# Patient Record
Sex: Female | Born: 1935 | Race: Black or African American | Hispanic: No | Smoking: Never smoker
Health system: Southern US, Community
[De-identification: ages and names within clinical notes are randomized; demographics above are authoritative.]

## PROBLEM LIST (undated history)

## (undated) DIAGNOSIS — T8859XA Other complications of anesthesia, initial encounter: Secondary | ICD-10-CM

## (undated) DIAGNOSIS — E039 Hypothyroidism, unspecified: Secondary | ICD-10-CM

## (undated) DIAGNOSIS — E7800 Pure hypercholesterolemia, unspecified: Secondary | ICD-10-CM

## (undated) DIAGNOSIS — K449 Diaphragmatic hernia without obstruction or gangrene: Secondary | ICD-10-CM

## (undated) DIAGNOSIS — F419 Anxiety disorder, unspecified: Secondary | ICD-10-CM

## (undated) DIAGNOSIS — I10 Essential (primary) hypertension: Secondary | ICD-10-CM

## (undated) DIAGNOSIS — E079 Disorder of thyroid, unspecified: Secondary | ICD-10-CM

## (undated) DIAGNOSIS — Z9889 Other specified postprocedural states: Secondary | ICD-10-CM

## (undated) DIAGNOSIS — C50919 Malignant neoplasm of unspecified site of unspecified female breast: Secondary | ICD-10-CM

## (undated) DIAGNOSIS — M1990 Unspecified osteoarthritis, unspecified site: Secondary | ICD-10-CM

## (undated) DIAGNOSIS — R112 Nausea with vomiting, unspecified: Secondary | ICD-10-CM

## (undated) DIAGNOSIS — K5790 Diverticulosis of intestine, part unspecified, without perforation or abscess without bleeding: Secondary | ICD-10-CM

## (undated) DIAGNOSIS — T4145XA Adverse effect of unspecified anesthetic, initial encounter: Secondary | ICD-10-CM

## (undated) DIAGNOSIS — C55 Malignant neoplasm of uterus, part unspecified: Secondary | ICD-10-CM

## (undated) DIAGNOSIS — J42 Unspecified chronic bronchitis: Secondary | ICD-10-CM

## (undated) DIAGNOSIS — K219 Gastro-esophageal reflux disease without esophagitis: Secondary | ICD-10-CM

## (undated) DIAGNOSIS — J40 Bronchitis, not specified as acute or chronic: Secondary | ICD-10-CM

## (undated) HISTORY — PX: APPENDECTOMY: SHX54

## (undated) HISTORY — PX: ABDOMINAL HYSTERECTOMY: SHX81

## (undated) HISTORY — DX: Diverticulosis of intestine, part unspecified, without perforation or abscess without bleeding: K57.90

## (undated) HISTORY — DX: Unspecified osteoarthritis, unspecified site: M19.90

## (undated) HISTORY — DX: Disorder of thyroid, unspecified: E07.9

## (undated) HISTORY — PX: EYE SURGERY: SHX253

## (undated) HISTORY — PX: BREAST BIOPSY: SHX20

## (undated) HISTORY — PX: GLAUCOMA SURGERY: SHX656

## (undated) HISTORY — DX: Essential (primary) hypertension: I10

## (undated) HISTORY — PX: MASTECTOMY: SHX3

## (undated) HISTORY — DX: Diaphragmatic hernia without obstruction or gangrene: K44.9

---

## 2003-04-04 ENCOUNTER — Emergency Department (HOSPITAL_COMMUNITY): Admission: EM | Admit: 2003-04-04 | Discharge: 2003-04-04 | Payer: Self-pay | Admitting: Emergency Medicine

## 2003-09-10 ENCOUNTER — Inpatient Hospital Stay (HOSPITAL_COMMUNITY): Admission: RE | Admit: 2003-09-10 | Discharge: 2003-09-14 | Payer: Self-pay | Admitting: Orthopedic Surgery

## 2005-03-30 HISTORY — PX: TOTAL KNEE ARTHROPLASTY: SHX125

## 2009-07-06 ENCOUNTER — Inpatient Hospital Stay (HOSPITAL_COMMUNITY): Admission: EM | Admit: 2009-07-06 | Discharge: 2009-07-09 | Payer: Self-pay | Admitting: Emergency Medicine

## 2010-01-16 ENCOUNTER — Ambulatory Visit: Payer: Self-pay | Admitting: Vascular Surgery

## 2010-01-16 ENCOUNTER — Inpatient Hospital Stay (HOSPITAL_COMMUNITY): Admission: RE | Admit: 2010-01-16 | Discharge: 2010-01-21 | Payer: Self-pay | Admitting: Orthopedic Surgery

## 2010-01-16 HISTORY — PX: PATCH ANGIOPLASTY: SHX6230

## 2010-01-17 ENCOUNTER — Encounter (INDEPENDENT_AMBULATORY_CARE_PROVIDER_SITE_OTHER): Payer: Self-pay | Admitting: Orthopedic Surgery

## 2010-01-21 ENCOUNTER — Encounter (INDEPENDENT_AMBULATORY_CARE_PROVIDER_SITE_OTHER): Payer: Self-pay | Admitting: Orthopedic Surgery

## 2010-02-07 ENCOUNTER — Ambulatory Visit: Payer: Self-pay | Admitting: Vascular Surgery

## 2010-06-11 LAB — BASIC METABOLIC PANEL
BUN: 15 mg/dL (ref 6–23)
BUN: 16 mg/dL (ref 6–23)
BUN: 20 mg/dL (ref 6–23)
CO2: 29 mEq/L (ref 19–32)
CO2: 30 mEq/L (ref 19–32)
CO2: 31 mEq/L (ref 19–32)
Calcium: 7.3 mg/dL — ABNORMAL LOW (ref 8.4–10.5)
Calcium: 7.6 mg/dL — ABNORMAL LOW (ref 8.4–10.5)
Calcium: 7.9 mg/dL — ABNORMAL LOW (ref 8.4–10.5)
Chloride: 102 mEq/L (ref 96–112)
Chloride: 103 mEq/L (ref 96–112)
Chloride: 104 mEq/L (ref 96–112)
Creatinine, Ser: 1.16 mg/dL (ref 0.4–1.2)
Creatinine, Ser: 1.22 mg/dL — ABNORMAL HIGH (ref 0.4–1.2)
Creatinine, Ser: 1.38 mg/dL — ABNORMAL HIGH (ref 0.4–1.2)
GFR calc Af Amer: 44 mL/min — ABNORMAL LOW (ref >60)
GFR calc Af Amer: 51 mL/min — ABNORMAL LOW (ref >60)
GFR calc Af Amer: 54 mL/min — ABNORMAL LOW (ref >60)
GFR calc non Af Amer: 37 mL/min — ABNORMAL LOW (ref >60)
GFR calc non Af Amer: 42 mL/min — ABNORMAL LOW (ref >60)
GFR calc non Af Amer: 45 mL/min — ABNORMAL LOW (ref >60)
Glucose, Bld: 132 mg/dL — ABNORMAL HIGH (ref 70–99)
Glucose, Bld: 135 mg/dL — ABNORMAL HIGH (ref 70–99)
Glucose, Bld: 144 mg/dL — ABNORMAL HIGH (ref 70–99)
Potassium: 3.4 mEq/L — ABNORMAL LOW (ref 3.5–5.1)
Potassium: 3.7 mEq/L (ref 3.5–5.1)
Potassium: 3.9 mEq/L (ref 3.5–5.1)
Sodium: 135 mEq/L (ref 135–145)
Sodium: 135 mEq/L (ref 135–145)
Sodium: 139 mEq/L (ref 135–145)

## 2010-06-11 LAB — CROSSMATCH
ABO/RH(D): A POS
Antibody Screen: NEGATIVE
Unit division: 0
Unit division: 0
Unit division: 0
Unit division: 0

## 2010-06-11 LAB — URINALYSIS, ROUTINE W REFLEX MICROSCOPIC
Bilirubin Urine: NEGATIVE
Glucose, UA: NEGATIVE mg/dL
Hgb urine dipstick: NEGATIVE
Ketones, ur: NEGATIVE mg/dL
Nitrite: NEGATIVE
Protein, ur: NEGATIVE mg/dL
Specific Gravity, Urine: 1.016 (ref 1.005–1.030)
Urobilinogen, UA: 0.2 mg/dL (ref 0.0–1.0)
pH: 6 (ref 5.0–8.0)

## 2010-06-11 LAB — CBC
HCT: 24.5 % — ABNORMAL LOW (ref 36.0–46.0)
HCT: 28.6 % — ABNORMAL LOW (ref 36.0–46.0)
HCT: 28.9 % — ABNORMAL LOW (ref 36.0–46.0)
HCT: 32.2 % — ABNORMAL LOW (ref 36.0–46.0)
HCT: 42.6 % (ref 36.0–46.0)
Hemoglobin: 11.1 g/dL — ABNORMAL LOW (ref 12.0–15.0)
Hemoglobin: 14.7 g/dL (ref 12.0–15.0)
Hemoglobin: 8.5 g/dL — ABNORMAL LOW (ref 12.0–15.0)
Hemoglobin: 9.7 g/dL — ABNORMAL LOW (ref 12.0–15.0)
Hemoglobin: 9.8 g/dL — ABNORMAL LOW (ref 12.0–15.0)
MCH: 30.6 pg (ref 26.0–34.0)
MCH: 30.9 pg (ref 26.0–34.0)
MCH: 31.4 pg (ref 26.0–34.0)
MCH: 31.4 pg (ref 26.0–34.0)
MCH: 31.5 pg (ref 26.0–34.0)
MCHC: 33.8 g/dL (ref 30.0–36.0)
MCHC: 34 g/dL (ref 30.0–36.0)
MCHC: 34.4 g/dL (ref 30.0–36.0)
MCHC: 34.5 g/dL (ref 30.0–36.0)
MCHC: 34.6 g/dL (ref 30.0–36.0)
MCV: 89.7 fL (ref 78.0–100.0)
MCV: 90.4 fL (ref 78.0–100.0)
MCV: 90.9 fL (ref 78.0–100.0)
MCV: 91.3 fL (ref 78.0–100.0)
MCV: 92.2 fL (ref 78.0–100.0)
Platelets: 137 10*3/uL — ABNORMAL LOW (ref 150–400)
Platelets: 140 10*3/uL — ABNORMAL LOW (ref 150–400)
Platelets: 159 10*3/uL (ref 150–400)
Platelets: 172 10*3/uL (ref 150–400)
Platelets: 180 10*3/uL (ref 150–400)
RBC: 2.68 MIL/uL — ABNORMAL LOW (ref 3.87–5.11)
RBC: 3.1 MIL/uL — ABNORMAL LOW (ref 3.87–5.11)
RBC: 3.2 MIL/uL — ABNORMAL LOW (ref 3.87–5.11)
RBC: 3.55 MIL/uL — ABNORMAL LOW (ref 3.87–5.11)
RBC: 4.75 MIL/uL (ref 3.87–5.11)
RDW: 14.2 % (ref 11.5–15.5)
RDW: 14.5 % (ref 11.5–15.5)
RDW: 14.6 % (ref 11.5–15.5)
RDW: 14.8 % (ref 11.5–15.5)
RDW: 15.5 % (ref 11.5–15.5)
WBC: 5.7 10*3/uL (ref 4.0–10.5)
WBC: 6.8 10*3/uL (ref 4.0–10.5)
WBC: 7.4 10*3/uL (ref 4.0–10.5)
WBC: 7.4 10*3/uL (ref 4.0–10.5)
WBC: 8.8 10*3/uL (ref 4.0–10.5)

## 2010-06-11 LAB — URINALYSIS, MICROSCOPIC ONLY
Bilirubin Urine: NEGATIVE
Glucose, UA: NEGATIVE mg/dL
Hgb urine dipstick: NEGATIVE
Ketones, ur: NEGATIVE mg/dL
Leukocytes, UA: NEGATIVE
Nitrite: NEGATIVE
Protein, ur: NEGATIVE mg/dL
Specific Gravity, Urine: 1.017 (ref 1.005–1.030)
Urobilinogen, UA: 1 mg/dL (ref 0.0–1.0)
pH: 6 (ref 5.0–8.0)

## 2010-06-11 LAB — URINE MICROSCOPIC-ADD ON

## 2010-06-11 LAB — DIFFERENTIAL
Basophils Absolute: 0 10*3/uL (ref 0.0–0.1)
Basophils Relative: 0 % (ref 0–1)
Eosinophils Absolute: 0.1 10*3/uL (ref 0.0–0.7)
Eosinophils Relative: 1 % (ref 0–5)
Lymphocytes Relative: 25 % (ref 12–46)
Lymphs Abs: 1.4 10*3/uL (ref 0.7–4.0)
Monocytes Absolute: 0.4 10*3/uL (ref 0.1–1.0)
Monocytes Relative: 7 % (ref 3–12)
Neutro Abs: 3.8 10*3/uL (ref 1.7–7.7)
Neutrophils Relative %: 67 % (ref 43–77)

## 2010-06-11 LAB — SURGICAL PCR SCREEN
MRSA, PCR: NEGATIVE
Staphylococcus aureus: NEGATIVE

## 2010-06-11 LAB — PREPARE RBC (CROSSMATCH)

## 2010-06-11 LAB — COMPREHENSIVE METABOLIC PANEL
ALT: 16 U/L (ref 0–35)
AST: 18 U/L (ref 0–37)
Albumin: 3.6 g/dL (ref 3.5–5.2)
Alkaline Phosphatase: 78 U/L (ref 39–117)
BUN: 14 mg/dL (ref 6–23)
CO2: 30 mEq/L (ref 19–32)
Calcium: 9.3 mg/dL (ref 8.4–10.5)
Chloride: 104 mEq/L (ref 96–112)
Creatinine, Ser: 1.18 mg/dL (ref 0.4–1.2)
GFR calc Af Amer: 53 mL/min — ABNORMAL LOW (ref >60)
GFR calc non Af Amer: 44 mL/min — ABNORMAL LOW (ref >60)
Glucose, Bld: 102 mg/dL — ABNORMAL HIGH (ref 70–99)
Potassium: 3.7 mEq/L (ref 3.5–5.1)
Sodium: 140 mEq/L (ref 135–145)
Total Bilirubin: 0.4 mg/dL (ref 0.3–1.2)
Total Protein: 6.9 g/dL (ref 6.0–8.3)

## 2010-06-11 LAB — PROTIME-INR
INR: 0.93 (ref 0.00–1.49)
Prothrombin Time: 12.7 seconds (ref 11.6–15.2)

## 2010-06-18 LAB — COMPREHENSIVE METABOLIC PANEL
ALT: 13 U/L (ref 0–35)
AST: 16 U/L (ref 0–37)
Albumin: 3.4 g/dL — ABNORMAL LOW (ref 3.5–5.2)
Alkaline Phosphatase: 57 U/L (ref 39–117)
BUN: 25 mg/dL — ABNORMAL HIGH (ref 6–23)
CO2: 27 mEq/L (ref 19–32)
Calcium: 8.5 mg/dL (ref 8.4–10.5)
Chloride: 108 mEq/L (ref 96–112)
Creatinine, Ser: 0.97 mg/dL (ref 0.4–1.2)
GFR calc Af Amer: >60 mL/min (ref >60)
GFR calc non Af Amer: 55 mL/min — ABNORMAL LOW (ref >60)
Glucose, Bld: 131 mg/dL — ABNORMAL HIGH (ref 70–99)
Potassium: 3.9 mEq/L (ref 3.5–5.1)
Sodium: 138 mEq/L (ref 135–145)
Total Bilirubin: 0.7 mg/dL (ref 0.3–1.2)
Total Protein: 6 g/dL (ref 6.0–8.3)

## 2010-06-18 LAB — HEMOGLOBIN A1C
Hgb A1c MFr Bld: 6.6 % — ABNORMAL HIGH (ref 4.6–6.1)
Mean Plasma Glucose: 143 mg/dL

## 2010-06-18 LAB — CBC
HCT: 27 % — ABNORMAL LOW (ref 36.0–46.0)
HCT: 27.7 % — ABNORMAL LOW (ref 36.0–46.0)
HCT: 37.6 % (ref 36.0–46.0)
Hemoglobin: 12.6 g/dL (ref 12.0–15.0)
Hemoglobin: 9.4 g/dL — ABNORMAL LOW (ref 12.0–15.0)
MCHC: 33.6 g/dL (ref 30.0–36.0)
MCHC: 33.8 g/dL (ref 30.0–36.0)
MCHC: 33.9 g/dL (ref 30.0–36.0)
MCV: 93.9 fL (ref 78.0–100.0)
MCV: 94.4 fL (ref 78.0–100.0)
MCV: 95.1 fL (ref 78.0–100.0)
Platelets: 141 10*3/uL — ABNORMAL LOW (ref 150–400)
Platelets: 147 10*3/uL — ABNORMAL LOW (ref 150–400)
Platelets: 164 10*3/uL (ref 150–400)
Platelets: 201 10*3/uL (ref 150–400)
RBC: 3.37 MIL/uL — ABNORMAL LOW (ref 3.87–5.11)
RBC: 3.99 MIL/uL (ref 3.87–5.11)
RDW: 13 % (ref 11.5–15.5)
RDW: 13.1 % (ref 11.5–15.5)
RDW: 13.4 % (ref 11.5–15.5)
WBC: 5.4 10*3/uL (ref 4.0–10.5)
WBC: 6.3 10*3/uL (ref 4.0–10.5)
WBC: 6.6 10*3/uL (ref 4.0–10.5)

## 2010-06-18 LAB — DIFFERENTIAL
Basophils Absolute: 0 10*3/uL (ref 0.0–0.1)
Basophils Relative: 0 % (ref 0–1)
Eosinophils Absolute: 0 10*3/uL (ref 0.0–0.7)
Eosinophils Relative: 1 % (ref 0–5)
Lymphocytes Relative: 21 % (ref 12–46)
Lymphs Abs: 1.4 10*3/uL (ref 0.7–4.0)
Monocytes Absolute: 0.4 10*3/uL (ref 0.1–1.0)
Monocytes Relative: 6 % (ref 3–12)
Neutro Abs: 4.8 10*3/uL (ref 1.7–7.7)
Neutrophils Relative %: 72 % (ref 43–77)

## 2010-06-18 LAB — BASIC METABOLIC PANEL
BUN: 26 mg/dL — ABNORMAL HIGH (ref 6–23)
Calcium: 8.1 mg/dL — ABNORMAL LOW (ref 8.4–10.5)
Creatinine, Ser: 1.08 mg/dL (ref 0.4–1.2)
GFR calc Af Amer: 59 mL/min — ABNORMAL LOW (ref >60)

## 2010-06-18 LAB — TYPE AND SCREEN: ABO/RH(D): A POS

## 2010-06-18 LAB — HEMOGLOBIN AND HEMATOCRIT, BLOOD
HCT: 30.3 % — ABNORMAL LOW (ref 36.0–46.0)
HCT: 34.8 % — ABNORMAL LOW (ref 36.0–46.0)
Hemoglobin: 10 g/dL — ABNORMAL LOW (ref 12.0–15.0)
Hemoglobin: 10.1 g/dL — ABNORMAL LOW (ref 12.0–15.0)

## 2010-06-18 LAB — ABO/RH: ABO/RH(D): A POS

## 2010-06-18 LAB — MAGNESIUM: Magnesium: 2 mg/dL (ref 1.5–2.5)

## 2010-06-18 LAB — PREPARE RBC (CROSSMATCH)

## 2010-06-18 LAB — PROTIME-INR: Prothrombin Time: 13.8 seconds (ref 11.6–15.2)

## 2010-08-12 NOTE — Assessment & Plan Note (Signed)
OFFICE VISIT   [name], [name]  DOB:  1925-12-25                                       02/07/2010  CHART#:[id number]   This is a postop followup.   HISTORY OF PRESENT ILLNESS:  This is an 77 year old female that I did an  intraoperative consult on for an iatrogenic injury of the right  popliteal artery during a total knee replacement on the right side.  This required a patch angioplasty of the right popliteal artery.  Since  her previous surgery she has continued to proceed with inpatient rehab.  At this point she is able to ambulate with some assistance.  She never  has developed any type of claudication or rest pain symptoms during this  entirety of her hospitalization and rehab.  She has not noticed any  problems of temperature in the foot or any disturbance of sensation.   PHYSICAL EXAMINATION:  Vital signs:  Temperature 98.0, blood pressure  148/76, heart rate of 65, respirations were 12.  On targeted examination  she has an easily palpable popliteal pulse, dorsalis pedis pulse and  posterior tibial pulses, much weaker than the dorsalis pedis but still  is present.  There is edema throughout this right leg and postsurgical  changes.  She does have sensation in her right foot and has 5/5 plantar  flexion and dorsal flexion strength though this does cause her pain with  testing.   MEDICAL DECISION MAKING:  This is an 77 year old female now status post  a right popliteal artery repair with patch angioplasty in a setting of  iatrogenic injury during a total knee replacement.  At this point she  demonstrates good pedal pulses and no evidence of any residual problems  from the iatrogenic injury.  My plan is to have her follow up in 1 year  with ABIs and arterial duplex to verify patency of the popliteal artery  and also evaluate for any type of aneurysmal degeneration at the trauma  site.  I discussed this with the patient and she is amendable to such.  Additionally please note that done during her hospitalization she  already had postoperative ABIs completed which were normal.  So repeat  of ABIs in this time is not necessary.     Leonides Sake, MD  Electronically Signed   BC/MEDQ  D:  02/07/2010  T:  02/10/2010  Job:  [id number]
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
## 2010-08-15 NOTE — Op Note (Signed)
[name], [name]                           ACCOUNT NO.:  [id number]   MEDICAL RECORD NO.:  [id number]                   PATIENT TYPE:  INP   LOCATION:  0003                                 FACILITY:  [facility]   PHYSICIAN:  John L. Rendall III, M.D.           DATE OF BIRTH:  November 22, 1925   DATE OF PROCEDURE:  09/10/2003  DATE OF DISCHARGE:                                 OPERATIVE REPORT   PREOPERATIVE DIAGNOSIS:  Osteoarthritis, left knee.   SURGICAL PROCEDURE:  Left LCS total knee replacement, fully cemented.   POSTOPERATIVE DIAGNOSIS:  Osteoarthritis, left knee.   SURGEON:  John L. Rendall, M.D.   ASSISTANT:  Richardean Canal, P.A.   ANESTHESIA:  Spinal.   PATHOLOGY:  Bone against bone with a mild varus deformity, left knee.  Chronic pain, unremitting with conservative measures.   PROCEDURE:  Under spinal anesthesia, the left leg is prepared with Duraprep  and draped as a sterile field.  A proximal sterile tourniquet is used, the  leg is wrapped out with the Esmarch, and the tourniquet is used at 350 mm.  A midline incision is made, the patella is everted.  The femur is sized at a  standard.  Using the first tibial guide, a proximal tibial resection is  carried out.  Using the first femoral guide, an intercondylar drill hole is  placed.  Using the second guide, anterior and posterior flare of the distal  femur are resected with a 10 mm flexion gap.  Recessing guide was then used.  Following this a lamina spreader is inserted, remnants of the menisci and  cruciates are resected along with huge osteophytes on the back of the  femoral condyles.  Once this is all debrided and cleaned out, the tibia is  exposed with a McCale and Hohmann.  It is sized at a #3, a center peg hole  with keel is placed.  Trial reduction of a 3 tibia, 10 bearing, standard  femur reveals good fit and alignment.  The patella is osteotomized, a three  peg hole standard patella is used.  Towel clips are used to  close the medial  retinaculum.  The knee has good stability and excellent range of motion.  Permanent components are then obtained while bony surfaces are prepared with  pulse irrigation.  All components are cemented in place.  A synovectomy is  carried out in the suprapatellar pouch while cement hardens.  After cement  has hardened, excess is removed with osteotome and mallet.  Tourniquet is  let down at 54 minutes.  The knee is then closed in layers with #1 Tycron, 0  and 2-0 Vicryl, and skin clips.  Operative time approximately 1 hour 15  minutes.  The patient tolerated the procedure well and returned to recovery  in good condition.  John L. Dorothyann Gibbs, M.D.    Renato Gails  D:  09/10/2003  T:  09/10/2003  Job:  [id number]

## 2010-12-16 ENCOUNTER — Encounter: Payer: Self-pay | Admitting: Vascular Surgery

## 2011-01-17 HISTORY — PX: TOTAL KNEE ARTHROPLASTY: SHX125

## 2011-02-05 ENCOUNTER — Encounter: Payer: Self-pay | Admitting: Vascular Surgery

## 2011-02-06 ENCOUNTER — Ambulatory Visit: Payer: Self-pay | Admitting: Vascular Surgery

## 2011-03-19 ENCOUNTER — Encounter: Payer: Self-pay | Admitting: Vascular Surgery

## 2011-03-20 ENCOUNTER — Ambulatory Visit (INDEPENDENT_AMBULATORY_CARE_PROVIDER_SITE_OTHER): Payer: Medicare Other | Admitting: *Deleted

## 2011-03-20 ENCOUNTER — Ambulatory Visit: Payer: Medicare Other | Admitting: Vascular Surgery

## 2011-03-20 ENCOUNTER — Other Ambulatory Visit (INDEPENDENT_AMBULATORY_CARE_PROVIDER_SITE_OTHER): Payer: Medicare Other | Admitting: *Deleted

## 2011-03-20 DIAGNOSIS — I739 Peripheral vascular disease, unspecified: Secondary | ICD-10-CM

## 2011-03-20 DIAGNOSIS — Z48812 Encounter for surgical aftercare following surgery on the circulatory system: Secondary | ICD-10-CM

## 2011-03-26 NOTE — Procedures (Unsigned)
LOWER EXTREMITY ARTERIAL DUPLEX  INDICATION:  Follow up right popliteal artery repair.  HISTORY: Diabetes:  No Cardiac: Hypertension:  Yes Smoking:  No Previous Surgery:  Right popliteal artery repair 01/16/2010  SINGLE LEVEL ARTERIAL EXAM                         RIGHT                LEFT Brachial:               173                  170 Anterior tibial:        208                  145 Posterior tibial:       191                  206 Peroneal: Ankle/Brachial Index:   1.20                 1.19  LOWER EXTREMITY ARTERIAL DUPLEX EXAM  DUPLEX:  Patent right lower extremity popliteal artery with triphasic wave forms noted throughout.  IMPRESSION: 1. Bilateral ankle-brachial indices are within normal limits. 2. Patent right popliteal artery with velocity measurement shown on     the following worksheet.  ___________________________________________ Fransisco Hertz, MD  EM/MEDQ  D:  03/26/2011  T:  03/26/2011  Job:  [id number]

## 2012-01-06 ENCOUNTER — Encounter: Payer: Self-pay | Admitting: Vascular Surgery

## 2012-01-27 ENCOUNTER — Other Ambulatory Visit: Payer: Self-pay | Admitting: *Deleted

## 2012-01-27 DIAGNOSIS — I724 Aneurysm of artery of lower extremity: Secondary | ICD-10-CM

## 2012-01-27 DIAGNOSIS — I739 Peripheral vascular disease, unspecified: Secondary | ICD-10-CM

## 2012-01-27 DIAGNOSIS — Z48812 Encounter for surgical aftercare following surgery on the circulatory system: Secondary | ICD-10-CM

## 2012-01-29 ENCOUNTER — Ambulatory Visit: Payer: Medicare Other | Admitting: Neurosurgery

## 2012-02-05 ENCOUNTER — Encounter: Payer: Self-pay | Admitting: Neurosurgery

## 2012-02-08 ENCOUNTER — Ambulatory Visit: Payer: Medicare Other | Admitting: Neurosurgery

## 2012-03-03 ENCOUNTER — Other Ambulatory Visit (HOSPITAL_COMMUNITY): Payer: Self-pay | Admitting: Orthopaedic Surgery

## 2012-03-03 DIAGNOSIS — L089 Local infection of the skin and subcutaneous tissue, unspecified: Secondary | ICD-10-CM

## 2012-03-04 ENCOUNTER — Encounter (HOSPITAL_COMMUNITY): Payer: Self-pay | Admitting: Pharmacy Technician

## 2012-03-07 NOTE — Progress Notes (Addendum)
I was unable to reach on the phone.  I left a voice message on cell phone.  Arrive at 1030, [facility], NPO after Midnight.  Take Synthyroid with a sip of water.  May take Ultram if needed.  I called her contact person, there was no answer.

## 2012-03-07 NOTE — H&P (Signed)
[name], MD   Jacqualine Code, PA-C [address] [place], [place]  [address]                             [phone]   ORTHOPAEDIC HISTORY & PHYSICAL  [name] MRN:  [id number] DOB/SEX:  06/14/1925/female  CHIEF COMPLAINT:  Painful right total knee replacement  HISTORY: Patient is a 78 y.o. female presented with a history of pain in the right knee. She had a total knee replacement placed in October 2009 by Dr. Erasmo Leventhal.   She has had problems with motion with this at time.  She had last seen him in April 2012.  At that time she had been using tramadol intermittently for pain at least 2-3 times per day.  She, unfortunately at the time of her surgery, had an injury to the popliteal artery and it was evaluated by Dr. Imogene Burn from vascular surgery.  Apparently there was a hole in the lateral wall of the popliteal artery.  Dr. Imogene Burn and Dr. Myra Gianotti did a bovine patch graft.  She ended up, unfortunately, having arthrofibrosis postop and did have to have manipulation.  She, unfortunately, has never really gone past 80 or so degrees.  She states that overall she was tolerating this pain, however, on Sunday 02/28/2012 she was visiting a friend in the hospital and stood up and had pain in the posterior and lateral aspect of the knee.  There is no known injury other than getting up from a chair.  She notes some swelling in the area.  It also feels warm.  She has had difficulty weightbearing and has actually increased her tramadol.  She has pain even at nighttime when trying to turn over.  She denies any neurovascular compromise.  Evaluated on 12//06/2011 and an aspiration of the joint revealed very cloudy yellow fluid which was sent for studies as well as blood analysis.  They were abundant WBC in the gram stain, but no organisms were seen on day 1.  The C-met was normal except for a slightly elevated glucose at 105, creatinine was 1.17, BUN was 19.  The white cell count was 9,000.   Hemoglobin 14.9, hematocrit reported 1.9.  The C-reactive protein was significantly elevated at 14.  The sed rate was 58.   The fluid color was yellow, it was turbid.  There was 74,373 white cells, 86% were neutrophils. The glucose was <20 .  There were no crystals.  Cultures grew staph coag negative susceptible to everything PCN.  She continues to have pain.  Admitted for I&D with poly exchange and IV antibiotics,  PAST MEDICAL HISTORY: There are no active problems to display for this patient.  Past Medical History  Diagnosis Date  . Hypertension   . Thyroid disease   . Hiatal hernia   . Arthritis   . Diverticulosis   . Cancer     breast  . Cancer     Uterine   Past Surgical History  Procedure Date  . Total knee arthroplasty     left knee  . Popliteal artery repair 01/16/10    right with patch angioplasty     MEDICATIONS:   Prescriptions prior to admission  Medication Sig Dispense Refill  . ergocalciferol (VITAMIN D2) 50000 UNITS capsule Take 50,000 Units by mouth every 30 (thirty) days. Take on the 1st day of each month.      . indapamide (LOZOL) 1.25 MG  tablet Take 1.25 mg by mouth daily.      Marland Kitchen levothyroxine (SYNTHROID, LEVOTHROID) 50 MCG tablet Take 50 mcg by mouth daily.      . methocarbamol (ROBAXIN) 500 MG tablet Take 500 mg by mouth daily as needed. For cramps.      . Naproxen-Esomeprazole (VIMOVO) 500-20 MG TBEC Take 1 tablet by mouth daily.      . pravastatin (PRAVACHOL) 40 MG tablet Take 40 mg by mouth daily.      . traMADol (ULTRAM) 50 MG tablet Take 50 mg by mouth every 6 (six) hours as needed. For pain        ALLERGIES:  No Known Allergies  REVIEW OF SYSTEMS:  A comprehensive review of systems was negative except for: Eyes: positive for contacts/glasses Ears, nose, mouth, throat, and face: positive for upper and lower partial dentures Cardiovascular: positive for hypertension Endocrine: positive for hypothyroidism Breast cancer with mastectomy on the  left.   FAMILY HISTORY:  No family history on file.  SOCIAL HISTORY:   History  Substance Use Topics  . Smoking status: Never Smoker   . Smokeless tobacco: Not on file  . Alcohol Use: No      EXAMINATION: Vital signs in last 24 hours: Temp:  [98.5 F (36.9 C)] 98.5 F (36.9 C) (12/10 1108) Pulse Rate:  [63] 63  (12/10 1108) Resp:  [18] 18  (12/10 1108) BP: (134)/(68) 134/68 mmHg (12/10 1108) SpO2:  [95 %] 95 % (12/10 1108)  Head is normocephalic.   Eyes:  Pupils equal, round and reactive to light and accommodation.  Extraocular intact. ENT: Ears, nose, and throat were benign.   Neck: supple, no bruits were noted.   Chest: good expansion.   Lungs: essentially clear.   Cardiac: regular rhythm and rate, normal S1, S2.  No murmurs appreciated. Pulses :  1+ bilateral and symmetric in lower extremities. Abdomen is scaphoid, soft, nontender, no masses palpable, normal bowel sounds                  present. CNS:  He is oriented x3 and cranial nerves II-XII grossly intact. Breast, rectal, and genital exams: not performed and not indicated for an orthopedic evaluation. Musculoskeletal: Today she has only range of motion, near full extension, to about 80 degrees.  She is tender to palpation at the posterior aspect of the knee and along the lateral aspect of the knee.  Some medial pain, but more laterally.  She appears to be a little bit valgus at this time.  The surgical wound is well healed.  She has some warmth about the knee with no erythema.    Imaging Review X-rays reveal a cemented right total knee arthroplasty.  There may be some radiolucencies under the tibial plate along the medial aspect.     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 Hypertension   . Thyroid disease   . Hiatal hernia   . Arthritis   . Diverticulosis   . Cancer     breast  . Cancer     Uterine    PLAN: Plan for  right knee I&D, synovectomy and poly exchange.  The procedure,  risks, and benefits of total knee arthroplasty were presented and reviewed. The risks including but not limited to infection, blood clots, vascular and nerve injury, stiffness,  among others were discussed. The patient acknowledged the explanation, agreed to proceed.   PETRARCA,BRIAN 03/08/2012,  2:28 PM

## 2012-03-08 ENCOUNTER — Inpatient Hospital Stay (HOSPITAL_COMMUNITY): Payer: Medicare Other | Admitting: Certified Registered Nurse Anesthetist

## 2012-03-08 ENCOUNTER — Inpatient Hospital Stay (HOSPITAL_COMMUNITY): Payer: Medicare Other

## 2012-03-08 ENCOUNTER — Encounter (HOSPITAL_COMMUNITY): Payer: Self-pay | Admitting: Certified Registered Nurse Anesthetist

## 2012-03-08 ENCOUNTER — Encounter (HOSPITAL_COMMUNITY): Payer: Self-pay | Admitting: *Deleted

## 2012-03-08 ENCOUNTER — Encounter (HOSPITAL_COMMUNITY)
Admission: RE | Disposition: A | Discharge status: Skilled Nursing Facility | Payer: Self-pay | Source: Ambulatory Visit | Attending: Orthopaedic Surgery

## 2012-03-08 ENCOUNTER — Inpatient Hospital Stay (HOSPITAL_COMMUNITY)
Admission: RE | Admit: 2012-03-08 | Discharge: 2012-03-11 | DRG: 467 | Disposition: A | Discharge status: Skilled Nursing Facility | Payer: Medicare Other | Source: Ambulatory Visit | Attending: Orthopaedic Surgery | Admitting: Orthopaedic Surgery

## 2012-03-08 DIAGNOSIS — Z853 Personal history of malignant neoplasm of breast: Secondary | ICD-10-CM

## 2012-03-08 DIAGNOSIS — Z7901 Long term (current) use of anticoagulants: Secondary | ICD-10-CM

## 2012-03-08 DIAGNOSIS — T8459XA Infection and inflammatory reaction due to other internal joint prosthesis, initial encounter: Secondary | ICD-10-CM

## 2012-03-08 DIAGNOSIS — Z8542 Personal history of malignant neoplasm of other parts of uterus: Secondary | ICD-10-CM

## 2012-03-08 DIAGNOSIS — Z96659 Presence of unspecified artificial knee joint: Secondary | ICD-10-CM

## 2012-03-08 DIAGNOSIS — I10 Essential (primary) hypertension: Secondary | ICD-10-CM | POA: Diagnosis present

## 2012-03-08 DIAGNOSIS — Y92009 Unspecified place in unspecified non-institutional (private) residence as the place of occurrence of the external cause: Secondary | ICD-10-CM

## 2012-03-08 DIAGNOSIS — T8450XA Infection and inflammatory reaction due to unspecified internal joint prosthesis, initial encounter: Principal | ICD-10-CM | POA: Diagnosis present

## 2012-03-08 DIAGNOSIS — B957 Other staphylococcus as the cause of diseases classified elsewhere: Secondary | ICD-10-CM | POA: Diagnosis present

## 2012-03-08 DIAGNOSIS — E039 Hypothyroidism, unspecified: Secondary | ICD-10-CM | POA: Diagnosis present

## 2012-03-08 DIAGNOSIS — D62 Acute posthemorrhagic anemia: Secondary | ICD-10-CM | POA: Diagnosis not present

## 2012-03-08 DIAGNOSIS — Z79899 Other long term (current) drug therapy: Secondary | ICD-10-CM

## 2012-03-08 DIAGNOSIS — Y831 Surgical operation with implant of artificial internal device as the cause of abnormal reaction of the patient, or of later complication, without mention of misadventure at the time of the procedure: Secondary | ICD-10-CM | POA: Diagnosis present

## 2012-03-08 HISTORY — PX: JOINT REPLACEMENT: SHX530

## 2012-03-08 HISTORY — DX: Other complications of anesthesia, initial encounter: T88.59XA

## 2012-03-08 HISTORY — DX: Other specified postprocedural states: Z98.890

## 2012-03-08 HISTORY — DX: Hypothyroidism, unspecified: E03.9

## 2012-03-08 HISTORY — DX: Adverse effect of unspecified anesthetic, initial encounter: T41.45XA

## 2012-03-08 HISTORY — DX: Other specified postprocedural states: R11.2

## 2012-03-08 HISTORY — PX: I & D KNEE WITH POLY EXCHANGE: SHX5024

## 2012-03-08 LAB — CBC
Hemoglobin: 14.5 g/dL (ref 12.0–15.0)
MCV: 89 fL (ref 78.0–100.0)
Platelets: 280 10*3/uL (ref 150–400)
RBC: 4.63 MIL/uL (ref 3.87–5.11)
WBC: 8.8 10*3/uL (ref 4.0–10.5)

## 2012-03-08 LAB — URINALYSIS, ROUTINE W REFLEX MICROSCOPIC
Bilirubin Urine: NEGATIVE
Hgb urine dipstick: NEGATIVE
Specific Gravity, Urine: 1.011 (ref 1.005–1.030)
pH: 7 (ref 5.0–8.0)

## 2012-03-08 LAB — COMPREHENSIVE METABOLIC PANEL
ALT: 21 U/L (ref 0–35)
AST: 18 U/L (ref 0–37)
CO2: 29 mEq/L (ref 19–32)
Chloride: 97 mEq/L (ref 96–112)
Creatinine, Ser: 1 mg/dL (ref 0.50–1.10)
GFR calc non Af Amer: 50 mL/min — ABNORMAL LOW (ref >90)
Total Bilirubin: 0.3 mg/dL (ref 0.3–1.2)

## 2012-03-08 LAB — PROTIME-INR: INR: 1.16 (ref 0.00–1.49)

## 2012-03-08 LAB — TYPE AND SCREEN: Antibody Screen: NEGATIVE

## 2012-03-08 LAB — ABO/RH: ABO/RH(D): A POS

## 2012-03-08 SURGERY — IRRIGATION AND DEBRIDEMENT KNEE WITH POLY EXCHANGE
Anesthesia: General | Site: Knee | Laterality: Right | Wound class: Dirty or Infected

## 2012-03-08 MED ORDER — CEFAZOLIN SODIUM 1-5 GM-% IV SOLN
INTRAVENOUS | Status: AC
  Filled 2012-03-08: qty 150

## 2012-03-08 MED ORDER — GENTAMICIN SULFATE 40 MG/ML IJ SOLN
INTRAMUSCULAR | Status: AC
  Filled 2012-03-08: qty 4

## 2012-03-08 MED ORDER — ACETAMINOPHEN 10 MG/ML IV SOLN
1000.0000 mg | Freq: Four times a day (QID) | INTRAVENOUS | Status: AC
  Administered 2012-03-08 – 2012-03-09 (×4): 1000 mg via INTRAVENOUS
  Filled 2012-03-08 (×4): qty 100

## 2012-03-08 MED ORDER — BISACODYL 10 MG RE SUPP
10.0000 mg | Freq: Every day | RECTAL | Status: DC | PRN
  Administered 2012-03-11: 10 mg via RECTAL
  Filled 2012-03-08: qty 1

## 2012-03-08 MED ORDER — METHOCARBAMOL 500 MG PO TABS
500.0000 mg | ORAL_TABLET | Freq: Four times a day (QID) | ORAL | Status: DC | PRN
  Administered 2012-03-09 – 2012-03-11 (×10): 500 mg via ORAL
  Filled 2012-03-08 (×11): qty 1

## 2012-03-08 MED ORDER — HYDROMORPHONE HCL PF 1 MG/ML IJ SOLN
0.5000 mg | INTRAMUSCULAR | Status: DC | PRN
  Administered 2012-03-09 (×4): 0.5 mg via INTRAVENOUS
  Filled 2012-03-08 (×5): qty 1

## 2012-03-08 MED ORDER — SODIUM CHLORIDE 0.9 % IR SOLN
Status: DC | PRN
  Administered 2012-03-08: 3000 mL

## 2012-03-08 MED ORDER — SODIUM CHLORIDE 0.9 % IV SOLN
75.0000 mL/h | INTRAVENOUS | Status: DC
  Administered 2012-03-08: 75 mL/h via INTRAVENOUS

## 2012-03-08 MED ORDER — VANCOMYCIN HCL 500 MG IV SOLR
INTRAVENOUS | Status: AC
  Filled 2012-03-08: qty 500

## 2012-03-08 MED ORDER — MAGNESIUM HYDROXIDE 400 MG/5ML PO SUSP: 30.0000 mL | Freq: Every day | ORAL | Status: DC | PRN

## 2012-03-08 MED ORDER — ONDANSETRON HCL 4 MG/2ML IJ SOLN
INTRAMUSCULAR | Status: DC | PRN
  Administered 2012-03-08: 4 mg via INTRAVENOUS

## 2012-03-08 MED ORDER — LEVOTHYROXINE SODIUM 50 MCG PO TABS
50.0000 ug | ORAL_TABLET | Freq: Every day | ORAL | Status: DC
  Administered 2012-03-09 – 2012-03-11 (×3): 50 ug via ORAL
  Filled 2012-03-08 (×4): qty 1

## 2012-03-08 MED ORDER — METHOCARBAMOL 100 MG/ML IJ SOLN
500.0000 mg | Freq: Four times a day (QID) | INTRAVENOUS | Status: DC | PRN
  Administered 2012-03-08: 500 mg via INTRAVENOUS
  Filled 2012-03-08: qty 5

## 2012-03-08 MED ORDER — ACETAMINOPHEN 10 MG/ML IV SOLN
INTRAVENOUS | Status: AC
  Filled 2012-03-08: qty 100

## 2012-03-08 MED ORDER — VANCOMYCIN HCL 500 MG IV SOLR
INTRAVENOUS | Status: DC | PRN
  Administered 2012-03-08: 500 mg

## 2012-03-08 MED ORDER — MUPIROCIN 2 % EX OINT
TOPICAL_OINTMENT | Freq: Two times a day (BID) | CUTANEOUS | Status: DC
  Administered 2012-03-08 – 2012-03-11 (×5): via NASAL
  Filled 2012-03-08: qty 22

## 2012-03-08 MED ORDER — ARTIFICIAL TEARS OP OINT
TOPICAL_OINTMENT | OPHTHALMIC | Status: DC | PRN
  Administered 2012-03-08: 1 via OPHTHALMIC

## 2012-03-08 MED ORDER — RIVAROXABAN 10 MG PO TABS
10.0000 mg | ORAL_TABLET | ORAL | Status: DC
  Administered 2012-03-09: 10 mg via ORAL
  Filled 2012-03-08 (×2): qty 1

## 2012-03-08 MED ORDER — LACTATED RINGERS IV SOLN
INTRAVENOUS | Status: DC | PRN
  Administered 2012-03-08 (×2): via INTRAVENOUS

## 2012-03-08 MED ORDER — LACTATED RINGERS IV SOLN
INTRAVENOUS | Status: DC
  Administered 2012-03-08: 14:00:00 via INTRAVENOUS

## 2012-03-08 MED ORDER — EPHEDRINE SULFATE 50 MG/ML IJ SOLN
INTRAMUSCULAR | Status: DC | PRN
  Administered 2012-03-08: 5 mg via INTRAVENOUS

## 2012-03-08 MED ORDER — MUPIROCIN 2 % EX OINT
TOPICAL_OINTMENT | CUTANEOUS | Status: AC
  Filled 2012-03-08: qty 22

## 2012-03-08 MED ORDER — FLEET ENEMA 7-19 GM/118ML RE ENEM: 1.0000 | ENEMA | Freq: Once | RECTAL | Status: AC | PRN

## 2012-03-08 MED ORDER — HYDROMORPHONE HCL PF 1 MG/ML IJ SOLN
INTRAMUSCULAR | Status: AC
  Administered 2012-03-08: 0.25 mg via INTRAVENOUS
  Filled 2012-03-08: qty 1

## 2012-03-08 MED ORDER — DOCUSATE SODIUM 100 MG PO CAPS
100.0000 mg | ORAL_CAPSULE | Freq: Two times a day (BID) | ORAL | Status: DC
  Administered 2012-03-09 – 2012-03-11 (×5): 100 mg via ORAL
  Filled 2012-03-08 (×7): qty 1

## 2012-03-08 MED ORDER — 0.9 % SODIUM CHLORIDE (POUR BTL) OPTIME
TOPICAL | Status: DC | PRN
  Administered 2012-03-08: 1000 mL

## 2012-03-08 MED ORDER — ONDANSETRON HCL 4 MG/2ML IJ SOLN: 4.0000 mg | Freq: Four times a day (QID) | INTRAMUSCULAR | Status: DC | PRN

## 2012-03-08 MED ORDER — SODIUM CHLORIDE 0.9 % IV SOLN: INTRAVENOUS | Status: DC

## 2012-03-08 MED ORDER — GLYCOPYRROLATE 0.2 MG/ML IJ SOLN
INTRAMUSCULAR | Status: DC | PRN
  Administered 2012-03-08 (×2): 0.1 mg via INTRAVENOUS

## 2012-03-08 MED ORDER — KETOROLAC TROMETHAMINE 15 MG/ML IJ SOLN
15.0000 mg | Freq: Four times a day (QID) | INTRAMUSCULAR | Status: AC
  Administered 2012-03-08 – 2012-03-09 (×2): 15 mg via INTRAVENOUS
  Filled 2012-03-08 (×2): qty 1

## 2012-03-08 MED ORDER — INDAPAMIDE 1.25 MG PO TABS
1.2500 mg | ORAL_TABLET | Freq: Every day | ORAL | Status: DC
  Administered 2012-03-08 – 2012-03-11 (×4): 1.25 mg via ORAL
  Filled 2012-03-08 (×4): qty 1

## 2012-03-08 MED ORDER — CEFAZOLIN SODIUM-DEXTROSE 2-3 GM-% IV SOLR
2.0000 g | Freq: Three times a day (TID) | INTRAVENOUS | Status: AC
  Administered 2012-03-09 – 2012-03-10 (×6): 2 g via INTRAVENOUS
  Filled 2012-03-08 (×6): qty 50

## 2012-03-08 MED ORDER — HYDROMORPHONE HCL PF 1 MG/ML IJ SOLN
0.2500 mg | INTRAMUSCULAR | Status: DC | PRN
  Administered 2012-03-08 (×2): 0.25 mg via INTRAVENOUS
  Administered 2012-03-08: 0.5 mg via INTRAVENOUS

## 2012-03-08 MED ORDER — METOCLOPRAMIDE HCL 10 MG PO TABS: 5.0000 mg | ORAL_TABLET | Freq: Three times a day (TID) | ORAL | Status: DC | PRN

## 2012-03-08 MED ORDER — THROMBIN 20000 UNITS EX KIT
PACK | CUTANEOUS | Status: AC
  Filled 2012-03-08: qty 1

## 2012-03-08 MED ORDER — FENTANYL CITRATE 0.05 MG/ML IJ SOLN
INTRAMUSCULAR | Status: DC | PRN
  Administered 2012-03-08 (×2): 25 ug via INTRAVENOUS
  Administered 2012-03-08: 100 ug via INTRAVENOUS
  Administered 2012-03-08 (×4): 25 ug via INTRAVENOUS
  Administered 2012-03-08: 100 ug via INTRAVENOUS
  Administered 2012-03-08 (×3): 50 ug via INTRAVENOUS

## 2012-03-08 MED ORDER — LIDOCAINE HCL (CARDIAC) 20 MG/ML IV SOLN
INTRAVENOUS | Status: DC | PRN
  Administered 2012-03-08: 100 mg via INTRAVENOUS

## 2012-03-08 MED ORDER — BUPIVACAINE-EPINEPHRINE (PF) 0.5% -1:200000 IJ SOLN
INTRAMUSCULAR | Status: AC
  Filled 2012-03-08: qty 10

## 2012-03-08 MED ORDER — PROPOFOL 10 MG/ML IV BOLUS
INTRAVENOUS | Status: DC | PRN
  Administered 2012-03-08: 120 mg via INTRAVENOUS

## 2012-03-08 MED ORDER — GENTAMICIN SULFATE 40 MG/ML IJ SOLN
INTRAMUSCULAR | Status: DC | PRN
  Administered 2012-03-08: 80 mg via INTRAMUSCULAR

## 2012-03-08 MED ORDER — OXYCODONE HCL 5 MG PO TABS
5.0000 mg | ORAL_TABLET | ORAL | Status: DC | PRN
  Administered 2012-03-08 – 2012-03-11 (×18): 10 mg via ORAL
  Filled 2012-03-08 (×18): qty 2

## 2012-03-08 MED ORDER — ALUM & MAG HYDROXIDE-SIMETH 200-200-20 MG/5ML PO SUSP: 30.0000 mL | ORAL | Status: DC | PRN

## 2012-03-08 MED ORDER — MENTHOL 3 MG MT LOZG: 1.0000 | LOZENGE | OROMUCOSAL | Status: DC | PRN

## 2012-03-08 MED ORDER — PHENOL 1.4 % MT LIQD: 1.0000 | OROMUCOSAL | Status: DC | PRN

## 2012-03-08 MED ORDER — CEFAZOLIN SODIUM 1-5 GM-% IV SOLN
INTRAVENOUS | Status: DC | PRN
  Administered 2012-03-08 (×3): 1 g via INTRAVENOUS

## 2012-03-08 MED ORDER — ACETAMINOPHEN 10 MG/ML IV SOLN
1000.0000 mg | Freq: Once | INTRAVENOUS | Status: AC
  Administered 2012-03-08: 1000 mg via INTRAVENOUS
  Filled 2012-03-08: qty 100

## 2012-03-08 MED ORDER — METOCLOPRAMIDE HCL 5 MG/ML IJ SOLN: 5.0000 mg | Freq: Three times a day (TID) | INTRAMUSCULAR | Status: DC | PRN

## 2012-03-08 MED ORDER — ONDANSETRON HCL 4 MG PO TABS
4.0000 mg | ORAL_TABLET | Freq: Four times a day (QID) | ORAL | Status: DC | PRN
  Administered 2012-03-09: 4 mg via ORAL
  Filled 2012-03-08: qty 1

## 2012-03-08 MED ORDER — BUPIVACAINE-EPINEPHRINE PF 0.5-1:200000 % IJ SOLN
INTRAMUSCULAR | Status: DC | PRN
  Administered 2012-03-08: 30 mL

## 2012-03-08 SURGICAL SUPPLY — 49 items
BAG DECANTER FOR FLEXI CONT (MISCELLANEOUS) ×1 IMPLANT
BANDAGE ELASTIC 4 VELCRO ST LF (GAUZE/BANDAGES/DRESSINGS) ×1 IMPLANT
BANDAGE GAUZE ELAST BULKY 4 IN (GAUZE/BANDAGES/DRESSINGS) ×1 IMPLANT
BNDG COHESIVE 4X5 TAN STRL (GAUZE/BANDAGES/DRESSINGS) ×1 IMPLANT
CLOTH BEACON ORANGE TIMEOUT ST (SAFETY) ×2 IMPLANT
CUFF TOURNIQUET SINGLE 34IN LL (TOURNIQUET CUFF) ×1 IMPLANT
CUFF TOURNIQUET SINGLE 44IN (TOURNIQUET CUFF) IMPLANT
DRSG ADAPTIC 3X8 NADH LF (GAUZE/BANDAGES/DRESSINGS) ×2 IMPLANT
DRSG EMULSION OIL 3X3 NADH (GAUZE/BANDAGES/DRESSINGS) ×1 IMPLANT
DRSG PAD ABDOMINAL 8X10 ST (GAUZE/BANDAGES/DRESSINGS) ×3 IMPLANT
DURAPREP 26ML APPLICATOR (WOUND CARE) ×2 IMPLANT
ELECT REM PT RETURN 9FT ADLT (ELECTROSURGICAL) ×2
ELECTRODE REM PT RTRN 9FT ADLT (ELECTROSURGICAL) IMPLANT
EVACUATOR 1/8 PVC DRAIN (DRAIN) ×1 IMPLANT
GLOVE BIOGEL PI IND STRL 8 (GLOVE) ×1 IMPLANT
GLOVE BIOGEL PI INDICATOR 8 (GLOVE) ×1
GLOVE ECLIPSE 8.0 STRL XLNG CF (GLOVE) ×2 IMPLANT
GOWN STRL NON-REIN LRG LVL3 (GOWN DISPOSABLE) ×4 IMPLANT
HANDPIECE INTERPULSE COAX TIP (DISPOSABLE) ×2
INSERT LCS 3 PEG STD (Knees) ×1 IMPLANT
INSERT TIB LCS RP STD+ 12.5 (Knees) ×1 IMPLANT
KIT BASIN OR (CUSTOM PROCEDURE TRAY) ×2 IMPLANT
KIT ROOM TURNOVER OR (KITS) ×2 IMPLANT
KIT STIMULAN RAPID CURE 5CC (Orthopedic Implant) ×1 IMPLANT
MANIFOLD NEPTUNE II (INSTRUMENTS) ×2 IMPLANT
NS IRRIG 1000ML POUR BTL (IV SOLUTION) ×2 IMPLANT
PACK ORTHO EXTREMITY (CUSTOM PROCEDURE TRAY) ×1 IMPLANT
PACK TOTAL JOINT (CUSTOM PROCEDURE TRAY) ×1 IMPLANT
PAD ARMBOARD 7.5X6 YLW CONV (MISCELLANEOUS) ×4 IMPLANT
PAD CAST 4YDX4 CTTN HI CHSV (CAST SUPPLIES) IMPLANT
PADDING CAST COTTON 4X4 STRL (CAST SUPPLIES) ×2
PADDING CAST COTTON 6X4 STRL (CAST SUPPLIES) ×1 IMPLANT
PENCIL BUTTON HOLSTER BLD 10FT (ELECTRODE) IMPLANT
RUBBERBAND STERILE (MISCELLANEOUS) ×2 IMPLANT
SET HNDPC FAN SPRY TIP SCT (DISPOSABLE) IMPLANT
SPONGE GAUZE 4X4 12PLY (GAUZE/BANDAGES/DRESSINGS) ×2 IMPLANT
SPONGE LAP 18X18 X RAY DECT (DISPOSABLE) ×1 IMPLANT
SPONGE LAP 4X18 X RAY DECT (DISPOSABLE) ×1 IMPLANT
STOCKINETTE IMPERVIOUS 9X36 MD (GAUZE/BANDAGES/DRESSINGS) ×1 IMPLANT
SUT ETHILON 3 0 PS 1 (SUTURE) IMPLANT
SUT PDS AB 1 CT  36 (SUTURE) ×3
SUT PDS AB 1 CT 36 (SUTURE) IMPLANT
TOWEL OR 17X24 6PK STRL BLUE (TOWEL DISPOSABLE) ×2 IMPLANT
TOWEL OR 17X26 10 PK STRL BLUE (TOWEL DISPOSABLE) ×2 IMPLANT
TUBE ANAEROBIC SPECIMEN COL (MISCELLANEOUS) ×1 IMPLANT
TUBE CONNECTING 12X1/4 (SUCTIONS) ×1 IMPLANT
UNDERPAD 30X30 INCONTINENT (UNDERPADS AND DIAPERS) ×2 IMPLANT
WATER STERILE IRR 1000ML POUR (IV SOLUTION) ×1 IMPLANT
YANKAUER SUCT BULB TIP NO VENT (SUCTIONS) ×1 IMPLANT

## 2012-03-08 NOTE — Preoperative (Signed)
Beta Blockers   Reason not to administer Beta Blockers:Not Applicable 

## 2012-03-08 NOTE — Anesthesia Procedure Notes (Signed)
Procedure Name: LMA Insertion Date/Time: 03/08/2012 3:04 PM Performed by: Margaree Mackintosh Pre-anesthesia Checklist: Patient identified, Timeout performed, Emergency Drugs available, Suction available and Patient being monitored Patient Re-evaluated:Patient Re-evaluated prior to inductionOxygen Delivery Method: Circle system utilized Preoxygenation: Pre-oxygenation with 100% oxygen Intubation Type: IV induction LMA: LMA inserted LMA Size: 4.0 Number of attempts: 1 Placement Confirmation: positive ETCO2 and breath sounds checked- equal and bilateral Tube secured with: Tape Dental Injury: Teeth and Oropharynx as per pre-operative assessment

## 2012-03-08 NOTE — Anesthesia Postprocedure Evaluation (Signed)
  Anesthesia Post-op Note  Patient: [name]  Procedure(s) Performed: Procedure(s) (LRB) with comments: IRRIGATION AND DEBRIDEMENT KNEE WITH POLY EXCHANGE (Right) - Right Total Knee Irrigation & Debridement, Synovectomy, Poly Exchange, Placement of Antibiotic Beads  Patient Location: PACU  Anesthesia Type:GA combined with regional for post-op pain  Level of Consciousness: awake  Airway and Oxygen Therapy: Patient Spontanous Breathing  Post-op Pain: mild  Post-op Assessment: Post-op Vital signs reviewed, Patient's Cardiovascular Status Stable, Respiratory Function Stable, Patent Airway, No signs of Nausea or vomiting and Pain level controlled  Post-op Vital Signs: stable  Complications: No apparent anesthesia complications

## 2012-03-08 NOTE — Anesthesia Preprocedure Evaluation (Signed)
Anesthesia Evaluation  Patient identified by MRN, date of birth, ID band Patient awake    Reviewed: Allergy & Precautions, H&P , NPO status , Patient's Chart, lab work & pertinent test results  Airway Mallampati: II      Dental   Pulmonary  breath sounds clear to auscultation        Cardiovascular hypertension, Pt. on medications Rhythm:Regular Rate:Normal     Neuro/Psych    GI/Hepatic Neg liver ROS,   Endo/Other  negative endocrine ROS  Renal/GU negative Renal ROS     Musculoskeletal   Abdominal   Peds  Hematology negative hematology ROS (+)   Anesthesia Other Findings   Reproductive/Obstetrics                           Anesthesia Physical Anesthesia Plan  ASA: III  Anesthesia Plan: General   Post-op Pain Management:    Induction: Intravenous  Airway Management Planned: Oral ETT  Additional Equipment:   Intra-op Plan:   Post-operative Plan: Extubation in OR  Informed Consent:   Plan Discussed with: CRNA, Anesthesiologist and Surgeon  Anesthesia Plan Comments:         Anesthesia Quick Evaluation

## 2012-03-08 NOTE — Progress Notes (Signed)
Patient ID: [name], female   DOB: 09-27-1925, 78 y.o.   MRN: [id number] The recent History & Physical has been reviewed. I have personally examined the patient today. There is no interval change to the documented History & Physical. The patient would like to proceed with the procedure.  Antwian Santaana W 03/08/2012,  2:16 PM

## 2012-03-08 NOTE — Op Note (Signed)
PATIENT ID:      [name]  MRN:     [id number] DOB/AGE:    January 04, 1926 / 78 y.o.       OPERATIVE REPORT    DATE OF PROCEDURE:  03/08/2012       PREOPERATIVE DIAGNOSIS:   infected right total knee with staph coagulase negative bacteria                                                       There is no height or weight on file to calculate BMI.     POSTOPERATIVE DIAGNOSIS:   infected right total knee with staph coagulase negative bacteria                                                                     There is no height or weight on file to calculate BMI.     PROCEDURE:  Procedure(s): IRRIGATION AND DEBRIDEMENT KNEE WITH POLY EXCHANGE right     SURGEON:  [name], MD    ASSISTANT:   Jacqualine Code, PA-C   (Present and scrubbed throughout the case, critical for assistance with exposure, retraction, instrumentation, and closure.)          ANESTHESIA: general     DRAINS: (right knee) Hemovact drain(s) in the open with  Suction Open :      TOURNIQUET TIME:  Total Tourniquet Time Documented: Thigh (Right) - 61 minutes    COMPLICATIONS:  None   CONDITION:  stable  PROCEDURE IN DETAIL: [id number]   [name] W 03/08/2012, 4:54 PM

## 2012-03-08 NOTE — Transfer of Care (Signed)
Immediate Anesthesia Transfer of Care Note  Patient: [name]  Procedure(s) Performed: Procedure(s) (LRB) with comments: IRRIGATION AND DEBRIDEMENT KNEE WITH POLY EXCHANGE (Right) - Right Total Knee Irrigation & Debridement, Synovectomy, Poly Exchange, Placement of Antibiotic Beads  Patient Location: PACU  Anesthesia Type:General  Level of Consciousness: awake and alert   Airway & Oxygen Therapy: Patient Spontanous Breathing and Patient connected to nasal cannula oxygen  Post-op Assessment: Report given to PACU RN, Post -op Vital signs reviewed and stable and Patient moving all extremities  Post vital signs: Reviewed and stable  Complications: No apparent anesthesia complications

## 2012-03-08 NOTE — Progress Notes (Signed)
Orthopedic Tech Progress Note Patient Details:  Princes Finger September 26, 1925 [id number] Start time 8:05 pm. CPM Right Knee CPM Right Knee: On Right Knee Flexion (Degrees): 60  Right Knee Extension (Degrees): 0  Additional Comments: applied overhead frame   [name] 03/08/2012, 8:53 PM

## 2012-03-09 ENCOUNTER — Encounter: Payer: Self-pay | Admitting: Neurosurgery

## 2012-03-09 ENCOUNTER — Encounter (HOSPITAL_COMMUNITY): Payer: Self-pay | Admitting: Orthopaedic Surgery

## 2012-03-09 DIAGNOSIS — T8450XA Infection and inflammatory reaction due to unspecified internal joint prosthesis, initial encounter: Secondary | ICD-10-CM

## 2012-03-09 DIAGNOSIS — Y831 Surgical operation with implant of artificial internal device as the cause of abnormal reaction of the patient, or of later complication, without mention of misadventure at the time of the procedure: Secondary | ICD-10-CM

## 2012-03-09 LAB — URINE CULTURE

## 2012-03-09 LAB — CBC
HCT: 36.3 % (ref 36.0–46.0)
MCHC: 33.9 g/dL (ref 30.0–36.0)
Platelets: 254 10*3/uL (ref 150–400)
RDW: 13 % (ref 11.5–15.5)
WBC: 6.8 10*3/uL (ref 4.0–10.5)

## 2012-03-09 LAB — BASIC METABOLIC PANEL
BUN: 14 mg/dL (ref 6–23)
Chloride: 102 mEq/L (ref 96–112)
GFR calc Af Amer: 54 mL/min — ABNORMAL LOW (ref >90)
GFR calc non Af Amer: 46 mL/min — ABNORMAL LOW (ref >90)
Potassium: 4 mEq/L (ref 3.5–5.1)
Sodium: 137 mEq/L (ref 135–145)

## 2012-03-09 MED ORDER — RIFAMPIN 300 MG PO CAPS
300.0000 mg | ORAL_CAPSULE | Freq: Two times a day (BID) | ORAL | Status: DC
  Administered 2012-03-10 – 2012-03-11 (×2): 300 mg via ORAL
  Filled 2012-03-09 (×7): qty 1

## 2012-03-09 MED ORDER — ENOXAPARIN SODIUM 30 MG/0.3ML ~~LOC~~ SOLN
30.0000 mg | Freq: Two times a day (BID) | SUBCUTANEOUS | Status: DC
  Administered 2012-03-09 – 2012-03-11 (×4): 30 mg via SUBCUTANEOUS
  Filled 2012-03-09 (×6): qty 0.3

## 2012-03-09 NOTE — Progress Notes (Signed)
Patient ID: [name], female   DOB: 01/31/1926, 78 y.o.   MRN: [id number] PATIENT ID: [name]        MRN:  [id number]          DOB/AGE: 1926-03-22 / 78 y.o.    Megan Campbell, MD   Jacqualine Code, PA-C [address] [place], [place]  [address]                             [phone]   PROGRESS NOTE  Subjective:  negative for Chest Pain  negative for Shortness of Breath  negative for Nausea/Vomiting   negative for Calf Pain    Tolerating Diet: yes         Patient reports pain as moderate.     Awake, alert, good night  Objective: Vital signs in last 24 hours:   Patient Vitals for the past 24 hrs:  BP Temp Temp src Pulse Resp SpO2 Height Weight  03/09/12 0549 145/62 mmHg 98.8 F (37.1 C) - 56  16  96 % - -  03/09/12 0400 - - - - 16  96 % - -  03/09/12 0200 134/53 mmHg 98.3 F (36.8 C) - 68  16  96 % - -  03/08/12 2322 - - - - 18  100 % - -  03/08/12 1950 151/56 mmHg 98.1 F (36.7 C) - 66  16  100 % 5\' 4"  (1.626 m) 78.019 kg (172 lb)  03/08/12 1930 158/62 mmHg - - 62  14  99 % - -  03/08/12 1915 176/66 mmHg - - 58  10  100 % - -  03/08/12 1900 179/64 mmHg 97.6 F (36.4 C) - 58  12  100 % - -  03/08/12 1845 173/61 mmHg - - 58  11  99 % - -  03/08/12 1830 185/66 mmHg - - 62  17  100 % - -  03/08/12 1815 176/65 mmHg - - 59  15  99 % - -  03/08/12 1800 183/72 mmHg - - 65  13  99 % - -  03/08/12 1745 184/68 mmHg - - 68  17  100 % - -  03/08/12 1730 169/73 mmHg - - 81  17  100 % - -  03/08/12 1723 169/73 mmHg 98.1 F (36.7 C) - 82  12  100 % - -  03/08/12 1108 134/68 mmHg 98.5 F (36.9 C) Oral 63  18  95 % - -      Intake/Output from previous day:   12/10 0701 - 12/11 0700 In: 3183.8 [P.O.:240; I.V.:2293.8] Out: 1360 [Urine:1050; Drains:260]   Intake/Output this shift:       Intake/Output      12/10 0701 - 12/11 0700 12/11 0701 - 12/12 0700   P.O. 240    I.V. (mL/kg) 2293.8 (29.4)    IV Piggyback 650    Total Intake(mL/kg) 3183.8 (40.8)    Urine  (mL/kg/hr) 1050 (0.6)    Drains 260    Blood 50    Total Output 1360    Net +1823.8            LABORATORY DATA:  Basename 03/09/12 0520 03/08/12 1102  WBC 6.8 8.8  HGB 12.3 14.5  HCT 36.3 41.2  PLT 254 280    Basename 03/09/12 0520 03/08/12 1102  NA 137 137  K 4.0 3.7  CL 102 97  CO2 27 29  BUN 14 17  CREATININE 1.07 1.00  GLUCOSE 80 98  CALCIUM 8.8 9.5   Lab Results  Component Value Date   INR 1.16 03/08/2012   INR 0.93 01/10/2010   INR 1.07 07/06/2009    Recent Radiographic Studies :   Chest 2 View  03/08/2012  *RADIOLOGY REPORT*  Clinical Data: Preop right total knee revision  CHEST - 2 VIEW  Comparison: 01/10/2010  Findings: Lungs are clear. No pleural effusion or pneumothorax.  Heart is top normal in size.  Degenerative changes of the visualized thoracolumbar spine.  Surgical clips in the left chest wall / axilla.  IMPRESSION: No evidence of acute cardiopulmonary disease.   Original Report Authenticated By: Charline Bills, M.D.      Examination:  General appearance: alert, cooperative and no distress  Wound Exam: clean, dry, intact   Drainage:  hemovac output 200cc in last shift  Motor Exam: EHL, FHL, Anterior Tibial and Posterior Tibial Intact  Sensory Exam: Superficial Peroneal, Deep Peroneal and Tibial normal  Vascular Exam: Normal  Assessment:    1 Day Post-Op  Procedure(s) (LRB): IRRIGATION AND DEBRIDEMENT KNEE WITH POLY EXCHANGE (Right)  ADDITIONAL DIAGNOSIS:  Active Problems:  * No active hospital problems. *   no new problems   Plan: Physical Therapy as ordered Weight Bearing as Tolerated (WBAT)  DVT Prophylaxis:  Xarelto  DISCHARGE PLAN: Skilled Nursing Facility/Rehab-family wishes [facility] or [facility]  DISCHARGE NEEDS: has equipment    oob WITH pt, id CONSULT     Valeria Batman 03/09/2012 7:38 AM

## 2012-03-09 NOTE — Progress Notes (Signed)
Spoke with PT.  Pt in great pain this morning after therapy with PT.  Will defer eval until tomorrow.  Pt lives alone and will d/c to SNF.  Will evaluate tomorrow if pt agreeable. Tory Emerald, [place] [phone]

## 2012-03-09 NOTE — Evaluation (Signed)
Physical Therapy Evaluation Patient Details Name: [name] MRN: [id number] DOB: 1926/03/17 Today's Date: 03/09/2012 Time: 1012-1050 PT Time Calculation (min): 38 min  PT Assessment / Plan / Recommendation Clinical Impression  Pt 78 yo female s/p R TKA revision presenting with significant pain greatly limiting ambulation and R Knee ROM. Patient plans on going to SNF post d/c due to patient living by herself. patient to benefit from skilled acute PT to address R LE strength, R knee ROM, and mobiltity impariments.    PT Assessment  Patient needs continued PT services    Follow Up Recommendations  SNF;Supervision/Assistance - 24 hour    Does the patient have the potential to tolerate intense rehabilitation      Barriers to Discharge None      Equipment Recommendations   (pt has dme)    Recommendations for Other Services     Frequency 7X/week    Precautions / Restrictions Precautions Precautions: Fall Restrictions Weight Bearing Restrictions: Yes RUE Weight Bearing: Weight bearing as tolerated   Pertinent Vitals/Pain 8/10 pain upon PT arrival, 10/10 pain post PT in R knee      Mobility  Bed Mobility Bed Mobility: Sit to Supine Sit to Supine: 3: Mod assist;HOB flat Details for Bed Mobility Assistance: assist for R LE management Transfers Transfers: Sit to Stand;Stand to Sit;Stand Pivot Transfers Sit to Stand: 4: Min guard;With upper extremity assist;With armrests;From chair/3-in-1 Stand to Sit: 3: Mod assist;With upper extremity assist;With armrests;To chair/3-in-1 (A for R LE mangement) Stand Pivot Transfers: 4: Min assist (with RW) Details for Transfer Assistance: increased time required. v/c's for sequencing and to increase R LE WBing, pt pivoted on L LE ball of foot Ambulation/Gait Ambulation/Gait Assistance: 4: Min assist Ambulation Distance (Feet): 5 Feet Assistive device: Rolling walker Ambulation/Gait Assistance Details: pt with limited R LE WBing  tolerance, appears <10%. increased time required Gait Pattern: Step-to pattern;Decreased stance time - right;Decreased hip/knee flexion - right;Decreased weight shift to right Gait velocity: slow Stairs: No    Shoulder Instructions     Exercises Total Joint Exercises Ankle Circles/Pumps: AROM;Both;10 reps;Seated Quad Sets: AROM;Right;10 reps;Seated (with LEs elevated) Knee Flexion: AAROM;Right;10 reps;Seated Goniometric ROM: pt with 15 deg of flexion, limited by pain, despite max encouragement to bend knee to assist with moving fluid/edema and decreasing stiffness   PT Diagnosis: Difficulty walking  PT Problem List: Decreased strength;Decreased range of motion;Decreased activity tolerance PT Treatment Interventions: DME instruction;Gait training;Stair training;Functional mobility training;Therapeutic activities;Therapeutic exercise   PT Goals Acute Rehab PT Goals PT Goal Formulation: With patient/family Time For Goal Achievement: 03/16/12 Potential to Achieve Goals: Good Pt will go Supine/Side to Sit: with supervision;with HOB 0 degrees PT Goal: Supine/Side to Sit - Progress: Goal set today Pt will go Sit to Stand: with supervision PT Goal: Sit to Stand - Progress: Goal set today Pt will Transfer Bed to Chair/Chair to Bed: with supervision (with RW) PT Transfer Goal: Bed to Chair/Chair to Bed - Progress: Goal set today Pt will Ambulate: with supervision PT Goal: Ambulate - Progress: Goal set today Pt will Perform Home Exercise Program: Independently PT Goal: Perform Home Exercise Program - Progress: Goal set today  Visit Information  Last PT Received On: 03/09/12 Assistance Needed: +1    Subjective Data  Subjective: Pt received sitting up in chair with c/o 8/10 R knee pain   Prior Functioning  Home Living Lives With: Alone Available Help at Discharge: Skilled Nursing Facility (plans on going to rehab facility) Type of Home: House Home Access:  Stairs to enter [employer] of Steps: 5 Entrance Stairs-Rails: Right Home Layout: One level Bathroom Shower/Tub: Engineer, manufacturing systems: Standard Bathroom Accessibility: Yes How Accessible: Accessible via walker Home Adaptive Equipment: Walker - rolling Prior Function Level of Independence: Independent Able to Take Stairs?: Yes Driving: Yes Vocation: Retired Musician: No difficulties Dominant Hand: Right    Cognition  Overall Cognitive Status: Appears within functional limits for tasks assessed/performed Arousal/Alertness: Awake/alert Orientation Level: Oriented X4 / Intact Behavior During Session: WFL for tasks performed    Extremity/Trunk Assessment Right Upper Extremity Assessment RUE ROM/Strength/Tone: Within functional levels Left Upper Extremity Assessment LUE ROM/Strength/Tone: Within functional levels Right Lower Extremity Assessment RLE ROM/Strength/Tone: Deficits;Due to pain RLE ROM/Strength/Tone Deficits: pt able to initiate quad set. pt with minimal R knee flexion tolerance due to pain Left Lower Extremity Assessment LLE ROM/Strength/Tone: Within functional levels Trunk Assessment Trunk Assessment: Normal   Balance    End of Session PT - End of Session Equipment Utilized During Treatment: Gait belt Activity Tolerance: Patient limited by pain Patient left: in bed;in CPM;with call bell/phone within reach;with family/visitor present Nurse Communication: Mobility status CPM Right Knee CPM Right Knee: On Right Knee Flexion (Degrees): 60  Right Knee Extension (Degrees): 0   GP     [name] 03/09/2012, 11:09 AM  Lewis Shock, PT, DPT Pager #: [phone] Office #: [phone]

## 2012-03-09 NOTE — Consult Note (Addendum)
[facility]    Date of Admission:  03/08/2012  Date of Consult:  03/09/2012  Reason for Consult: Dr.   Referring Physician:  Dr. Cleophas Dunker    HPI: [name] is an 78 y.o. female who is sp  total knee replacement placed in October 2009 by Dr. Erasmo Leventhal. She has had problems with motion with this at times. She had last seen him in April 2012. At that time she had been using tramadol intermittently for pain at least 2-3 times per day. She, unfortunately at the time of her surgery, had an injury to the popliteal artery and it was evaluated by Dr. Imogene Burn from vascular surgery. Apparently there was a hole in the lateral wall of the popliteal artery. Dr. Imogene Burn and Dr. Myra Gianotti did a bovine patch graft. She ended up, unfortunately, having arthrofibrosis postop and did have to have manipulation. S  on Sunday 02/28/2012 she was visiting a friend in the hospital and stood up and had pain in the posterior and lateral aspect of the knee. There is no known injury other than getting up from a chair. She notes some swelling in the area. It also feels warm. She has had difficulty weightbearing and has actually increased her tramadol. She has pain even at nighttime when trying to turn over. She denies any neurovascular compromise. Evaluated on 12//06/2011 and an aspiration of the joint revealed very cloudy yellow fluid which was sent for studies as well as blood analysis.   They were abundant WBC in the gram stain, but no organisms were seen on day   The fluid color was yellow, it was turbid. There was 74,373 white cells, 86% were neutrophils. The glucose was <20 . There were no crystals.   Cultures grew staph coag negative staph R to PCN but otherwise susceptible OXacilin, CEF, azithro, cipro, levo, vanco, clinda, rifampin,tetracycline   The C-reactive protein was significantly elevated at 14. The sed rate was 58.  Patient was brought to the OR and underwent irrigation,    debridement, and synovectomy, and polyethylene exchange on 03/09/12. We were consulted to assist in management and workup of this pt with prosthetic knee infection. I spent greater than 60 minutes with the patient including greater than 50% of time in face to face counsel of the patient and in coordination of their care.     Past Medical History  Diagnosis Date  . Hypertension   . Thyroid disease   . Hiatal hernia   . Arthritis   . Diverticulosis   . Cancer     breast  . Cancer     Uterine  . Complication of anesthesia   . PONV (postoperative nausea and vomiting)   . Hypothyroidism     Past Surgical History  Procedure Date  . Total knee arthroplasty     left knee  . Popliteal artery repair 01/16/10    right with patch angioplasty  . Breast surgery   . I&d knee with poly exchange 03/08/2012    Procedure: IRRIGATION AND DEBRIDEMENT KNEE WITH POLY EXCHANGE;  Surgeon: Valeria Batman, MD;  Location: [facility] OR;  Service: Orthopedics;  Laterality: Right;  Right Total Knee Irrigation & Debridement, Synovectomy, Poly Exchange, Placement of Antibiotic Beads  ergies:   No Known Allergies   Medications: I have reviewed patients current medications as documented in Epic Anti-infectives     Start     Dose/Rate Route Frequency Ordered Stop   03/09/12 1800   rifampin (RIFADIN)  capsule 300 mg        300 mg Oral Every 12 hours 03/09/12 1449     03/08/12 2359   ceFAZolin (ANCEF) IVPB 2 g/50 mL premix        2 g 100 mL/hr over 30 Minutes Intravenous 3 times per day 03/08/12 2028 03/10/12 2159   03/08/12 1702   gentamicin (GARAMYCIN) injection  Status:  Discontinued          As needed 03/08/12 1703 03/08/12 1732   03/08/12 1700   vancomycin (VANCOCIN) powder  Status:  Discontinued          As needed 03/08/12 1701 03/08/12 1732          Social History:  reports that she has never smoked. She does not have any smokeless tobacco history on file. She reports that she does not use  illicit drugs. Her alcohol history not on file.  History reviewed. No pertinent family history.  As in HPI and primary teams notes otherwise 12 point review of systems is negative  Blood pressure 162/55, pulse 62, temperature 98.6 F (37 C), temperature source Oral, resp. rate 18, height 5\' 4"  (1.626 m), weight 172 lb (78.019 kg), SpO2 97.00%.  General: Alert and awake, oriented x3, not in any acute distress. HEENT: anicteric sclera, pupils reactive to light and accommodation, EOMI, oropharynx clear and without exudate CVS regular rate, normal r,  no murmur rubs or gallops Chest: clear to auscultation bilaterally, no wheezing, rales or rhonchi Abdomen: soft nontender, nondistended, normal bowel sounds, Extremities:knee with dressing and drain with bloody material Skin: no rashes Neuro: nonfocal, strength and sensation intact   Results for orders placed during the hospital encounter of 03/08/12 (from the past 48 hour(s))  SURGICAL PCR SCREEN     Status: Normal   Collection Time   03/08/12 11:00 AM      Component Value Range Comment   MRSA, PCR NEGATIVE  NEGATIVE    Staphylococcus aureus NEGATIVE  NEGATIVE   CBC     Status: Normal   Collection Time   03/08/12 11:02 AM      Component Value Range Comment   WBC 8.8  4.0 - 10.5 K/uL    RBC 4.63  3.87 - 5.11 MIL/uL    Hemoglobin 14.5  12.0 - 15.0 g/dL    HCT 16.1  09.6 - 04.5 %    MCV 89.0  78.0 - 100.0 fL    MCH 31.3  26.0 - 34.0 pg    MCHC 35.2  30.0 - 36.0 g/dL    RDW 40.9  81.1 - 91.4 %    Platelets 280  150 - 400 K/uL   COMPREHENSIVE METABOLIC PANEL     Status: Abnormal   Collection Time   03/08/12 11:02 AM      Component Value Range Comment   Sodium 137  135 - 145 mEq/L    Potassium 3.7  3.5 - 5.1 mEq/L    Chloride 97  96 - 112 mEq/L    CO2 29  19 - 32 mEq/L    Glucose, Bld 98  70 - 99 mg/dL    BUN 17  6 - 23 mg/dL    Creatinine, Ser 7.82  0.50 - 1.10 mg/dL    Calcium 9.5  8.4 - 95.6 mg/dL    Total Protein 7.7  6.0 -  8.3 g/dL    Albumin 3.3 (*) 3.5 - 5.2 g/dL    AST 18  0 - 37 U/L  ALT 21  0 - 35 U/L    Alkaline Phosphatase 79  39 - 117 U/L    Total Bilirubin 0.3  0.3 - 1.2 mg/dL    GFR calc non Af Amer 50 (*) >90 mL/min    GFR calc Af Amer 58 (*) >90 mL/min   PROTIME-INR     Status: Normal   Collection Time   03/08/12 11:02 AM      Component Value Range Comment   Prothrombin Time 14.6  11.6 - 15.2 seconds    INR 1.16  0.00 - 1.49   APTT     Status: Normal   Collection Time   03/08/12 11:02 AM      Component Value Range Comment   aPTT 37  24 - 37 seconds   TYPE AND SCREEN     Status: Normal   Collection Time   03/08/12 11:02 AM      Component Value Range Comment   ABO/RH(D) A POS      Antibody Screen NEG      Sample Expiration 03/11/2012     ABO/RH     Status: Normal   Collection Time   03/08/12 11:10 AM      Component Value Range Comment   ABO/RH(D) A POS     URINE CULTURE     Status: Normal   Collection Time   03/08/12  3:34 PM      Component Value Range Comment   Specimen Description URINE, CATHETERIZED      Special Requests FROM [facility] IN OR      Culture  Setup Time 03/08/2012 17:10      Colony Count NO GROWTH      Culture NO GROWTH      Report Status 03/09/2012 FINAL     CULTURE, ROUTINE-ABSCESS     Status: Normal (Preliminary result)   Collection Time   03/08/12  3:37 PM      Component Value Range Comment   Specimen Description ABSCESS LEG RIGHT      Special Requests NONE      Gram Stain        Value: MODERATE WBC PRESENT,BOTH PMN AND MONONUCLEAR     RARE SQUAMOUS EPITHELIAL CELLS PRESENT     NO ORGANISMS SEEN   Culture NO GROWTH 1 DAY      Report Status PENDING     ANAEROBIC CULTURE     Status: Normal (Preliminary result)   Collection Time   03/08/12  3:37 PM      Component Value Range Comment   Specimen Description ABSCESS LEG RIGHT      Special Requests NONE      Gram Stain PENDING      Culture        Value: NO ANAEROBES ISOLATED; CULTURE IN  PROGRESS FOR 5 DAYS   Report Status PENDING     URINALYSIS, ROUTINE W REFLEX MICROSCOPIC     Status: Normal   Collection Time   03/08/12  3:44 PM      Component Value Range Comment   Color, Urine YELLOW  YELLOW    APPearance CLEAR  CLEAR    Specific Gravity, Urine 1.011  1.005 - 1.030    pH 7.0  5.0 - 8.0    Glucose, UA NEGATIVE  NEGATIVE mg/dL    Hgb urine dipstick NEGATIVE  NEGATIVE    Bilirubin Urine NEGATIVE  NEGATIVE    Ketones, ur NEGATIVE  NEGATIVE mg/dL    Protein, ur NEGATIVE  NEGATIVE  mg/dL    Urobilinogen, UA 1.0  0.0 - 1.0 mg/dL    Nitrite NEGATIVE  NEGATIVE    Leukocytes, UA NEGATIVE  NEGATIVE MICROSCOPIC NOT DONE ON URINES WITH NEGATIVE PROTEIN, BLOOD, LEUKOCYTES, NITRITE, OR GLUCOSE <1000 mg/dL.  TISSUE CULTURE     Status: Normal (Preliminary result)   Collection Time   03/08/12  3:50 PM      Component Value Range Comment   Specimen Description TISSUE LEG RIGHT      Special Requests DEBRIDEMENT TISSUE CULTURE      Gram Stain        Value: RARE WBC PRESENT, PREDOMINANTLY MONONUCLEAR     NO ORGANISMS SEEN   Culture NO GROWTH 1 DAY      Report Status PENDING     ANAEROBIC CULTURE     Status: Normal (Preliminary result)   Collection Time   03/08/12  3:50 PM      Component Value Range Comment   Specimen Description TISSUE LEG RIGHT      Special Requests DEBRIDEMENT TISSUE CULTURE      Gram Stain PENDING      Culture        Value: NO ANAEROBES ISOLATED; CULTURE IN PROGRESS FOR 5 DAYS   Report Status PENDING     CBC     Status: Normal   Collection Time   03/09/12  5:20 AM      Component Value Range Comment   WBC 6.8  4.0 - 10.5 K/uL    RBC 4.06  3.87 - 5.11 MIL/uL    Hemoglobin 12.3  12.0 - 15.0 g/dL    HCT 16.1  09.6 - 04.5 %    MCV 89.4  78.0 - 100.0 fL    MCH 30.3  26.0 - 34.0 pg    MCHC 33.9  30.0 - 36.0 g/dL    RDW 40.9  81.1 - 91.4 %    Platelets 254  150 - 400 K/uL   BASIC METABOLIC PANEL     Status: Abnormal   Collection Time   03/09/12  5:20 AM        Component Value Range Comment   Sodium 137  135 - 145 mEq/L    Potassium 4.0  3.5 - 5.1 mEq/L    Chloride 102  96 - 112 mEq/L    CO2 27  19 - 32 mEq/L    Glucose, Bld 80  70 - 99 mg/dL    BUN 14  6 - 23 mg/dL    Creatinine, Ser 7.82  0.50 - 1.10 mg/dL    Calcium 8.8  8.4 - 95.6 mg/dL    GFR calc non Af Amer 46 (*) >90 mL/min    GFR calc Af Amer 54 (*) >90 mL/min       Component Value Date/Time   SDES TISSUE LEG RIGHT 03/08/2012 1550   SDES TISSUE LEG RIGHT 03/08/2012 1550   SPECREQUEST DEBRIDEMENT TISSUE CULTURE 03/08/2012 1550   SPECREQUEST DEBRIDEMENT TISSUE CULTURE 03/08/2012 1550   CULT NO GROWTH 1 DAY 03/08/2012 1550   CULT NO ANAEROBES ISOLATED; CULTURE IN PROGRESS FOR 5 DAYS 03/08/2012 1550   REPTSTATUS PENDING 03/08/2012 1550   REPTSTATUS PENDING 03/08/2012 1550    Chest 2 View  03/08/2012  *RADIOLOGY REPORT*  Clinical Data: Preop right total knee revision  CHEST - 2 VIEW  Comparison: 01/10/2010  Findings: Lungs are clear. No pleural effusion or pneumothorax.  Heart is top normal in size.  Degenerative changes of the visualized thoracolumbar spine.  Surgical clips in the left chest wall / axilla.  IMPRESSION: No evidence of acute cardiopulmonary disease.   Original Report Authenticated By: Charline Bills, M.D.      Recent Results (from the past 720 hour(s))  SURGICAL PCR SCREEN     Status: Normal   Collection Time   03/08/12 11:00 AM      Component Value Range Status Comment   MRSA, PCR NEGATIVE  NEGATIVE Final    Staphylococcus aureus NEGATIVE  NEGATIVE Final   URINE CULTURE     Status: Normal   Collection Time   03/08/12  3:34 PM      Component Value Range Status Comment   Specimen Description URINE, CATHETERIZED   Final    Special Requests FROM [facility] IN OR   Final    Culture  Setup Time 03/08/2012 17:10   Final    Colony Count NO GROWTH   Final    Culture NO GROWTH   Final    Report Status 03/09/2012 FINAL   Final   CULTURE, ROUTINE-ABSCESS      Status: Normal (Preliminary result)   Collection Time   03/08/12  3:37 PM      Component Value Range Status Comment   Specimen Description ABSCESS LEG RIGHT   Final    Special Requests NONE   Final    Gram Stain     Final    Value: MODERATE WBC PRESENT,BOTH PMN AND MONONUCLEAR     RARE SQUAMOUS EPITHELIAL CELLS PRESENT     NO ORGANISMS SEEN   Culture NO GROWTH 1 DAY   Final    Report Status PENDING   Incomplete   ANAEROBIC CULTURE     Status: Normal (Preliminary result)   Collection Time   03/08/12  3:37 PM      Component Value Range Status Comment   Specimen Description ABSCESS LEG RIGHT   Final    Special Requests NONE   Final    Gram Stain PENDING   Incomplete    Culture     Final    Value: NO ANAEROBES ISOLATED; CULTURE IN PROGRESS FOR 5 DAYS   Report Status PENDING   Incomplete   TISSUE CULTURE     Status: Normal (Preliminary result)   Collection Time   03/08/12  3:50 PM      Component Value Range Status Comment   Specimen Description TISSUE LEG RIGHT   Final    Special Requests DEBRIDEMENT TISSUE CULTURE   Final    Gram Stain     Final    Value: RARE WBC PRESENT, PREDOMINANTLY MONONUCLEAR     NO ORGANISMS SEEN   Culture NO GROWTH 1 DAY   Final    Report Status PENDING   Incomplete   ANAEROBIC CULTURE     Status: Normal (Preliminary result)   Collection Time   03/08/12  3:50 PM      Component Value Range Status Comment   Specimen Description TISSUE LEG RIGHT   Final    Special Requests DEBRIDEMENT TISSUE CULTURE   Final    Gram Stain PENDING   Incomplete    Culture     Final    Value: NO ANAEROBES ISOLATED; CULTURE IN PROGRESS FOR 5 DAYS   Report Status PENDING   Incomplete      Impression/Recommendation  78 year old with prosthetic knee infection sp irrigation, debridement, and synovectomy, and polyethylene exchange "single staged" procedure with CNS as culprit  #1 Prosthetic knee infection with CNS  S to Cefazolin --continue cefazolin 2g iv q 8 hours and  finish 42 day postoperative course with TENTATIVE STOP DATE BEING January 22ND, 2014  --add in rifampin 300mg  bid during this course of therapy --I would like weekly cbc, cmp faxed to me at [phone]  I would like her to be seen by [place] in [facility] clinic PRIOR to her stopping IV antibiotics so that we can transition her to oral abx likely keflex +/- rifampin for several months afterwards  #2 Screening: check HIV though not high risk  I will arrange HSFU with me in my clinic prior to stopping iV abx. Please call with further questions   Thank you so much for this interesting consult  [facility] [phone] (pager) [phone] (office) 03/09/2012, 4:48 PM  Paulette Blanch Dam 03/09/2012, 4:48 PM

## 2012-03-09 NOTE — Progress Notes (Deleted)
Clinical Social Work Department  CLINICAL SOCIAL WORK PLACEMENT NOTE  03/06/2012  Patient: [name]  Account Number:  [id number] Admit date: 03/07/12  Clinical Social Worker: Sabino Niemann MSW Date/time: 03/09/2012 1:30 AM  Clinical Social Work is seeking post-discharge placement for this patient at the following level of care: SKILLED NURSING (*CSW will update this form in Epic as items are completed)  03/09/2012 Patient/family provided with [facility] Department of Clinical Social Work's list of facilities offering this level of care within the geographic area requested by the patient (or if unable, by the patient's family).  03/09/2012 Patient/family informed of their freedom to choose among providers that offer the needed level of care, that participate in Medicare, Medicaid or managed care program needed by the patient, have an available bed and are willing to accept the patient.  03/09/2012 Patient/family informed of [facility]' ownership interest in [facility], as well as of the fact that they are under no obligation to receive care at this facility.  PASARR submitted to EDS on  PASARR number received from EDS on  FL2 transmitted to all facilities in geographic area requested by pt/family on 03/09/2012  FL2 transmitted to all facilities within larger geographic area on  Patient informed that his/her managed care company has contracts with or will negotiate with certain facilities, including the following:  Patient/family informed of bed offers received:  Patient chooses bed at  Physician recommends and patient chooses bed at  Patient to be transferred to on  Patient to be transferred to facility by  The following physician request were entered in Epic:  Additional Comments:  Sabino Niemann, MSW  [phone]

## 2012-03-09 NOTE — Progress Notes (Signed)
CARE MANAGEMENT NOTE 03/09/2012  Patient:  [name], [name]   Account Number:  [id number]  Date Initiated:  03/09/2012  Documentation initiated by:  Vance Peper  Subjective/Objective Assessment:   78 yr old female s/p right total knee replacement I&D,synovectomy and polyethylene exchange.     Action/Plan:   Patient is for shortterm rehab at [facility]. patient wants [place]. Social Worker is aware.  Preoperatively setup with [facility].   Anticipated DC Date:  03/11/2012   Anticipated DC Plan:  SKILLED NURSING FACILITY  In-house referral  Clinical Social Worker      DC Planning Services  CM consult      Choice offered to / List presented to:             Status of service:  Completed, signed off Medicare Important Message given?   (If response is "NO", the following Medicare IM given date fields will be blank) Date Medicare IM given:   Date Additional Medicare IM given:    Discharge Disposition:  SKILLED NURSING FACILITY  Per UR Regulation:    If discussed at Long Length of Stay Meetings, dates discussed:    Comments:

## 2012-03-09 NOTE — Progress Notes (Signed)
Patient ID: [name], female   DOB: 1925-07-16, 78 y.o.   MRN: [id number] PATIENT ID: [name]        MRN:  [id number]          DOB/AGE: 1925/07/05 / 78 y.o.    [name], MD   Jacqualine Code, PA-C [address] [place], [place]  [address]                             [phone]   PROGRESS NOTE  Subjective:  negative for Chest Pain  negative for Shortness of Breath  negative for Nausea/Vomiting   negative for Calf Pain    Tolerating Diet: yes         Patient reports pain as moderate.     Resting comfortably waiting for pain meds  Objective: Vital signs in last 24 hours:   Patient Vitals for the past 24 hrs:  BP Temp Temp src Pulse Resp SpO2 Height Weight  03/09/12 0549 145/62 mmHg 98.8 F (37.1 C) - 56  16  96 % - -  03/09/12 0400 - - - - 16  96 % - -  03/09/12 0200 134/53 mmHg 98.3 F (36.8 C) - 68  16  96 % - -  03/08/12 2322 - - - - 18  100 % - -  03/08/12 1950 151/56 mmHg 98.1 F (36.7 C) - 66  16  100 % 5\' 4"  (1.626 m) 78.019 kg (172 lb)  03/08/12 1930 158/62 mmHg - - 62  14  99 % - -  03/08/12 1915 176/66 mmHg - - 58  10  100 % - -  03/08/12 1900 179/64 mmHg 97.6 F (36.4 C) - 58  12  100 % - -  03/08/12 1845 173/61 mmHg - - 58  11  99 % - -  03/08/12 1830 185/66 mmHg - - 62  17  100 % - -  03/08/12 1815 176/65 mmHg - - 59  15  99 % - -  03/08/12 1800 183/72 mmHg - - 65  13  99 % - -  03/08/12 1745 184/68 mmHg - - 68  17  100 % - -  03/08/12 1730 169/73 mmHg - - 81  17  100 % - -  03/08/12 1723 169/73 mmHg 98.1 F (36.7 C) - 82  12  100 % - -  03/08/12 1108 134/68 mmHg 98.5 F (36.9 C) Oral 63  18  95 % - -      Intake/Output from previous day:   12/10 0701 - 12/11 0700 In: 3183.8 [P.O.:240; I.V.:2293.8] Out: 1360 [Urine:1050; Drains:260]   Intake/Output this shift:       Intake/Output      12/10 0701 - 12/11 0700 12/11 0701 - 12/12 0700   P.O. 240    I.V. (mL/kg) 2293.8 (29.4)    IV Piggyback 650    Total Intake(mL/kg) 3183.8  (40.8)    Urine (mL/kg/hr) 1050 (0.6)    Drains 260    Blood 50    Total Output 1360    Net +1823.8            LABORATORY DATA:  Basename 03/09/12 0520 03/08/12 1102  WBC 6.8 8.8  HGB 12.3 14.5  HCT 36.3 41.2  PLT 254 280    Basename 03/09/12 0520 03/08/12 1102  NA 137 137  K 4.0 3.7  CL 102 97  CO2 27 29  BUN 14  17  CREATININE 1.07 1.00  GLUCOSE 80 98  CALCIUM 8.8 9.5   Lab Results  Component Value Date   INR 1.16 03/08/2012   INR 0.93 01/10/2010   INR 1.07 07/06/2009    Recent Radiographic Studies :   Chest 2 View  03/08/2012  *RADIOLOGY REPORT*  Clinical Data: Preop right total knee revision  CHEST - 2 VIEW  Comparison: 01/10/2010  Findings: Lungs are clear. No pleural effusion or pneumothorax.  Heart is top normal in size.  Degenerative changes of the visualized thoracolumbar spine.  Surgical clips in the left chest wall / axilla.  IMPRESSION: No evidence of acute cardiopulmonary disease.   Original Report Authenticated By: Charline Bills, M.D.      Examination:  General appearance: alert, cooperative, mild distress and moderate distress Resp: clear to auscultation bilaterally Cardio: regular rate and rhythm and systolic murmur: systolic ejection 1/6, low pitch at 2nd left intercostal space GI: normal findings: bowel sounds normal  Wound Exam: clean, dry, intact   Drainage:  None: wound tissue dry  Motor Exam: EHL, FHL, Anterior Tibial and Posterior Tibial Intact  Sensory Exam: Superficial Peroneal, Deep Peroneal and Tibial normal  Vascular Exam: Left posterior tibial artery has 2+ (normal) pulse  Assessment:    1 Day Post-Op  Procedure(s) (LRB): IRRIGATION AND DEBRIDEMENT KNEE WITH POLY EXCHANGE (Right)  ADDITIONAL DIAGNOSIS:  Active Problems:  * No active hospital problems. *      Plan: Physical Therapy as ordered Partial Weight Bearing @ 50% (PWB)  DVT Prophylaxis:  Xarelto, Foot Pumps and TED hose  DISCHARGE PLAN: Skilled Nursing  Facility/Rehab  DISCHARGE NEEDS: HHPT, HHRN, CPM, Walker, 3-in-1 comode seat and IV Antibiotics  ID to see her today         [facility] 03/09/2012 8:03 AM

## 2012-03-09 NOTE — Clinical Social Work Psychosocial (Addendum)
Clinical Social Work Department  BRIEF PSYCHOSOCIAL ASSESSMENT  Patient: [name]  Account Number: [id number]  Admit date: 03/07/12 Clinical Social Worker Date/Time: 03/09/12  Referred by: Physician Date Referred: 03/09/12  Referred for   SNF Placement   Other Referral:  Interview type: Patient  Other interview type: Patient's family present PSYCHOSOCIAL DATA  Living Status: Alone  Admitted from facility:  Level of care:  Primary support name: Foster,Carl Primary support relationship to patient: other  Degree of support available:  Strong and vested  CURRENT CONCERNS  Current Concerns   Post-Acute Placement   Other Concerns:  SOCIAL WORK ASSESSMENT / PLAN  CSW met with pt re: PT recommendation for SNF.   Pt lives alone   CSW explained placement process and answered questions.   Pt reports that she would like a bed at [facility] or [facility]    CSW completed FL2 and initiated [facility] search.   CSW to f/u with offers.   Assessment/plan status: Information/Referral to [facility]  Other assessment/ plan:  Information/referral to community resources:  SNF Choice list   PTAR   PATIENT'S/FAMILY'S RESPONSE TO PLAN OF CARE:  Pt reports agreeable to ST SNF in order to increase strength and independence with mobility prior to return home with her grandson. Pt verbalized understanding of placement process and appreciation for CSW assist.   Sabino Niemann MSW  [phone]

## 2012-03-09 NOTE — Op Note (Signed)
[name], [name] NO.:  [id number]  MEDICAL RECORD NO.:  [id number]  LOCATION:  5N22C                        FACILITY:  [facility]  PHYSICIAN:  Claude Manges. 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  PROCEDURE:  Exploration, right total knee replacement, irrigation, debridement, and synovectomy, and polyethylene exchange.  SURGEON:  Claude Manges. Cleophas Dunker, M.D.  ASSISTANT:  Jacqualine Code PA-C who was present throughout the operative procedure to ensure its timely completion.  ANESTHESIA:  General orotracheal.  COMPLICATIONS:  None.  PROCEDURE:  [name] was met in the holding area and identified the right knee as the appropriate operative site.  She did not receive a preoperative femoral nerve block.  Patient was then transported to room #7 and placed under general anesthesia without difficulty.  Nursing staff told to insert a Foley catheter.  Urine was clear.  The right lower extremity was placed in a thigh tourniquet.  The leg was then prepped with Betadine scrub and DuraPrep in the tourniquet to the tips of the toes.  Sterile draping was performed.  With the extremity still elevated, it was Esmarch exsanguinated with the proximal tourniquet at 350 mmHg.  The prior longitudinal incisions centered about the patella was elliptically excised and extended from the superior pouch to tibial tubercle via sharp dissection.  Incision was carried down in the subcutaneous tissue.  There was a abundant scar tissue.  First layer of capsule was incised in the midline.  A medial parapatellar incision was made with the Bovie.  The joint was entered.  There was a cloudy yellow joint fluid, this was sent  for culture and sensitivity.  Preoperatively, cultures had grown minimal Staph coagulase-negative.  She had an elevated sed rate, elevated C-reactive protein, approximately 75,000 white cells in the preoperative knee aspirate.  With some difficulty, I was able to evert the patella 180 degrees and the patient had an arthrofibrosis preoperatively and only had about 80- 85 degrees of flexion.  I released the cicatrix in the medial and lateral gutters.  At that point, I was able to flex the knee at least to 90 degrees.  I performed a very careful fastidious synovectomy beginning in the superior pouch and then extending distally, medially, and laterally. There was not a abundant amount of synovitis.  The patient has only been symptomatic approximately 9 days.  I carefully removed any synovitis from around the prosthesis.  I did not see any evidence of an osteomyelitis or I did not see any evidence of loosening of the components, did not appear to be any undermining of the bone or the methacrylate bone interface at any of the components.  I was easily able to remove the polyethylene component from the patella and removed any soft tissue from around the metallic component.  We carefully amputated the stem of the polyethylene component with an osteotome.  Patient had a prior vascular repair after the index knee replacement.  We were very careful about protecting  the posterior aspect of the knee.  With the polyethylene component removed, we were able to explore in a limited fashion.  The posterior aspect of the knee and further synovitis was excised.  Again, we carefully checked around the periphery of the femur and the tibia without evidence of undermining of the bone or evidence of osteomyelitis.  Both LCL and MCL were carefully protected.  There was some extraneous bone and methacrylate along the medial and lateral tibial plateau and these were removed.  Then, I further released deep  dense scar along the medial and lateral femoral condyles, which thus allowed increased flexion.  I then scrubbed the components with a scrub brush using Betadine to remove any glycocalyx and then copiously irrigated the joint with sterile saline.  I then changed gloves, and re-draped.  I repeated the saline irrigation.  I had removed the 12.5-mm bridging bearing and then because the patient had a arthrofibrosis elected to use a 10 mm bridging bearing as a trial and I thought it was too unstable, so then I reinserted the polyethylene component in the patella and a 12.5 mm bridging bearing on the tibia remained perfectly stable.  Again, I irrigated the joint with saline solution.  I inserted the beads of stimulant with the gentamicin and vancomycin and placed them about the joint.  I injected the deep capsule with 0.25% Marcaine with epinephrine.  I released the tourniquet at approximately 1 hour.  Applied thrombin spray to any bleeding joint surface.  We had immediate capillary refill.  A Hemovac was inserted through the lateral capsule and then I closed the wound with #1 PDS, then Vicryl 3-0 Monocryl and then skin clips.  Sterile bulky dressing was applied.  The patient tolerated the procedure well without complications.  We had that cultures for anaerobic and anaerobic bacteria as well as samples of synovium for the same.     Claude Manges. Cleophas Dunker, M.D.     PWW/MEDQ  D:  03/08/2012  T:  03/09/2012  Job:  [id number]

## 2012-03-09 NOTE — Progress Notes (Signed)
UR COMPLETED  

## 2012-03-09 NOTE — Clinical Social Work Placement (Addendum)
Clinical Social Work Department  CLINICAL SOCIAL WORK PLACEMENT NOTE  03/06/2012  Patient: [name]  Account Number:  [id number] Admit date: 03/07/12  Clinical Social Worker: Sabino Niemann MSW Date/time: 03/09/2012 1:30 AM  Clinical Social Work is seeking post-discharge placement for this patient at the following level of care: SKILLED NURSING (*CSW will update this form in Epic as items are completed)  03/09/2012 Patient/family provided with [facility] Department of Clinical Social Work's list of facilities offering this level of care within the geographic area requested by the patient (or if unable, by the patient's family).  03/09/2012 Patient/family informed of their freedom to choose among providers that offer the needed level of care, that participate in Medicare, Medicaid or managed care program needed by the patient, have an available bed and are willing to accept the patient.  03/09/2012 Patient/family informed of [facility]' ownership interest in [facility], as well as of the fact that they are under no obligation to receive care at this facility.  PASARR submitted to EDS on  PASARR number received from EDS on  FL2 transmitted to all facilities in geographic area requested by pt/family on 03/09/2012  FL2 transmitted to all facilities within larger geographic area on  Patient informed that his/her managed care company has contracts with or will negotiate with certain facilities, including the following:  Patient/family informed of bed offers received: 03/10/12 Patient chooses bed at [facility] Physician recommends and patient chooses bed at  [facility] Patient to be transferred to on 03/11/12 Patient to be transferred to facility by [facility] The following physician request were entered in Epic:  Additional Comments:  Sabino Niemann, MSW  [phone]

## 2012-03-10 ENCOUNTER — Ambulatory Visit: Payer: Medicare Other | Admitting: Neurosurgery

## 2012-03-10 ENCOUNTER — Encounter (HOSPITAL_COMMUNITY): Admission: RE | Admit: 2012-03-10 | Payer: Medicare Other | Source: Ambulatory Visit

## 2012-03-10 LAB — BASIC METABOLIC PANEL
BUN: 12 mg/dL (ref 6–23)
CO2: 30 mEq/L (ref 19–32)
Chloride: 99 mEq/L (ref 96–112)
Creatinine, Ser: 1.07 mg/dL (ref 0.50–1.10)

## 2012-03-10 LAB — CBC
HCT: 37.4 % (ref 36.0–46.0)
MCV: 89.7 fL (ref 78.0–100.0)
RBC: 4.17 MIL/uL (ref 3.87–5.11)
WBC: 8.3 10*3/uL (ref 4.0–10.5)

## 2012-03-10 MED ORDER — SODIUM CHLORIDE 0.9 % IJ SOLN
10.0000 mL | INTRAMUSCULAR | Status: DC | PRN
  Administered 2012-03-11: 10 mL

## 2012-03-10 MED ORDER — DSS 100 MG PO CAPS: 100.0000 mg | ORAL_CAPSULE | Freq: Two times a day (BID) | ORAL | Status: DC

## 2012-03-10 MED ORDER — ENOXAPARIN SODIUM 30 MG/0.3ML ~~LOC~~ SOLN: 30.0000 mg | Freq: Two times a day (BID) | SUBCUTANEOUS | Status: DC

## 2012-03-10 MED ORDER — OXYCODONE HCL 5 MG PO TABS: 5.0000 mg | ORAL_TABLET | ORAL | Status: DC | PRN

## 2012-03-10 MED ORDER — BISACODYL 10 MG RE SUPP: 10.0000 mg | Freq: Every day | RECTAL | Status: DC | PRN

## 2012-03-10 MED ORDER — CEFAZOLIN SODIUM-DEXTROSE 2-3 GM-% IV SOLR: 2.0000 g | Freq: Three times a day (TID) | INTRAVENOUS | Status: DC

## 2012-03-10 MED ORDER — MAGNESIUM HYDROXIDE 400 MG/5ML PO SUSP: 30.0000 mL | Freq: Every day | ORAL | Status: DC | PRN

## 2012-03-10 MED ORDER — RIFAMPIN 300 MG PO CAPS: 300.0000 mg | ORAL_CAPSULE | Freq: Two times a day (BID) | ORAL | Status: DC

## 2012-03-10 NOTE — Discharge Summary (Signed)
[name], MD   Jacqualine Code, PA-C [address] [place], [place]  [address]                             [phone]  PATIENT ID: [name]        MRN:  [id number]          DOB/AGE: 08-11-1925 / 78 y.o.    DISCHARGE SUMMARY  ADMISSION DATE:    03/08/2012 DISCHARGE DATE:   03/11/2012   ADMISSION DIAGNOSIS: Rt Total Knee Irrigation  Debridement Synovectomy Poly Exchange Removal Prosthesis 2 Antibiotic Spacer    DISCHARGE DIAGNOSIS:  infected right total knee with staph COAG negative bacteria    ADDITIONAL DIAGNOSIS: Active Problems:  * No active hospital problems. *   Past Medical History  Diagnosis Date  . Hypertension   . Thyroid disease   . Hiatal hernia   . Arthritis   . Diverticulosis   . Cancer     breast  . Cancer     Uterine  . Complication of anesthesia   . PONV (postoperative nausea and vomiting)   . Hypothyroidism     PROCEDURE: Procedure(s): IRRIGATION AND DEBRIDEMENT KNEE WITH POLY EXCHANGE [place] 03/08/2012  CONSULTS: INFECTIOUS DISEASE     HISTORY: Patient is a 78 y.o. female presented with a history of pain in the right knee. She had a total knee replacement placed in October 2009 by Dr. Erasmo Leventhal. She has had problems with motion with this at time. She had last seen him in April 2012. At that time she had been using tramadol intermittently for pain at least 2-3 times per day. She, unfortunately at the time of her surgery, had an injury to the popliteal artery and it was evaluated by Dr. Imogene Burn from vascular surgery. Apparently there was a hole in the lateral wall of the popliteal artery. Dr. Imogene Burn and Dr. Myra Gianotti did a bovine patch graft. She ended up, unfortunately, having arthrofibrosis postop and did have to have manipulation. She, unfortunately, has never really gone past 80 or so degrees. She states that overall she was tolerating this pain, however, on Sunday 02/28/2012 she was visiting a friend in the hospital and stood up and had pain  in the posterior and lateral aspect of the knee. There is no known injury other than getting up from a chair. She notes some swelling in the area. It also feels warm. She has had difficulty weightbearing and has actually increased her tramadol. She has pain even at nighttime when trying to turn over. She denies any neurovascular compromise. Evaluated on 12//06/2011 and an aspiration of the joint revealed very cloudy yellow fluid which was sent for studies as well as blood analysis.  They were abundant WBC in the gram stain, but no organisms were seen on day 1. The C-met was normal except for a slightly elevated glucose at 105, creatinine was 1.17, BUN was 19. The white cell count was 9,000. Hemoglobin 14.9, hematocrit reported 1.9. The C-reactive protein was significantly elevated at 14. The sed rate was 58.  The fluid color was yellow, it was turbid. There was 74,373 white cells, 86% were neutrophils. The glucose was <20 . There were no crystals.  Cultures grew staph coag negative susceptible to everything PCN.  She continues to have pain. Admitted for I&D with poly exchange and IV antibiotics,   HOSPITAL COURSE:  [name] is a 78 y.o. admitted on 03/08/2012 and found to  have a diagnosis of infected right total knee with staph long negative bacteria.  After appropriate laboratory studies were obtained  they were taken to the operating room on 03/08/2012 and underwent  Procedure(s): IRRIGATION AND DEBRIDEMENT KNEE WITH POLY EXCHANGE Right.   They were given perioperative antibiotics:  Anti-infectives     Start     Dose/Rate Route Frequency Ordered Stop   03/11/12 0815   ceFAZolin (ANCEF) IVPB 2 g/50 mL premix        2 g 100 mL/hr over 30 Minutes Intravenous 3 times per day 03/11/12 0807 03/14/12 0559   03/11/12 0000   ceFAZolin (ANCEF) 2-3 GM-% SOLR        2 g 100 mL/hr over 30 Minutes Intravenous Every 8 hours 03/11/12 0809     April 07, 2012 0000   ceFAZolin (ANCEF) 2-3 GM-% SOLR        2  g 100 mL/hr over 30 Minutes Intravenous Every 8 hours Apr 07, 2012 1139     04/07/12 0000   rifampin (RIFADIN) 300 MG capsule        300 mg Oral Every 12 hours 2012/04/07 1139     03/09/12 1800   rifampin (RIFADIN) capsule 300 mg        300 mg Oral Every 12 hours 03/09/12 1449     03/08/12 2359   ceFAZolin (ANCEF) IVPB 2 g/50 mL premix        2 g 100 mL/hr over 30 Minutes Intravenous 3 times per day 03/08/12 2028 2012-04-07 1405   03/08/12 1702   gentamicin (GARAMYCIN) injection  Status:  Discontinued          As needed 03/08/12 1703 03/08/12 1732   03/08/12 1700   vancomycin (VANCOCIN) powder  Status:  Discontinued          As needed 03/08/12 1701 03/08/12 1732        .  Tolerated the procedure well.  Placed with a foley intraoperatively.  Given Ofirmev at induction and for 48 hours.  Ancef 2gm IV q 8h started as per ID recommendation.  POD #1, allowed out of bed to a chair.  PT for ambulation and exercise program.  Foley D/C'd in morning.  IV saline locked.  O2 discontionued.  POD #2, continued PT and ambulation.  Hemovac pulled. Dressing changed.  PICC line placed . POD #3, continue PT, ambulation, and CPM.  The remainder of the hospital course was dedicated to ambulation and strengthening.   The patient was discharged on 3 Days Post-Op in  Stable condition.  Blood products given:none  DIAGNOSTIC STUDIES: Recent vital signs:  Patient Vitals for the past 24 hrs:  BP Temp Temp src Pulse Resp SpO2  03/11/12 0615 164/62 mmHg 99 F (37.2 C) Oral 70  16  97 %  04/07/2012 2308 158/72 mmHg - - - - -  April 07, 2012 2106 181/51 mmHg 99.5 F (37.5 C) Oral 71  - 100 %  April 07, 2012 1600 - - - - 18  -  2012/04/07 1400 132/72 mmHg 99.2 F (37.3 C) - 68  16  94 %  07-Apr-2012 1200 - - - - 18  -       Recent laboratory studies:  Basename 03/11/12 0500 Apr 07, 2012 0839 03/09/12 0520 03/08/12 1102  WBC 9.1 8.3 6.8 8.8  HGB 12.6 12.1 12.3 14.5  HCT 37.5 37.4 36.3 41.2  PLT 277 261 254 280    Basename  03/11/12 0500 2012-04-07 0839 03/09/12 0520 03/08/12 1102  NA 137 139 137 137  K 3.7 3.8 4.0 3.7  CL 96 99 102 97  CO2 31 30 27 29   BUN 10 12 14 17   CREATININE 0.97 1.07 2.13 1.00  GLUCOSE 100* 130* 80 98  CALCIUM 10.1 9.6 8.8 9.5   Lab Results  Component Value Date   INR 1.16 03/08/2012   INR 0.93 01/10/2010   INR 1.07 07/06/2009     Recent Radiographic Studies :   Chest 2 View  03/08/2012  *RADIOLOGY REPORT*  Clinical Data: Preop right total knee revision  CHEST - 2 VIEW  Comparison: 01/10/2010  Findings: Lungs are clear. No pleural effusion or pneumothorax.  Heart is top normal in size.  Degenerative changes of the visualized thoracolumbar spine.  Surgical clips in the left chest wall / axilla.  IMPRESSION: No evidence of acute cardiopulmonary disease.   Original Report Authenticated By: Charline Bills, M.D.     DISCHARGE INSTRUCTIONS:     Discharge Orders    Future Orders Please Complete By Expires   Diet - low sodium heart healthy      Call MD / Call 911      Comments:   If you experience chest pain or shortness of breath, CALL 911 and be transported to the hospital emergency room.  If you develope a fever above 101 F, pus (white drainage) or increased drainage or redness at the wound, or calf pain, call your surgeon's office.   Constipation Prevention      Comments:   Drink plenty of fluids.  Prune juice may be helpful.  You may use a stool softener, such as Colace (over the counter) 100 mg twice a day.  Use MiraLax (over the counter) for constipation as needed.   Increase activity slowly as tolerated      Patient may shower      Comments:   You may shower without a dressing once there is no drainage.  Do not wash over the wound.  If drainage remains, cover wound with plastic wrap and then shower.   Partial weight bearing      Comments:   50 % WEIGHT BEARING AS TAUGHT IN PHYSICAL THERAPY   Driving restrictions      Comments:   No driving for 6 weeks   Lifting  restrictions      Comments:   No lifting for 6 weeks   CPM      Comments:   Continuous passive motion machine (CPM):      Use the CPM from 0 to 60 for 6-8 hours per day.      You may increase by 5-10 per day.  You may break it up into 2 or 3 sessions per day.      Use CPM for 3-4 weeks or until you are told to stop.   TED hose      Comments:   Use stockings (TED hose) for 3 weeks on operative leg(s).  You may remove them at night for sleeping.   Change dressing      Comments:   Change dressing on SATURDAY, then change the dressing daily with sterile 4 x 4 inch gauze dressing and apply TED hose.  You may clean the incision with alcohol prior to redressing.   Do not put a pillow under the knee. Place it under the heel.         DISCHARGE MEDICATIONS:     Medication List     As of 03/11/2012  8:10 AM    STOP taking these  medications         traMADol 50 MG tablet   Commonly known as: ULTRAM      VIMOVO 500-20 MG Tbec   Generic drug: Naproxen-Esomeprazole      TAKE these medications         bisacodyl 10 MG suppository   Commonly known as: DULCOLAX   Place 1 suppository (10 mg total) rectally daily as needed.      ceFAZolin 2-3 GM-% Solr   Commonly known as: ANCEF   Inject 50 mLs (2 g total) into the vein every 8 (eight) hours.      ceFAZolin 2-3 GM-% Solr   Commonly known as: ANCEF   Inject 50 mLs (2 g total) into the vein every 8 (eight) hours.      DSS 100 MG Caps   Take 100 mg by mouth 2 (two) times daily.      enoxaparin 30 MG/0.3ML injection   Commonly known as: LOVENOX   Inject 0.3 mLs (30 mg total) into the skin every 12 (twelve) hours.      ergocalciferol 50000 UNITS capsule   Commonly known as: VITAMIN D2   Take 50,000 Units by mouth every 30 (thirty) days. Take on the 1st day of each month.      indapamide 1.25 MG tablet   Commonly known as: LOZOL   Take 1.25 mg by mouth daily.      levothyroxine 50 MCG tablet   Commonly known as: SYNTHROID,  LEVOTHROID   Take 50 mcg by mouth daily.      magnesium hydroxide 400 MG/5ML suspension   Commonly known as: MILK OF MAGNESIA   Take 30 mLs by mouth daily as needed.      methocarbamol 500 MG tablet   Commonly known as: ROBAXIN   Take 500 mg by mouth daily as needed. For cramps.      oxyCODONE 5 MG immediate release tablet   Commonly known as: Oxy IR/ROXICODONE   Take 1-2 tablets (5-10 mg total) by mouth every 4 (four) hours as needed.      pravastatin 40 MG tablet   Commonly known as: PRAVACHOL   Take 40 mg by mouth daily.      rifampin 300 MG capsule   Commonly known as: RIFADIN   Take 1 capsule (300 mg total) by mouth every 12 (twelve) hours.         FOLLOW UP VISIT:   Follow-up Information    Follow up with Valeria Batman, MD. On 03/21/2012.   Contact information:   [address] [place] [address] [phone]          DISPOSITION:  SNF  CONDITION:  Stable  PETRARCA,BRIAN 03/11/2012, 8:10 AM

## 2012-03-10 NOTE — Progress Notes (Signed)
Patient ID: [name], female   DOB: Jan 17, 1926, 78 y.o.   MRN: [id number] PATIENT ID: [name]        MRN:  [id number]          DOB/AGE: 08-08-1925 / 78 y.o.    [name], MD   Jacqualine Code, PA-C [address] [place], [place]  [address]                             [phone]   PROGRESS NOTE  Subjective:  negative for Chest Pain  negative for Shortness of Breath  negative for Nausea/Vomiting   negative for Calf Pain    Tolerating Diet: yes         Patient reports pain as mild and moderate.     Resting comfortably.  Awaiting PICC line.  Still researching SNF  Objective: Vital signs in last 24 hours:   Patient Vitals for the past 24 hrs:  BP Temp Pulse Resp SpO2  03/10/12 0800 - - - 18  -  03/10/12 0545 138/45 mmHg 98.5 F (36.9 C) 63  16  92 %  03/09/12 2006 150/56 mmHg 98.1 F (36.7 C) 55  16  96 %  03/09/12 1600 - - - 14  96 %  03/09/12 1313 162/55 mmHg 98.6 F (37 C) 62  18  97 %  03/09/12 1200 - - - 18  100 %      Intake/Output from previous day:   12/11 0701 - 12/12 0700 In: -  Out: 125 [Drains:125]   Intake/Output this shift:   12/12 0701 - 12/12 1900 In: 240 [P.O.:240] Out: -    Intake/Output      12/11 0701 - 12/12 0700 12/12 0701 - 12/13 0700   P.O.  240   I.V. (mL/kg)     IV Piggyback     Total Intake(mL/kg)  240 (3.1)   Urine (mL/kg/hr)     Drains 125    Blood     Total Output 125    Net -125 +240        Urine Occurrence 552 x 600 x      LABORATORY DATA:  Basename 03/10/12 0839 03/09/12 0520 03/08/12 1102  WBC 8.3 6.8 8.8  HGB 12.1 12.3 14.5  HCT 37.4 36.3 41.2  PLT 261 254 280    Basename 03/10/12 0839 03/09/12 0520 03/08/12 1102  NA 139 137 137  K 3.8 4.0 3.7  CL 99 102 97  CO2 30 27 29   BUN 12 14 17   CREATININE 1.07 1.07 1.00  GLUCOSE 130* 80 98  CALCIUM 9.6 8.8 9.5   Lab Results  Component Value Date   INR 1.16 03/08/2012   INR 0.93 01/10/2010   INR 1.07 07/06/2009    Recent Radiographic Studies :    Chest 2 View  03/08/2012  *RADIOLOGY REPORT*  Clinical Data: Preop right total knee revision  CHEST - 2 VIEW  Comparison: 01/10/2010  Findings: Lungs are clear. No pleural effusion or pneumothorax.  Heart is top normal in size.  Degenerative changes of the visualized thoracolumbar spine.  Surgical clips in the left chest wall / axilla.  IMPRESSION: No evidence of acute cardiopulmonary disease.   Original Report Authenticated By: Charline Bills, M.D.      Examination:  General appearance: alert, cooperative, mild distress and moderate distress Resp: clear to auscultation bilaterally Cardio: regular rate and rhythm GI: normal findings: bowel sounds normal  Wound  Exam: clean, dry, intact   Drainage:  None: wound tissue dry  Motor Exam: EHL, FHL, Anterior Tibial and Posterior Tibial Intact  Sensory Exam: Superficial Peroneal, Deep Peroneal and Tibial normal  Vascular Exam: Right dorsalis pedis artery has 1+ (weak) pulse  Assessment:    2 Days Post-Op  Procedure(s) (LRB): IRRIGATION AND DEBRIDEMENT KNEE WITH POLY EXCHANGE (Right)  ADDITIONAL DIAGNOSIS:  Active Problems:  * No active hospital problems. *   Acute Blood Loss Anemia asymptomatic   Plan: Physical Therapy as ordered Weight Bearing as Tolerated (WBAT)  DVT Prophylaxis:  Lovenox, Foot Pumps and TED hose  DISCHARGE PLAN: Skilled Nursing Facility/Rehab  DISCHARGE NEEDS: HHPT, HHRN, CPM, Walker, 3-in-1 comode seat and IV Antibiotics  PICC line today.  On cefazolin and rifampin         Jakyri Brunkhorst 03/10/2012 11:29 AM

## 2012-03-10 NOTE — Progress Notes (Signed)
CSW contacted [facility] and they will need to review their bed situation in the am before offering a bed. CSW faxed patient's clinicals to other facilities in case there are no beds available.  CSW will f/u with family in the am.   Sabino Niemann, MSW, [phone]

## 2012-03-10 NOTE — Progress Notes (Signed)
Peripherally Inserted Central Catheter/Midline Placement  The IV Nurse has discussed with the patient and/or persons authorized to consent for the patient, the purpose of this procedure and the potential benefits and risks involved with this procedure.  The benefits include less needle sticks, lab draws from the catheter and patient may be discharged home with the catheter.  Risks include, but not limited to, infection, bleeding, blood clot (thrombus formation), and puncture of an artery; nerve damage and irregular heat beat.  Alternatives to this procedure were also discussed.  PICC/Midline Placement Documentation        [name] 03/10/2012, 10:51 AM

## 2012-03-10 NOTE — Progress Notes (Signed)
Physical Therapy Treatment Patient Details Name: [name] MRN: [id number] DOB: 16-Dec-1925 Today's Date: 03/10/2012 Time: 4098-1191 PT Time Calculation (min): 13 min  PT Assessment / Plan / Recommendation Comments on Treatment Session  Pt slowly progressing towards goals with increased exercises performed today.    Follow Up Recommendations  SNF;Supervision/Assistance - 24 hour           Equipment Recommendations  None recommended by PT       Frequency 7X/week   Plan Discharge plan remains appropriate;Frequency remains appropriate    Precautions / Restrictions Precautions Precautions: Fall Restrictions RLE Weight Bearing: Weight bearing as tolerated       Mobility  Bed Mobility Details for Bed Mobility Assistance: pt seated EOB with OT on arrival to room Transfers Details for Transfer Assistance: OT perform stand pivot transfer, PTA was for safety only- see OT note from today for transfer details.    Exercises Total Joint Exercises Ankle Circles/Pumps: AROM;Both;10 reps;Seated Quad Sets: Strengthening;AROM;Right;10 reps;Seated Heel Slides: AAROM;Strengthening;Right;10 reps;Seated Hip ABduction/ADduction: AAROM;Strengthening;Right;10 reps;Seated Straight Leg Raises: AAROM;Strengthening;Right;10 reps;Seated Goniometric ROM: Pt with limited knee flexion with heel slides and guarding at approx 20 degrees preventing increased flexion AA/passively. Educated pt on importance of increased ROM for optiomal knee healing and to decrease swelling/stiffness    PT Goals Acute Rehab PT Goals PT Goal: Perform Home Exercise Program - Progress: Progressing toward goal  Visit Information  Last PT Received On: 03/10/12 Assistance Needed: +1    Subjective Data  Subjective: Pt currently EOB with OT, reporting 9/10 pain. On way to recliner at this time.   Cognition  Overall Cognitive Status: Appears within functional limits for tasks assessed/performed Arousal/Alertness:  Awake/alert Orientation Level: Appears intact for tasks assessed Behavior During Session: [facility] for tasks performed       End of Session PT - End of Session Equipment Utilized During Treatment: Gait belt Activity Tolerance: Patient limited by pain Patient left: in chair;with call bell/phone within reach Nurse Communication: Mobility status;Patient requests pain meds   GP     Sallyanne Kuster 03/10/2012, 2:20 PM  Sallyanne Kuster, PTA Office- [phone]

## 2012-03-10 NOTE — Progress Notes (Signed)
PT Cancellation Note  Patient Details Name: [name] MRN: [id number] DOB: 1926-02-22   Cancelled Treatment:  Pt currently receiving a sterile procedure in room (PICC line placement). Will followup later this morning or afternoon for PT.   Sallyanne Kuster 03/10/2012, 10:52 AM  Sallyanne Kuster, PTA Office- [phone]

## 2012-03-10 NOTE — Evaluation (Signed)
Occupational Therapy Evaluation Patient Details Name: [name] MRN: [id number] DOB: 01-31-1926 Today's Date: 03/10/2012 Time: 4098-1191 OT Time Calculation (min): 18 min  OT Assessment / Plan / Recommendation Clinical Impression  78 yo female s/p Rt TKA that coudl benefit from skilled OT acutely. Recommend SNF for d/c planning    OT Assessment  Patient needs continued OT Services    Follow Up Recommendations  SNF    Barriers to Discharge      Equipment Recommendations  3 in 1 bedside comode;Wheelchair (measurements OT);Wheelchair cushion (measurements OT)    Recommendations for Other Services    Frequency  Min 2X/week    Precautions / Restrictions Precautions Precautions: Fall Restrictions RLE Weight Bearing: Weight bearing as tolerated   Pertinent Vitals/Pain Pain reported without a number RN called  Ice applied to knee Repositioned in chair    ADL  Eating/Feeding: Set up Where Assessed - Eating/Feeding: Chair Lower Body Dressing: Minimal assistance Where Assessed - Lower Body Dressing: Supported sit to stand Toilet Transfer: Minimal assistance Toilet Transfer Method: Sit to stand Toilet Transfer Equipment: Raised toilet seat with arms (or 3-in-1 over toilet) Equipment Used: Gait belt;Rolling walker Transfers/Ambulation Related to ADLs: Pt moving with decreased speed and increased time Min (A) for stand pivot. pt limited to OOB to chair due to fatigue but agreeable to participate ADL Comments: PT able to reach ankle on operated LE. Pt will need (A) for LB dressing/ bathing. PT required (A) for bed mobility and reports pain during bed mobility. pt positioned in chair with PTA Olegario Messier arrival for exercises    OT Diagnosis: Generalized weakness;Acute pain  OT Problem List: Decreased strength;Decreased activity tolerance;Impaired balance (sitting and/or standing);Decreased safety awareness;Decreased knowledge of use of DME or AE;Decreased knowledge of  precautions;Pain OT Treatment Interventions: Self-care/ADL training;DME and/or AE instruction;Therapeutic activities;Patient/family education;Balance training   OT Goals Acute Rehab OT Goals OT Goal Formulation: With patient Time For Goal Achievement: 03/24/12 Potential to Achieve Goals: Good ADL Goals Pt Will Transfer to Toilet: with supervision;Ambulation;3-in-1 ADL Goal: Toilet Transfer - Progress: Goal set today Pt Will Perform Toileting - Clothing Manipulation: with supervision;Sitting on 3-in-1 or toilet ADL Goal: Toileting - Clothing Manipulation - Progress: Goal set today Miscellaneous OT Goals Miscellaneous OT Goal #1: Pt will complete bed mobility with rail Min (A) as precursor to adls OT Goal: Miscellaneous Goal #1 - Progress: Goal set today  Visit Information  Last OT Received On: 03/10/12 Assistance Needed: +1    Subjective Data  Subjective: "i just have had no rest today"- pt reports feeling fatigued due to constant staff arrival Patient Stated Goal: to get some sleep   Prior Functioning     Home Living Lives With: Alone Available Help at Discharge: Skilled Nursing Facility Type of Home: House Home Access: Stairs to enter Secretary/administrator of Steps: 5 Entrance Stairs-Rails: Right Home Layout: One level Bathroom Shower/Tub: Engineer, manufacturing systems: Standard Bathroom Accessibility: Yes How Accessible: Accessible via walker Home Adaptive Equipment: Walker - rolling Prior Function Level of Independence: Independent Able to Take Stairs?: Yes Driving: Yes Vocation: Retired Musician: No difficulties Dominant Hand: Right         Vision/Perception     Cognition  Overall Cognitive Status: Appears within functional limits for tasks assessed/performed Arousal/Alertness: Awake/alert Orientation Level: Appears intact for tasks assessed Behavior During Session: [facility] for tasks performed    Extremity/Trunk Assessment Right  Upper Extremity Assessment RUE ROM/Strength/Tone: Within functional levels Left Upper Extremity Assessment LUE ROM/Strength/Tone: Within functional levels Trunk  Assessment Trunk Assessment: Normal     Mobility Bed Mobility Supine to Sit: 3: Mod assist;HOB elevated;With rails Sitting - Scoot to Edge of Bed: 3: Mod assist;With rail (using pad to (A)) Details for Bed Mobility Assistance: pt needed v/c for hand placement and rocking motion for momentum to grab rail. Pt is unable to log roll onto side without (A) Transfers Transfers: Sit to Stand;Stand to Sit Sit to Stand: 4: Min assist;With upper extremity assist;From bed Stand to Sit: 4: Min assist;With upper extremity assist;To chair/3-in-1 Details for Transfer Assistance: pt with min v/c for hand placement for saftey           Balance     End of Session OT - End of Session Activity Tolerance: Patient tolerated treatment well Patient left: in chair;with call bell/phone within reach Nurse Communication: Mobility status;Precautions  GO     Lucile Shutters 03/10/2012, 2:32 PM Pager: [phone]

## 2012-03-11 ENCOUNTER — Encounter (HOSPITAL_COMMUNITY): Payer: Medicare Other

## 2012-03-11 DIAGNOSIS — M7989 Other specified soft tissue disorders: Secondary | ICD-10-CM

## 2012-03-11 DIAGNOSIS — M79609 Pain in unspecified limb: Secondary | ICD-10-CM

## 2012-03-11 LAB — CBC
HCT: 37.5 % (ref 36.0–46.0)
Hemoglobin: 12.6 g/dL (ref 12.0–15.0)
MCH: 30.2 pg (ref 26.0–34.0)
MCHC: 33.6 g/dL (ref 30.0–36.0)
MCV: 89.9 fL (ref 78.0–100.0)
Platelets: 277 10*3/uL (ref 150–400)
RBC: 4.17 MIL/uL (ref 3.87–5.11)
RDW: 13 % (ref 11.5–15.5)
WBC: 9.1 10*3/uL (ref 4.0–10.5)

## 2012-03-11 LAB — BASIC METABOLIC PANEL
CO2: 31 mEq/L (ref 19–32)
Chloride: 96 mEq/L (ref 96–112)
Creatinine, Ser: 0.97 mg/dL (ref 0.50–1.10)
Glucose, Bld: 100 mg/dL — ABNORMAL HIGH (ref 70–99)

## 2012-03-11 LAB — TISSUE CULTURE: Culture: NO GROWTH

## 2012-03-11 LAB — CULTURE, ROUTINE-ABSCESS: Culture: NO GROWTH

## 2012-03-11 MED ORDER — CEFAZOLIN SODIUM-DEXTROSE 2-3 GM-% IV SOLR: 2.0000 g | Freq: Three times a day (TID) | INTRAVENOUS | Status: DC

## 2012-03-11 MED ORDER — HEPARIN SOD (PORK) LOCK FLUSH 100 UNIT/ML IV SOLN
250.0000 [IU] | INTRAVENOUS | Status: AC | PRN
  Administered 2012-03-11: 250 [IU]

## 2012-03-11 MED ORDER — CEFAZOLIN SODIUM-DEXTROSE 2-3 GM-% IV SOLR
2.0000 g | Freq: Three times a day (TID) | INTRAVENOUS | Status: DC
  Administered 2012-03-11: 2 g via INTRAVENOUS
  Filled 2012-03-11 (×4): qty 50

## 2012-03-11 NOTE — Progress Notes (Signed)
Physical Therapy Treatment Patient Details Name: [name] MRN: [id number] DOB: 1925-10-13 Today's Date: 03/11/2012 Time: 1430-     PT Assessment / Plan / Recommendation Comments on Treatment Session  mobility improving.      Follow Up Recommendations  SNF;Supervision/Assistance - 24 hour     Does the patient have the potential to tolerate intense rehabilitation  *    Barriers to Discharge        Equipment Recommendations  None recommended by PT    Recommendations for Other Services    Frequency 7X/week   Plan Discharge plan remains appropriate;Frequency remains appropriate    Precautions / Restrictions Precautions Precautions: Fall Restrictions Weight Bearing Restrictions: Yes RUE Weight Bearing: Weight bearing as tolerated RLE Weight Bearing: Weight bearing as tolerated   Pertinent Vitals/Pain Pt reporting 3/10 pain in R knee.  Applied ice to knee at end of session. Notified RN of pt desire for pain medication. RN provided pt with medication during session.     Mobility  Bed Mobility Bed Mobility: Not assessed Transfers Transfers: Sit to Stand;Stand to Sit Sit to Stand: 4: Min assist;With upper extremity assist;From bed Stand to Sit: 4: Min assist;With upper extremity assist;To chair/3-in-1 Details for Transfer Assistance: assist to steady pt, cues for hand placement and increased WB on  R LE.  Ambulation/Gait Ambulation/Gait Assistance: 4: Min guard Ambulation Distance (Feet): 35 Feet Assistive device: Rolling walker Ambulation/Gait Assistance Details: Cueing for gait sequencing.   Pt fatigued quickly.  Gait Pattern: Step-to pattern;Decreased stance time - right;Decreased hip/knee flexion - right;Decreased weight shift to right Gait velocity: slow Stairs: No    Exercises     PT Diagnosis:    PT Problem List:   PT Treatment Interventions:     PT Goals Acute Rehab PT Goals PT Goal Formulation: With patient/family Time For Goal Achievement:  03/16/12 Potential to Achieve Goals: Good Pt will go Supine/Side to Sit: with supervision;with HOB 0 degrees PT Goal: Supine/Side to Sit - Progress: Not met Pt will go Sit to Stand: with supervision PT Goal: Sit to Stand - Progress: Not met Pt will Transfer Bed to Chair/Chair to Bed: with supervision PT Transfer Goal: Bed to Chair/Chair to Bed - Progress: Progressing toward goal Pt will Ambulate: with supervision PT Goal: Ambulate - Progress: Progressing toward goal Pt will Perform Home Exercise Program: Independently PT Goal: Perform Home Exercise Program - Progress: Not met  Visit Information  Last PT Received On: 03/11/12    Subjective Data  Subjective: I am leaving for (SNF) today.  Patient Stated Goal: Walk without pain.    Cognition  Overall Cognitive Status: Appears within functional limits for tasks assessed/performed Arousal/Alertness: Awake/alert Orientation Level: Appears intact for tasks assessed Behavior During Session: [facility] for tasks performed    Balance     End of Session PT - End of Session Equipment Utilized During Treatment: Gait belt Activity Tolerance: Patient limited by fatigue;Patient limited by pain Patient left: in chair;with call bell/phone within reach Nurse Communication: Mobility status;Patient requests pain meds   GP     [name] 03/11/2012, 3:39 PM

## 2012-03-11 NOTE — Progress Notes (Signed)
*  Preliminary Results* Right lower extremity venous duplex completed. Right lower extremity is negative for deep vein thrombosis. No evidence of right Baker's cyst.  03/11/2012 10:15 AM Gertie Fey, RDMS, RDCS

## 2012-03-11 NOTE — Progress Notes (Signed)
Clinical social worker assisted with patient discharge to skilled nursing facility, [facility].  CSW addressed all family questions and concerns. CSW copied chart and added all important documents. CSW also set up patient transportation with [employer]. Clinical Social Worker will sign off for now as social work intervention is no longer needed.   Sabino Niemann, MSW, [employer] [phone]

## 2012-03-11 NOTE — Progress Notes (Signed)
Patient has a bed at [facility]. CSW will send clinicals for patient's d/c to the facility. Sabino Niemann, MSW, [phone]

## 2012-03-11 NOTE — Progress Notes (Signed)
Patient ID: Tasmin Exantus, female   DOB: 06-Jun-1925, 78 y.o.   MRN: [id number] PATIENT ID: Aizah Gehlhausen        MRN:  [id number]          DOB/AGE: Oct 26, 1925 / 78 y.o.    [name], MD   Jacqualine Code, PA-C [address] [place], [place]  [address]                             [phone]   PROGRESS NOTE  Subjective:  negative for Chest Pain  negative for Shortness of Breath  negative for Nausea/Vomiting   positive for Calf Pain    Tolerating Diet: yes         Patient reports pain as mild.     Will obtain Doppler this am  Objective: Vital signs in last 24 hours:   Patient Vitals for the past 24 hrs:  BP Temp Temp src Pulse Resp SpO2  03/11/12 0615 164/62 mmHg 99 F (37.2 C) Oral 70  16  97 %  03/10/12 2308 158/72 mmHg - - - - -  03/10/12 2106 181/51 mmHg 99.5 F (37.5 C) Oral 71  - 100 %  03/10/12 1600 - - - - 18  -  03/10/12 1400 132/72 mmHg 99.2 F (37.3 C) - 68  16  94 %  03/10/12 1200 - - - - 18  -      Intake/Output from previous day:   12/12 0701 - 12/13 0700 In: 960 [P.O.:960] Out: -    Intake/Output this shift:       Intake/Output      12/12 0701 - 12/13 0700 12/13 0701 - 12/14 0700   P.O. 960    Total Intake(mL/kg) 960 (12.3)    Drains     Total Output     Net +960         Urine Occurrence 605 x       LABORATORY DATA:  Basename 03/11/12 0500 03/10/12 0839 03/09/12 0520 03/08/12 1102  WBC 9.1 8.3 6.8 8.8  HGB 12.6 12.1 12.3 14.5  HCT 37.5 37.4 36.3 41.2  PLT 277 261 254 280    Basename 03/11/12 0500 03/10/12 0839 03/09/12 0520 03/08/12 1102  NA 137 139 137 137  K 3.7 3.8 4.0 3.7  CL 96 99 102 97  CO2 31 30 27 29   BUN 10 12 14 17   CREATININE 0.97 1.07 1.07 1.00  GLUCOSE 100* 130* 80 98  CALCIUM 10.1 9.6 8.8 9.5   Lab Results  Component Value Date   INR 1.16 03/08/2012   INR 0.93 01/10/2010   INR 1.07 07/06/2009    Recent Radiographic Studies :   Chest 2 View  03/08/2012  *RADIOLOGY REPORT*  Clinical Data: Preop right total  knee revision  CHEST - 2 VIEW  Comparison: 01/10/2010  Findings: Lungs are clear. No pleural effusion or pneumothorax.  Heart is top normal in size.  Degenerative changes of the visualized thoracolumbar spine.  Surgical clips in the left chest wall / axilla.  IMPRESSION: No evidence of acute cardiopulmonary disease.   Original Report Authenticated By: Charline Bills, M.D.      Examination:  General appearance: alert and no distress  Wound Exam: clean, dry, intact   Drainage:  None: wound tissue dry  Motor Exam: EHL, FHL, Anterior Tibial and Posterior Tibial Intact  Sensory Exam: Superficial Peroneal, Deep Peroneal and Tibial normal  Vascular Exam: Normal  Assessment:    3 Days Post-Op  Procedure(s) (LRB): IRRIGATION AND DEBRIDEMENT KNEE WITH POLY EXCHANGE (Right)  ADDITIONAL DIAGNOSIS:  Active Problems:  * No active hospital problems. *   No new problems   Plan: Physical Therapy as ordered Weight Bearing as Tolerated (WBAT)  DVT Prophylaxis:  Xarelto  DISCHARGE PLAN: Skilled Nursing Facility/Rehab  DISCHARGE NEEDS: has equipment   D/C today pending facility placement, cultures right knee neg so far, but a few staph neg on cultures prior to surgery-ID to treat with IV Kefazol and rifampin     [name] W 03/11/2012 8:05 AM

## 2012-03-11 NOTE — Progress Notes (Addendum)
CARE MANAGEMENT NOTE 03/11/2012  Patient:  [name], [name]   Account Number:  [id number]  Date Initiated:  03/09/2012  Documentation initiated by:  Vance Peper  Subjective/Objective Assessment:   78 yr old female s/p right total knee replacement I&D,synovectomy and polyethylene exchange.     Action/Plan:   Patient is for shortterm rehab at [facility]. patient wants [facility]. Social Worker is aware.  Preoperatively setup with [facility].   Anticipated DC Date:  03/11/2012   Anticipated DC Plan:  SKILLED NURSING FACILITY  In-house referral  Clinical Social Worker      DC Planning Services  CM consult      Choice offered to / List presented to:             [facility] agency  [facility]   Status of service:  Completed, signed off Medicare Important Message given?   (If response is "NO", the following Medicare IM given date fields will be blank) Date Medicare IM given:   Date Additional Medicare IM given:    Discharge Disposition:  SKILLED NURSING FACILITY  Per UR Regulation:    If discussed at Long Length of Stay Meetings, dates discussed:    Comments:  03/11/12 11:46 Vance Peper, RN BSN Case Manager Patient will be discharged to [facility] with PICC line for IV ANCEF 42 days- tentatively until January 22,2014

## 2012-03-13 LAB — ANAEROBIC CULTURE

## 2012-04-06 ENCOUNTER — Ambulatory Visit (INDEPENDENT_AMBULATORY_CARE_PROVIDER_SITE_OTHER): Payer: Medicare (Managed Care) | Admitting: Infectious Disease

## 2012-04-06 ENCOUNTER — Encounter: Payer: Self-pay | Admitting: Infectious Disease

## 2012-04-06 VITALS — BP 144/83 | HR 79 | Temp 98.7°F | Wt 162.0 lb

## 2012-04-06 DIAGNOSIS — M009 Pyogenic arthritis, unspecified: Secondary | ICD-10-CM

## 2012-04-06 DIAGNOSIS — T8450XA Infection and inflammatory reaction due to unspecified internal joint prosthesis, initial encounter: Secondary | ICD-10-CM

## 2012-04-06 DIAGNOSIS — B957 Other staphylococcus as the cause of diseases classified elsewhere: Secondary | ICD-10-CM

## 2012-04-06 MED ORDER — CEPHALEXIN 500 MG PO CAPS: 1000.0000 mg | ORAL_CAPSULE | Freq: Two times a day (BID) | ORAL | Status: DC

## 2012-04-06 NOTE — Progress Notes (Signed)
Subjective:    Patient ID: [name], female    DOB: 21-Apr-1925, 79 y.o.   MRN: [id number]  HPI  79 y.o. female who is sp total knee replacement placed in October 2009 by Dr. Erasmo Leventhal. She has had problems with motion with this at times. She had last seen him in April 2012. At that time she had been using tramadol intermittently for pain at least 2-3 times per day. On 02/28/2012 she was visiting a friend in the hospital and stood up and had pain in the posterior and lateral aspect of the knee. There is no known injury other than getting up from a chair. She notes some swelling in the area. It also feels warm. She has had difficulty weightbearing and has actually increased her tramadol. She has pain even at nighttime when trying to turn over. She denies any neurovascular compromise. Evaluated on 12//06/2011 and an aspiration of the joint revealed very cloudy yellow fluid which was sent for studies as well as blood analysis.  They were abundant WBC in the gram stain, but no organisms were seen on day  The fluid color was yellow, it was turbid. There was 74,373 white cells, 86% were neutrophils. The glucose was <20 . There were no crystals.  Cultures grew staph coag negative staph R to PCN but otherwise susceptible OXacilin, CEF, azithro, cipro, levo, vanco, clinda, rifampin,tetracycline  The C-reactive protein was significantly elevated at 14. The sed rate was 58. Patient was brought to the OR and underwent irrigation,  debridement, and synovectomy, and polyethylene exchange on 03/09/12. We placed the patient on high dose ancef 2g iv every 8 hours and rifampin 300 mg twice daily. He has been discharged to skilled nursing facility where she is residing currently. Her knee pain is improved dramatically on antibiotics she is now has pain largely with more vigorous physical activity such as but is getting physical therapy. She is without significant swelling in the knee and no fevers chills malaise or  other systemic symptoms. I spent greater than 45 minutes with the patient including greater than 50% of time in face to face counsel of the patient and in coordination of their care.    Review of Systems  Constitutional: Negative for fever, chills, diaphoresis, activity change, appetite change, fatigue and unexpected weight change.  HENT: Negative for congestion, sore throat, rhinorrhea, sneezing, trouble swallowing and sinus pressure.   Eyes: Negative for photophobia and visual disturbance.  Respiratory: Negative for cough, chest tightness, shortness of breath, wheezing and stridor.   Cardiovascular: Negative for chest pain, palpitations and leg swelling.  Gastrointestinal: Negative for nausea, vomiting, abdominal pain, diarrhea, constipation, blood in stool, abdominal distention and anal bleeding.  Genitourinary: Negative for dysuria, hematuria, flank pain and difficulty urinating.  Musculoskeletal: Negative for myalgias, back pain, joint swelling, arthralgias and gait problem.  Skin: Negative for color change, pallor, rash and wound.  Neurological: Negative for dizziness, tremors, weakness and light-headedness.  Hematological: Negative for adenopathy. Does not bruise/bleed easily.  Psychiatric/Behavioral: Negative for behavioral problems, confusion, sleep disturbance, dysphoric mood, decreased concentration and agitation.       Objective:   Physical Exam  Constitutional: She is oriented to person, place, and time. She appears well-developed and well-nourished. No distress.  HENT:  Head: Normocephalic and atraumatic.  Mouth/Throat: Oropharynx is clear and moist. No oropharyngeal exudate.  Eyes: Conjunctivae normal and EOM are normal. Pupils are equal, round, and reactive to light. No scleral icterus.  Neck: Normal range of motion. Neck supple.  No JVD present.  Cardiovascular: Normal rate, regular rhythm and normal heart sounds.  Exam reveals no gallop and no friction rub.   No murmur  heard. Pulmonary/Chest: Effort normal and breath sounds normal. No respiratory distress. She has no wheezes. She has no rales. She exhibits no tenderness.  Abdominal: She exhibits no distension and no mass. There is no tenderness. There is no rebound and no guarding.  Musculoskeletal: She exhibits no edema and no tenderness.       Arms:      Legs: Lymphadenopathy:    She has no cervical adenopathy.  Neurological: She is alert and oriented to person, place, and time. She exhibits normal muscle tone. Coordination normal.  Skin: Skin is warm and dry. She is not diaphoretic. No erythema. No pallor.  Psychiatric: She has a normal mood and affect. Her behavior is normal. Judgment and thought content normal.          Assessment & Plan:   Septic prosthetic joint with CNS: finish IV ancef and oral rifampin through 22nd of January then change to oral keflex 1 g bid and rifampin 300mg  po bid to finish at least 6 months or therapy  CNS: culprit organism

## 2012-04-06 NOTE — Patient Instructions (Addendum)
Please continue the IV antibiotics cefazolin until January 04/20/12 with rifampin orally 300mg  twice daily  Then I want you to start  Keflex 500mg  take two tablets twice daily  Along with  Rifampin 300mg  twice daily until directed to stop by Dr. Daiva Eves

## 2012-04-07 ENCOUNTER — Telehealth: Payer: Self-pay | Admitting: *Deleted

## 2012-04-07 NOTE — Telephone Encounter (Signed)
COrrect thanks Annice Pih!

## 2012-04-07 NOTE — Telephone Encounter (Signed)
Received call from Home Health for an order to pull picc at end of IV therapy on 04/20/12. Per Dr. Clinton Gallant office note verbal order given to pull picc line on 04/20/12 at which time patient will start oral antibiotics. Wendall Mola CMA

## 2012-05-11 ENCOUNTER — Ambulatory Visit: Payer: Medicare Other | Admitting: Infectious Disease

## 2012-05-12 ENCOUNTER — Ambulatory Visit: Payer: Medicare Other | Admitting: Infectious Disease

## 2012-05-18 ENCOUNTER — Ambulatory Visit (INDEPENDENT_AMBULATORY_CARE_PROVIDER_SITE_OTHER): Payer: Medicare Other | Admitting: Infectious Disease

## 2012-05-18 ENCOUNTER — Encounter: Payer: Self-pay | Admitting: Infectious Disease

## 2012-05-18 VITALS — BP 122/70 | HR 65 | Temp 98.4°F | Wt 167.0 lb

## 2012-05-18 DIAGNOSIS — B957 Other staphylococcus as the cause of diseases classified elsewhere: Secondary | ICD-10-CM

## 2012-05-18 DIAGNOSIS — M009 Pyogenic arthritis, unspecified: Secondary | ICD-10-CM

## 2012-05-18 LAB — COMPLETE METABOLIC PANEL WITH GFR
Albumin: 3.9 g/dL (ref 3.5–5.2)
CO2: 29 mEq/L (ref 19–32)
Chloride: 101 mEq/L (ref 96–112)
GFR, Est African American: 49 mL/min — ABNORMAL LOW
GFR, Est Non African American: 42 mL/min — ABNORMAL LOW
Glucose, Bld: 118 mg/dL — ABNORMAL HIGH (ref 70–99)
Potassium: 3.9 mEq/L (ref 3.5–5.3)
Sodium: 137 mEq/L (ref 135–145)
Total Protein: 7 g/dL (ref 6.0–8.3)

## 2012-05-18 LAB — CBC WITH DIFFERENTIAL/PLATELET
Hemoglobin: 13.8 g/dL (ref 12.0–15.0)
Lymphs Abs: 1.8 10*3/uL (ref 0.7–4.0)
Monocytes Relative: 7 % (ref 3–12)
Neutro Abs: 4.2 10*3/uL (ref 1.7–7.7)
Neutrophils Relative %: 64 % (ref 43–77)
RBC: 4.54 MIL/uL (ref 3.87–5.11)

## 2012-05-18 LAB — SEDIMENTATION RATE: Sed Rate: 22 mm/hr (ref 0–22)

## 2012-05-18 LAB — C-REACTIVE PROTEIN: CRP: 1 mg/dL — ABNORMAL HIGH (ref <0.60)

## 2012-05-18 MED ORDER — CEPHALEXIN 500 MG PO CAPS: 1000.0000 mg | ORAL_CAPSULE | Freq: Two times a day (BID) | ORAL | Status: DC

## 2012-05-18 MED ORDER — RIFAMPIN 300 MG PO CAPS: 300.0000 mg | ORAL_CAPSULE | Freq: Two times a day (BID) | ORAL | Status: DC

## 2012-05-18 NOTE — Progress Notes (Signed)
Subjective:    Patient ID: [name], female    DOB: Apr 24, 1925, 79 y.o.   MRN: [id number]  HPI  79 y.o. female who is sp total knee replacement placed in October 2009 by Dr. Erasmo Leventhal. She has had problems with motion with this at times. She had last seen him in April 2012. At that time she had been using tramadol intermittently for pain at least 2-3 times per day. On 02/28/2012 she was visiting a friend in the hospital and stood up and had pain in the posterior and lateral aspect of the knee. There is no known injury other than getting up from a chair. She notes some swelling in the area. It also feels warm. She has had difficulty weightbearing and has actually increased her tramadol. She has pain even at nighttime when trying to turn over. She denies any neurovascular compromise. Evaluated on 12//06/2011 and an aspiration of the joint revealed very cloudy yellow fluid which was sent for studies as well as blood analysis.  They were abundant WBC in the gram stain, but no organisms were seen on day  The fluid color was yellow, it was turbid. There was 74,373 white cells, 86% were neutrophils. The glucose was <20 . There were no crystals.  Cultures grew staph coag negative staph R to PCN but otherwise susceptible OXacilin, CEF, azithro, cipro, levo, vanco, clinda, rifampin,tetracycline  The C-reactive protein was significantly elevated at 14. The sed rate was 58. Patient was brought to the OR and underwent irrigation,  debridement, and synovectomy, and polyethylene exchange on 03/09/12. We placed the patient on high dose ancef 2g iv every 8 hours and rifampin 300 mg twice daily. She had been discharged to skilled nursing facility where she is residing currently. Her knee pain is improved dramatically on antibiotics she is now has pain largely with more vigorous physical activity such as but is getting physical therapy.   She does still have some as mentioned her pain is dramatically improved. She  has been transitioned to oral Keflex and rifampin.   Note she does have some chronic left lower extremity cold sensation in her fetal she does have palpable pulses.   Review of Systems  Constitutional: Negative for fever, chills, diaphoresis, activity change, appetite change, fatigue and unexpected weight change.  HENT: Negative for congestion, sore throat, rhinorrhea, sneezing, trouble swallowing and sinus pressure.   Eyes: Negative for photophobia and visual disturbance.  Respiratory: Negative for cough, chest tightness, shortness of breath, wheezing and stridor.   Cardiovascular: Negative for chest pain, palpitations and leg swelling.  Gastrointestinal: Negative for nausea, vomiting, abdominal pain, diarrhea, constipation, blood in stool, abdominal distention and anal bleeding.  Genitourinary: Negative for dysuria, hematuria, flank pain and difficulty urinating.  Musculoskeletal: Positive for myalgias, joint swelling and arthralgias. Negative for back pain and gait problem.  Skin: Negative for color change, pallor, rash and wound.  Neurological: Negative for dizziness, tremors, weakness and light-headedness.  Hematological: Negative for adenopathy. Does not bruise/bleed easily.  Psychiatric/Behavioral: Negative for behavioral problems, confusion, sleep disturbance, dysphoric mood, decreased concentration and agitation.       Objective:   Physical Exam  Constitutional: She is oriented to person, place, and time. She appears well-developed and well-nourished. No distress.  HENT:  Head: Normocephalic and atraumatic.  Mouth/Throat: Oropharynx is clear and moist. No oropharyngeal exudate.  Eyes: Conjunctivae and EOM are normal. Pupils are equal, round, and reactive to light. No scleral icterus.  Neck: Normal range of motion. Neck supple. No  JVD present.  Cardiovascular: Normal rate, regular rhythm and normal heart sounds.  Exam reveals no gallop and no friction rub.   No murmur  heard. Pulmonary/Chest: Effort normal and breath sounds normal. No respiratory distress. She has no wheezes. She has no rales. She exhibits no tenderness.  Abdominal: She exhibits no distension and no mass. There is no tenderness. There is no rebound and no guarding.  Musculoskeletal: She exhibits no edema and no tenderness.       Right knee: She exhibits decreased range of motion, swelling, effusion and erythema.       Legs: Lymphadenopathy:    She has no cervical adenopathy.  Neurological: She is alert and oriented to person, place, and time. She exhibits normal muscle tone. Coordination normal.  Skin: Skin is warm and dry. She is not diaphoretic. No erythema.  Psychiatric: She has a normal mood and affect. Her behavior is normal. Judgment and thought content normal.          Assessment & Plan:  Prosthetic joint infection: Continue Keflex and rifampin to finish a six-month course check sedimentation rate and C-reactive protein today.  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 We'll continue her on Keflex and rifampin.

## 2012-06-01 ENCOUNTER — Encounter: Payer: Self-pay | Admitting: Infectious Disease

## 2012-07-26 ENCOUNTER — Encounter: Payer: Self-pay | Admitting: Infectious Disease

## 2012-08-16 ENCOUNTER — Encounter: Payer: Self-pay | Admitting: Infectious Disease

## 2012-08-16 ENCOUNTER — Ambulatory Visit (INDEPENDENT_AMBULATORY_CARE_PROVIDER_SITE_OTHER): Payer: Medicare (Managed Care) | Admitting: Infectious Disease

## 2012-08-16 VITALS — BP 177/84 | HR 60 | Temp 98.2°F | Ht 64.0 in | Wt 162.0 lb

## 2012-08-16 DIAGNOSIS — M1710 Unilateral primary osteoarthritis, unspecified knee: Secondary | ICD-10-CM

## 2012-08-16 DIAGNOSIS — B957 Other staphylococcus as the cause of diseases classified elsewhere: Secondary | ICD-10-CM

## 2012-08-16 DIAGNOSIS — M009 Pyogenic arthritis, unspecified: Secondary | ICD-10-CM

## 2012-08-16 DIAGNOSIS — M1711 Unilateral primary osteoarthritis, right knee: Secondary | ICD-10-CM

## 2012-08-16 DIAGNOSIS — M25569 Pain in unspecified knee: Secondary | ICD-10-CM

## 2012-08-16 NOTE — Progress Notes (Signed)
Subjective:    Patient ID: [name], female    DOB: May 04, 1925, 79 y.o.   MRN: [id number]  HPI  79 y.o. female who is sp total knee replacement placed in October 2009 by Dr. Erasmo Leventhal. She has had problems with motion with this at times. She had last seen him in April 2012. At that time she had been using tramadol intermittently for pain at least 2-3 times per day. On 02/28/2012 she was visiting a friend in the hospital and stood up and had pain in the posterior and lateral aspect of the knee. There is no known injury other than getting up from a chair. She notes some swelling in the area. It also feels warm. She has had difficulty weightbearing and has actually increased her tramadol. She has pain even at nighttime when trying to turn over. She denies any neurovascular compromise. Evaluated on 12//06/2011 and an aspiration of the joint revealed very cloudy yellow fluid which was sent for studies as well as blood analysis.  They were abundant WBC in the gram stain, but no organisms were seen on day  The fluid color was yellow, it was turbid. There was 74,373 white cells, 86% were neutrophils. The glucose was <20 . There were no crystals.  Cultures grew staph coag negative staph R to PCN but otherwise susceptible OXacilin, CEF, azithro, cipro, levo, vanco, clinda, rifampin,tetracycline  The C-reactive protein was significantly elevated at 14. The sed rate was 58. Patient was brought to the OR and underwent irrigation,  debridement, and synovectomy, and polyethylene exchange on 03/09/12. We placed the patient on high dose ancef 2g iv every 8 hours and rifampin 300 mg twice daily. She had been discharged to skilled nursing facility Her knee pain has been improved dramatically on antibiotics she is now has pain largely with more vigorous physical activity such as but is getting physical therapy. Since I last saw her though, her dose of narcotics and he escalated and she was having more pain. A couple  weeks ago she also for no apparent reason began having pain constantly but this resolved within 24 hours. She was seen by Dr. [name] and returns to see [place] today. We had planned on finishing half a year of antibiotics orally with Keflex and rifampin and then stopping these  but given her recent symptoms of severe pain a few weeks ago we were reluctant to do so. Therefore will extend her therapy for another 2 months. We will check inflammatory markers today.   Review of Systems  Constitutional: Negative for fever, chills, diaphoresis, activity change, appetite change, fatigue and unexpected weight change.  HENT: Negative for congestion, sore throat, rhinorrhea, sneezing, trouble swallowing and sinus pressure.   Eyes: Negative for photophobia and visual disturbance.  Respiratory: Negative for cough, chest tightness, shortness of breath, wheezing and stridor.   Cardiovascular: Negative for chest pain, palpitations and leg swelling.  Gastrointestinal: Negative for nausea, vomiting, abdominal pain, diarrhea, constipation, blood in stool, abdominal distention and anal bleeding.  Genitourinary: Negative for dysuria, hematuria, flank pain and difficulty urinating.  Musculoskeletal: Positive for myalgias, joint swelling and arthralgias. Negative for back pain and gait problem.  Skin: Negative for color change, pallor, rash and wound.  Neurological: Negative for dizziness, tremors, weakness and light-headedness.  Hematological: Negative for adenopathy. Does not bruise/bleed easily.  Psychiatric/Behavioral: Negative for behavioral problems, confusion, sleep disturbance, dysphoric mood, decreased concentration and agitation.       Objective:   Physical Exam  Constitutional: She is  oriented to person, place, and time. She appears well-developed and well-nourished. No distress.  HENT:  Head: Normocephalic and atraumatic.  Mouth/Throat: Oropharynx is clear and moist. No oropharyngeal exudate.    Eyes: Conjunctivae and EOM are normal. Pupils are equal, round, and reactive to light. No scleral icterus.  Neck: Normal range of motion. Neck supple. No JVD present.  Cardiovascular: Normal rate, regular rhythm and normal heart sounds.  Exam reveals no gallop and no friction rub.   No murmur heard. Pulmonary/Chest: Effort normal and breath sounds normal. No respiratory distress. She has no wheezes. She has no rales. She exhibits no tenderness.  Abdominal: She exhibits no distension and no mass. There is no tenderness. There is no rebound and no guarding.  Musculoskeletal: She exhibits no edema and no tenderness.       Right knee: She exhibits decreased range of motion, swelling, effusion and erythema.       Legs: Lymphadenopathy:    She has no cervical adenopathy.  Neurological: She is alert and oriented to person, place, and time. She exhibits normal muscle tone. Coordination normal.  Skin: Skin is warm and dry. She is not diaphoretic. No erythema.  Psychiatric: She has a normal mood and affect. Her behavior is normal. Judgment and thought content normal.          Assessment & Plan:  Prosthetic joint infection: Continue Keflex and rifampin for 2 more months minimum (8) now, will  check sedimentation rate and C-reactive protein today and she should followup with Dr. 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 We'll continue her on Keflex and rifampin.

## 2012-08-17 LAB — CBC WITH DIFFERENTIAL/PLATELET
Basophils Absolute: 0 10*3/uL (ref 0.0–0.1)
HCT: 42.2 % (ref 36.0–46.0)
Lymphocytes Relative: 25 % (ref 12–46)
Monocytes Absolute: 0.4 10*3/uL (ref 0.1–1.0)
Neutro Abs: 3.9 10*3/uL (ref 1.7–7.7)
Neutrophils Relative %: 65 % (ref 43–77)
RDW: 14 % (ref 11.5–15.5)
WBC: 5.9 10*3/uL (ref 4.0–10.5)

## 2012-08-17 LAB — COMPLETE METABOLIC PANEL WITH GFR
Albumin: 4.1 g/dL (ref 3.5–5.2)
Alkaline Phosphatase: 107 U/L (ref 39–117)
BUN: 16 mg/dL (ref 6–23)
GFR, Est Non African American: 49 mL/min — ABNORMAL LOW
Glucose, Bld: 76 mg/dL (ref 70–99)
Potassium: 4.2 mEq/L (ref 3.5–5.3)
Total Bilirubin: 0.6 mg/dL (ref 0.3–1.2)

## 2012-09-22 ENCOUNTER — Encounter: Payer: Self-pay | Admitting: Vascular Surgery

## 2012-09-23 ENCOUNTER — Encounter: Payer: Self-pay | Admitting: Vascular Surgery

## 2012-09-23 ENCOUNTER — Ambulatory Visit (INDEPENDENT_AMBULATORY_CARE_PROVIDER_SITE_OTHER): Payer: 59 | Admitting: Vascular Surgery

## 2012-09-23 ENCOUNTER — Encounter (INDEPENDENT_AMBULATORY_CARE_PROVIDER_SITE_OTHER): Payer: Medicare Other | Admitting: *Deleted

## 2012-09-23 VITALS — BP 137/72 | HR 64 | Resp 16 | Ht 64.5 in | Wt 162.0 lb

## 2012-09-23 DIAGNOSIS — I739 Peripheral vascular disease, unspecified: Secondary | ICD-10-CM

## 2012-09-23 DIAGNOSIS — Z48812 Encounter for surgical aftercare following surgery on the circulatory system: Secondary | ICD-10-CM

## 2012-09-23 DIAGNOSIS — S85001A Unspecified injury of popliteal artery, right leg, initial encounter: Secondary | ICD-10-CM

## 2012-09-23 DIAGNOSIS — M791 Myalgia, unspecified site: Secondary | ICD-10-CM | POA: Insufficient documentation

## 2012-09-23 DIAGNOSIS — I724 Aneurysm of artery of lower extremity: Secondary | ICD-10-CM

## 2012-09-23 DIAGNOSIS — S85009A Unspecified injury of popliteal artery, unspecified leg, initial encounter: Secondary | ICD-10-CM

## 2012-09-23 DIAGNOSIS — IMO0001 Reserved for inherently not codable concepts without codable children: Secondary | ICD-10-CM

## 2012-09-23 NOTE — Progress Notes (Signed)
[facility]  Established Arterial Repair  History of Present Illness  [name] is a 79 y.o. (09/08/1925) female who presents with chief complaint: Right knee pain.  Previous operation(s) include: bovine patch angioplasty of iatrogenically injured right popliteal artery (Date: 01/16/10).  Since that procedure her knee has gotten infected and required debridement and further intervention.  The patient's symptoms have progressed due the right subsequent procedures.  The patient's symptoms are: aching in right knee. The patient's treatment regimen currently included: maximal medical management and physical therapy.  Past Medical History, Past Surgical History, Social History, Family History, Medications, Allergies, and Review of Systems are unchanged from previous evaluation on 07/29/10. On ROS: right knee pain and swelling, no rest pain or intermittent claudication  Physical Examination  Filed Vitals:   09/23/12 1533  BP: 137/72  Pulse: 64  Resp: 16  Height: 5' 4.5" (1.638 m)  Weight: 162 lb (73.483 kg)  SpO2: 98%   Body mass index is 27.39 kg/(m^2).  General: A&O x 3, WDWN  Pulmonary: Sym exp, good air movt, CTAB, no rales, rhonchi, & wheezing  Cardiac: RRR, Nl S1, S2, no Murmurs, rubs or gallops  Vascular: Vessel Right Left  Radial Palpable Palpable  Brachial Palpable Palpable  Carotid Palpable, without bruit Palpable, without bruit  Aorta Non-palpable N/A  Femoral Palpable Palpable  Popliteal Non-palpable Non-palpable  PT Palpable Palpable  DP Palpable Palpable   Musculoskeletal: M/S 5/5 throughout , Extremities without ischemic changes , R knee is warm and swollen  Neurologic: Pain and light touch intact in extremities , Motor exam as listed above  Non-Invasive Vascular Imaging ABI (Date: 09/23/2012)  R: 1.20 (1.2), DP: tri, PT: tri, TBI: 1.42  L: 1.32 (1.19), DP: tr, PT: tri, TBI: 1.32  R pop duplex (Date: 09/23/12)  Patent right  popliteal artery with focal intraluminal echo with increased velocity  Medical Decision Making  [name] is a 79 y.o. female who presents with: s/p popliteal artery patch angioplasty for iatrogenic injury .  Based on the patient's vascular studies and examination, I have offered the patient: continued surveillance in 6 months.  I would not make too much out of a single duplex study demonstrating increased velocities due lack of clinical sx and vulnerability of ultrasound to technician factors.  If there is progression in velocity on subsequent studies, an angiogram might be needed to see if there is a true stenosis in the right popliteal artery.  I discussed in depth with the patient the nature of atherosclerosis, and emphasized the importance of maximal medical management including strict control of blood pressure, blood glucose, and lipid levels, obtaining regular exercise, and cessation of smoking.  The patient is aware that without maximal medical management the underlying atherosclerotic disease process will progress, limiting the benefit of any interventions.  I discussed in depth with the patient the need for long term surveillance to improve the primary assisted patency of her repair.  The patient agrees to cooperate with such.  Thank you for allowing [place] to participate in this patient's care.  Leonides Sake, MD [facility] Office: [phone] Pager: [phone]  09/23/2012, 5:03 PM

## 2012-09-26 NOTE — Addendum Note (Signed)
Addended by: Adria Dill L on: 09/26/2012 09:48 AM   Modules accepted: Orders

## 2012-10-20 ENCOUNTER — Ambulatory Visit (INDEPENDENT_AMBULATORY_CARE_PROVIDER_SITE_OTHER): Payer: Medicare Other | Admitting: Infectious Disease

## 2012-10-20 ENCOUNTER — Encounter: Payer: Self-pay | Admitting: Infectious Disease

## 2012-10-20 VITALS — BP 151/83 | HR 59 | Temp 99.0°F | Wt 166.0 lb

## 2012-10-20 DIAGNOSIS — Z96659 Presence of unspecified artificial knee joint: Secondary | ICD-10-CM

## 2012-10-20 DIAGNOSIS — M009 Pyogenic arthritis, unspecified: Secondary | ICD-10-CM

## 2012-10-20 DIAGNOSIS — A499 Bacterial infection, unspecified: Secondary | ICD-10-CM

## 2012-10-20 DIAGNOSIS — B957 Other staphylococcus as the cause of diseases classified elsewhere: Secondary | ICD-10-CM

## 2012-10-20 DIAGNOSIS — T889XXS Complication of surgical and medical care, unspecified, sequela: Secondary | ICD-10-CM

## 2012-10-20 MED ORDER — CEPHALEXIN 500 MG PO CAPS: 1000.0000 mg | ORAL_CAPSULE | Freq: Two times a day (BID) | ORAL | Status: DC

## 2012-10-20 MED ORDER — RIFAMPIN 300 MG PO CAPS: 300.0000 mg | ORAL_CAPSULE | Freq: Two times a day (BID) | ORAL | Status: DC

## 2012-10-20 NOTE — Progress Notes (Signed)
Subjective:    Patient ID: [name], female    DOB: [birth year], 79 y.o.   MRN: [id number]  HPI   79 y.o. female who is sp total knee replacement placed in October 2009 by Dr. Erasmo Leventhal. She has had problems with motion with this at times. She had last seen him in April 2012. At that time she had been using tramadol intermittently for pain at least 2-3 times per day. On 02/28/2012 she was visiting a friend in the hospital and stood up and had pain in the posterior and lateral aspect of the knee. There is no known injury other than getting up from a chair. She notes some swelling in the area. It also feels warm. She has had difficulty weightbearing and has actually increased her tramadol. She has pain even at nighttime when trying to turn over. She denies any neurovascular compromise. Evaluated on 12//06/2011 and an aspiration of the joint revealed very cloudy yellow fluid which was sent for studies as well as blood analysis.  They were abundant WBC in the gram stain, but no organisms were seen on day  The fluid color was yellow, it was turbid. There was 74,373 white cells, 86% were neutrophils. The glucose was <20 . There were no crystals.  Cultures grew staph coag negative staph R to PCN but otherwise susceptible OXacilin, CEF, azithro, cipro, levo, vanco, clinda, rifampin,tetracycline  The C-reactive protein was significantly elevated at 14. The sed rate was 58. Patient was brought to the OR and underwent irrigation,  debridement, and synovectomy, and polyethylene exchange on 03/09/12.   We placed the patient on high dose ancef 2g iv every 8 hours and rifampin 300 mg twice daily. She had been discharged to skilled nursing facility Her knee pain had improved dramatically on antibiotics she is now has pain largely with more vigorous physical activity such as but is getting physical therapy.   Her knee continues to swell daily with activity and swelling can last entire day. She does have pain if  she DOES NOT take her narcotics.       Review of Systems  Constitutional: Negative for fever, chills, diaphoresis, activity change, appetite change, fatigue and unexpected weight change.  HENT: Negative for congestion, sore throat, rhinorrhea, sneezing, trouble swallowing and sinus pressure.   Eyes: Negative for photophobia and visual disturbance.  Respiratory: Negative for cough, chest tightness, shortness of breath, wheezing and stridor.   Cardiovascular: Negative for chest pain, palpitations and leg swelling.  Gastrointestinal: Negative for nausea, vomiting, abdominal pain, diarrhea, constipation, blood in stool, abdominal distention and anal bleeding.  Genitourinary: Negative for dysuria, hematuria, flank pain and difficulty urinating.  Musculoskeletal: Positive for myalgias, joint swelling and arthralgias. Negative for back pain and gait problem.  Skin: Negative for color change, pallor, rash and wound.  Neurological: Negative for dizziness, tremors, weakness and light-headedness.  Hematological: Negative for adenopathy. Does not bruise/bleed easily.  Psychiatric/Behavioral: Negative for behavioral problems, confusion, sleep disturbance, dysphoric mood, decreased concentration and agitation.       Objective:   Physical Exam  Constitutional: She is oriented to person, place, and time. She appears well-developed and well-nourished. No distress.  HENT:  Head: Normocephalic and atraumatic.  Mouth/Throat: Oropharynx is clear and moist. No oropharyngeal exudate.  Eyes: Conjunctivae and EOM are normal. Pupils are equal, round, and reactive to light. No scleral icterus.  Neck: Normal range of motion. Neck supple. No JVD present.  Cardiovascular: Normal rate, regular rhythm and normal heart sounds.  Exam reveals  no gallop and no friction rub.   No murmur heard. Pulmonary/Chest: Effort normal and breath sounds normal. No respiratory distress. She has no wheezes. She has no rales. She  exhibits no tenderness.  Abdominal: She exhibits no distension and no mass. There is no tenderness. There is no rebound and no guarding.  Musculoskeletal: She exhibits no edema and no tenderness.       Right knee: She exhibits decreased range of motion, swelling, effusion and erythema.       Legs: Lymphadenopathy:    She has no cervical adenopathy.  Neurological: She is alert and oriented to person, place, and time. She exhibits normal muscle tone. Coordination normal.  Skin: Skin is warm and dry. She is not diaphoretic. No erythema.  Psychiatric: She has a normal mood and affect. Her behavior is normal. Judgment and thought content normal.          Assessment & Plan:  Prosthetic joint infection: Recheck ESR, CRP which should still be normal. I would like her to see Dr. Cleophas Dunker (appt) upcoming but if HE is happy with her progress and her markers still look good I would be comfortable DC her abx. 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 We'll continue her on Keflex and rifampin.  Coag Neg Staph. Was culprit organism.

## 2012-10-21 LAB — C-REACTIVE PROTEIN: CRP: 0.8 mg/dL — ABNORMAL HIGH (ref <0.60)

## 2012-10-21 LAB — SEDIMENTATION RATE: Sed Rate: 10 mm/hr (ref 0–22)

## 2012-11-02 ENCOUNTER — Other Ambulatory Visit: Payer: Self-pay

## 2012-12-12 ENCOUNTER — Ambulatory Visit (INDEPENDENT_AMBULATORY_CARE_PROVIDER_SITE_OTHER): Payer: Medicare Other | Admitting: Infectious Disease

## 2012-12-12 ENCOUNTER — Encounter: Payer: Self-pay | Admitting: Infectious Disease

## 2012-12-12 VITALS — BP 159/89 | HR 59 | Temp 98.7°F | Wt 165.0 lb

## 2012-12-12 DIAGNOSIS — A499 Bacterial infection, unspecified: Secondary | ICD-10-CM

## 2012-12-12 DIAGNOSIS — M009 Pyogenic arthritis, unspecified: Secondary | ICD-10-CM

## 2012-12-12 DIAGNOSIS — B957 Other staphylococcus as the cause of diseases classified elsewhere: Secondary | ICD-10-CM

## 2012-12-12 NOTE — Progress Notes (Signed)
Subjective:    Patient ID: [name], female    DOB: 01/01/1926, 79 y.o.   MRN: [id number]  HPI   79 y.o. female who is sp total knee replacement placed in October 2009 by Dr. Erasmo Leventhal. She has had problems with motion with this at times. She had last seen him in April 2012. At that time she had been using tramadol intermittently for pain at least 2-3 times per day. On 02/28/2012 she was visiting a friend in the hospital and stood up and had pain in the posterior and lateral aspect of the knee. There is no known injury other than getting up from a chair. She notes some swelling in the area. It also feels warm. She has had difficulty weightbearing and has actually increased her tramadol. She has pain even at nighttime when trying to turn over. She denies any neurovascular compromise. Evaluated on 12//06/2011 and an aspiration of the joint revealed very cloudy yellow fluid which was sent for studies as well as blood analysis.  They were abundant WBC in the gram stain, but no organisms were seen on day  The fluid color was yellow, it was turbid. There was 74,373 white cells, 86% were neutrophils. The glucose was <20 . There were no crystals.  Cultures grew staph coag negative staph R to PCN but otherwise susceptible OXacilin, CEF, azithro, cipro, levo, vanco, clinda, rifampin,tetracycline  The C-reactive protein was significantly elevated at 14. The sed rate was 58. Patient was brought to the OR and underwent irrigation,  debridement, and synovectomy, and polyethylene exchange on 03/09/12.   We placed the patient on high dose ancef 2g iv every 8 hours and rifampin 300 mg twice daily. She had been discharged to skilled nursing facility Her knee pain had improved dramatically on antibiotics she is now has pain largely with more vigorous physical activity such as but is getting physical therapy.   Her knee continued to swell daily with activity and could last all day at times. We had rechecked  inflammatory markers which were re--assuring but had kept her on oral abx until pt could be seen by Dr. Cleophas Dunker. Per the pt Dr. Cleophas Dunker would himself be comfortable with pt being off abx.     Review of Systems  Constitutional: Negative for fever, chills, diaphoresis, activity change, appetite change, fatigue and unexpected weight change.  HENT: Negative for congestion, sore throat, rhinorrhea, sneezing, trouble swallowing and sinus pressure.   Eyes: Negative for photophobia and visual disturbance.  Respiratory: Negative for cough, chest tightness, shortness of breath, wheezing and stridor.   Cardiovascular: Negative for chest pain, palpitations and leg swelling.  Gastrointestinal: Negative for nausea, vomiting, abdominal pain, diarrhea, constipation, blood in stool, abdominal distention and anal bleeding.  Genitourinary: Negative for dysuria, hematuria, flank pain and difficulty urinating.  Musculoskeletal: Positive for myalgias, joint swelling and arthralgias. Negative for back pain and gait problem.  Skin: Negative for color change, pallor, rash and wound.  Neurological: Negative for dizziness, tremors, weakness and light-headedness.  Hematological: Negative for adenopathy. Does not bruise/bleed easily.  Psychiatric/Behavioral: Negative for behavioral problems, confusion, sleep disturbance, dysphoric mood, decreased concentration and agitation.       Objective:   Physical Exam  Constitutional: She is oriented to person, place, and time. She appears well-developed and well-nourished. No distress.  HENT:  Head: Normocephalic and atraumatic.  Mouth/Throat: Oropharynx is clear and moist. No oropharyngeal exudate.  Eyes: Conjunctivae and EOM are normal. Pupils are equal, round, and reactive to light. No scleral  icterus.  Neck: Normal range of motion. Neck supple. No JVD present.  Cardiovascular: Normal rate, regular rhythm and normal heart sounds.  Exam reveals no gallop and no friction  rub.   No murmur heard. Pulmonary/Chest: Effort normal and breath sounds normal. No respiratory distress. She has no wheezes. She has no rales. She exhibits no tenderness.  Abdominal: She exhibits no distension and no mass. There is no tenderness. There is no rebound and no guarding.  Musculoskeletal: She exhibits no edema and no tenderness.       Right knee: She exhibits decreased range of motion, swelling, effusion and erythema.       Legs: Lymphadenopathy:    She has no cervical adenopathy.  Neurological: She is alert and oriented to person, place, and time. She exhibits normal muscle tone. Coordination normal.  Skin: Skin is warm and dry. She is not diaphoretic. No erythema.  Psychiatric: She has a normal mood and affect. Her behavior is normal. Judgment and thought content normal.          Assessment & Plan:  Prosthetic joint infection: Recheck ESR, CRP and if these are normal will stop her antibiotics and bring her back in 2 months time.   Coag Neg Staph. Was culprit organism.

## 2012-12-13 LAB — BASIC METABOLIC PANEL WITH GFR
BUN: 15 mg/dL (ref 6–23)
Calcium: 8.9 mg/dL (ref 8.4–10.5)
GFR, Est African American: 56 mL/min — ABNORMAL LOW
GFR, Est Non African American: 49 mL/min — ABNORMAL LOW
Glucose, Bld: 76 mg/dL (ref 70–99)
Potassium: 4.1 mEq/L (ref 3.5–5.3)
Sodium: 137 mEq/L (ref 135–145)

## 2012-12-13 LAB — C-REACTIVE PROTEIN: CRP: <0.5 mg/dL (ref <0.60)

## 2012-12-14 ENCOUNTER — Other Ambulatory Visit: Payer: Self-pay | Admitting: Infectious Disease

## 2012-12-21 ENCOUNTER — Ambulatory Visit: Payer: Medicare Other | Admitting: Infectious Disease

## 2013-02-02 ENCOUNTER — Other Ambulatory Visit: Payer: Self-pay

## 2013-02-08 ENCOUNTER — Encounter: Payer: Self-pay | Admitting: Infectious Disease

## 2013-02-08 ENCOUNTER — Ambulatory Visit: Payer: Medicare Other | Admitting: Infectious Disease

## 2013-02-08 ENCOUNTER — Ambulatory Visit (INDEPENDENT_AMBULATORY_CARE_PROVIDER_SITE_OTHER): Payer: Medicare Other | Admitting: Infectious Disease

## 2013-02-08 VITALS — BP 150/80 | HR 59 | Temp 98.2°F | Ht 64.5 in | Wt 165.5 lb

## 2013-02-08 DIAGNOSIS — Z23 Encounter for immunization: Secondary | ICD-10-CM

## 2013-02-08 DIAGNOSIS — A499 Bacterial infection, unspecified: Secondary | ICD-10-CM

## 2013-02-08 DIAGNOSIS — B957 Other staphylococcus as the cause of diseases classified elsewhere: Secondary | ICD-10-CM

## 2013-02-08 DIAGNOSIS — M009 Pyogenic arthritis, unspecified: Secondary | ICD-10-CM

## 2013-02-08 LAB — SEDIMENTATION RATE: Sed Rate: 8 mm/hr (ref 0–22)

## 2013-02-08 NOTE — Progress Notes (Signed)
Subjective:    Patient ID: [name], female    DOB: [birth year], 79 y.o.   MRN: [id number]  HPI   79 y.o. female who is sp total knee replacement placed in October 2009 by Dr. Erasmo Leventhal. She has had problems with motion with this at times. She had last seen him in April 2012. At that time she had been using tramadol intermittently for pain at least 2-3 times per day. On 02/28/2012 she was visiting a friend in the hospital and stood up and had pain in the posterior and lateral aspect of the knee. There is no known injury other than getting up from a chair. She notes some swelling in the area. It also feels warm. She has had difficulty weightbearing and has actually increased her tramadol. She has pain even at nighttime when trying to turn over. She denies any neurovascular compromise. Evaluated on 12//06/2011 and an aspiration of the joint revealed very cloudy yellow fluid which was sent for studies as well as blood analysis.  They were abundant WBC in the gram stain, but no organisms were seen on day  The fluid color was yellow, it was turbid. There was 74,373 white cells, 86% were neutrophils. The glucose was <20 . There were no crystals.  Cultures grew staph coag negative staph R to PCN but otherwise susceptible OXacilin, CEF, azithro, cipro, levo, vanco, clinda, rifampin,tetracycline  The C-reactive protein was significantly elevated at 14. The sed rate was 58. Patient was brought to the OR and underwent irrigation,  debridement, and synovectomy, and polyethylene exchange on 03/09/12.   We placed the patient on high dose ancef 2g iv every 8 hours and rifampin 300 mg twice daily. She had been discharged to skilled nursing facility Her knee pain had improved dramatically on antibiotics   Her knee continued to swell daily with activity and could last all day at times. We had rechecked inflammatory markers which were re--assuring but had kept her on oral abx until pt could be seen by Dr.  Cleophas Dunker. And have had her off abx since last visit with [place]. She states that Dr. Cleophas Dunker has aspirated the knee but unable to obtain any fluid. She still takes ultram bid and has pain when she wakes up and when not taking the ultram. No fevers, chills or nauasea or malaise.     Review of Systems  Constitutional: Negative for fever, chills, diaphoresis, activity change, appetite change, fatigue and unexpected weight change.  HENT: Negative for congestion, rhinorrhea, sinus pressure, sneezing, sore throat and trouble swallowing.   Eyes: Negative for photophobia and visual disturbance.  Respiratory: Negative for cough, chest tightness, shortness of breath, wheezing and stridor.   Cardiovascular: Negative for chest pain, palpitations and leg swelling.  Gastrointestinal: Negative for nausea, vomiting, abdominal pain, diarrhea, constipation, blood in stool, abdominal distention and anal bleeding.  Genitourinary: Negative for dysuria, hematuria, flank pain and difficulty urinating.  Musculoskeletal: Positive for arthralgias, joint swelling and myalgias. Negative for back pain and gait problem.  Skin: Negative for color change, pallor, rash and wound.  Neurological: Negative for dizziness, tremors, weakness and light-headedness.  Hematological: Negative for adenopathy. Does not bruise/bleed easily.  Psychiatric/Behavioral: Negative for behavioral problems, confusion, sleep disturbance, dysphoric mood, decreased concentration and agitation.       Objective:   Physical Exam  Constitutional: She is oriented to person, place, and time. She appears well-developed and well-nourished. No distress.  HENT:  Head: Normocephalic and atraumatic.  Mouth/Throat: Oropharynx is clear and moist. No  oropharyngeal exudate.  Eyes: Conjunctivae and EOM are normal. Pupils are equal, round, and reactive to light. No scleral icterus.  Neck: Normal range of motion. Neck supple. No JVD present.  Cardiovascular: Normal  rate, regular rhythm and normal heart sounds.  Exam reveals no gallop and no friction rub.   No murmur heard. Pulmonary/Chest: Effort normal and breath sounds normal. No respiratory distress. She has no wheezes. She has no rales. She exhibits no tenderness.  Abdominal: She exhibits no distension and no mass. There is no tenderness. There is no rebound and no guarding.  Musculoskeletal: She exhibits no edema and no tenderness.       Right knee: She exhibits decreased range of motion, swelling and effusion. She exhibits no erythema.       Legs: Lymphadenopathy:    She has no cervical adenopathy.  Neurological: She is alert and oriented to person, place, and time. She exhibits normal muscle tone. Coordination normal.  Skin: Skin is warm and dry. She is not diaphoretic. No erythema.  Psychiatric: She has a normal mood and affect. Her behavior is normal. Judgment and thought content normal.          Assessment & Plan:  Prosthetic joint infection: Recheck ESR, CRP  And continue to observe off abx  Coag Neg Staph. Was culprit organism.  Flu shot; make sure she has had flu vax

## 2013-03-02 ENCOUNTER — Telehealth: Payer: Self-pay | Admitting: *Deleted

## 2013-03-02 NOTE — Telephone Encounter (Signed)
Shouldn't the eye surgeons have their own protocol for antibiotic prophylaxis? I am not aware of whether there is exception for eye surgeries?

## 2013-03-02 NOTE — Telephone Encounter (Signed)
Patient's daughter calling on her behalf, asking if she would need any prophylactic antibiotics prior to upcoming laser eye surgery.  We can call her mother at home or on her cell with the answer. Andree Coss, RN

## 2013-03-06 ENCOUNTER — Ambulatory Visit: Payer: Medicare Other | Admitting: Infectious Disease

## 2013-03-15 ENCOUNTER — Encounter (HOSPITAL_COMMUNITY)
Admission: AD | Disposition: A | Discharge status: Skilled Nursing Facility | Payer: Self-pay | Source: Ambulatory Visit | Attending: Orthopaedic Surgery

## 2013-03-15 ENCOUNTER — Encounter (HOSPITAL_COMMUNITY): Payer: Medicare Other | Admitting: Certified Registered"

## 2013-03-15 ENCOUNTER — Encounter (HOSPITAL_COMMUNITY): Payer: Self-pay | Admitting: Certified Registered"

## 2013-03-15 ENCOUNTER — Inpatient Hospital Stay (HOSPITAL_COMMUNITY): Payer: Medicare Other

## 2013-03-15 ENCOUNTER — Inpatient Hospital Stay (HOSPITAL_COMMUNITY)
Admission: AD | Admit: 2013-03-15 | Discharge: 2013-03-20 | DRG: 464 | Disposition: A | Discharge status: Skilled Nursing Facility | Payer: Medicare Other | Source: Ambulatory Visit | Attending: Orthopaedic Surgery | Admitting: Orthopaedic Surgery

## 2013-03-15 ENCOUNTER — Inpatient Hospital Stay (HOSPITAL_COMMUNITY): Payer: Medicare Other | Admitting: Certified Registered"

## 2013-03-15 DIAGNOSIS — D649 Anemia, unspecified: Secondary | ICD-10-CM

## 2013-03-15 DIAGNOSIS — B957 Other staphylococcus as the cause of diseases classified elsewhere: Secondary | ICD-10-CM

## 2013-03-15 DIAGNOSIS — I10 Essential (primary) hypertension: Secondary | ICD-10-CM | POA: Diagnosis not present

## 2013-03-15 DIAGNOSIS — Z7982 Long term (current) use of aspirin: Secondary | ICD-10-CM

## 2013-03-15 DIAGNOSIS — D509 Iron deficiency anemia, unspecified: Secondary | ICD-10-CM | POA: Diagnosis not present

## 2013-03-15 DIAGNOSIS — T8450XA Infection and inflammatory reaction due to unspecified internal joint prosthesis, initial encounter: Secondary | ICD-10-CM

## 2013-03-15 DIAGNOSIS — T8450XD Infection and inflammatory reaction due to unspecified internal joint prosthesis, subsequent encounter: Secondary | ICD-10-CM

## 2013-03-15 DIAGNOSIS — E039 Hypothyroidism, unspecified: Secondary | ICD-10-CM | POA: Diagnosis present

## 2013-03-15 DIAGNOSIS — D62 Acute posthemorrhagic anemia: Secondary | ICD-10-CM | POA: Diagnosis not present

## 2013-03-15 DIAGNOSIS — K219 Gastro-esophageal reflux disease without esophagitis: Secondary | ICD-10-CM | POA: Diagnosis present

## 2013-03-15 DIAGNOSIS — T8459XA Infection and inflammatory reaction due to other internal joint prosthesis, initial encounter: Secondary | ICD-10-CM

## 2013-03-15 DIAGNOSIS — T8459XD Infection and inflammatory reaction due to other internal joint prosthesis, subsequent encounter: Secondary | ICD-10-CM

## 2013-03-15 DIAGNOSIS — Y831 Surgical operation with implant of artificial internal device as the cause of abnormal reaction of the patient, or of later complication, without mention of misadventure at the time of the procedure: Secondary | ICD-10-CM | POA: Diagnosis present

## 2013-03-15 DIAGNOSIS — M009 Pyogenic arthritis, unspecified: Secondary | ICD-10-CM

## 2013-03-15 DIAGNOSIS — I739 Peripheral vascular disease, unspecified: Secondary | ICD-10-CM | POA: Diagnosis present

## 2013-03-15 DIAGNOSIS — R339 Retention of urine, unspecified: Secondary | ICD-10-CM | POA: Clinically undetermined

## 2013-03-15 DIAGNOSIS — Z8249 Family history of ischemic heart disease and other diseases of the circulatory system: Secondary | ICD-10-CM

## 2013-03-15 HISTORY — DX: Unspecified chronic bronchitis: J42

## 2013-03-15 HISTORY — PX: I & D EXTREMITY: SHX5045

## 2013-03-15 HISTORY — PX: TOTAL KNEE  PROSTHESIS REMOVAL W/ SPACER INSERTION: SHX2541

## 2013-03-15 HISTORY — DX: Pure hypercholesterolemia, unspecified: E78.00

## 2013-03-15 HISTORY — DX: Gastro-esophageal reflux disease without esophagitis: K21.9

## 2013-03-15 HISTORY — DX: Malignant neoplasm of unspecified site of unspecified female breast: C50.919

## 2013-03-15 HISTORY — PX: JOINT REPLACEMENT: SHX530

## 2013-03-15 HISTORY — DX: Malignant neoplasm of uterus, part unspecified: C55

## 2013-03-15 LAB — MRSA PCR SCREENING: MRSA by PCR: NEGATIVE

## 2013-03-15 LAB — COMPREHENSIVE METABOLIC PANEL
ALT: 11 U/L (ref 0–35)
AST: 16 U/L (ref 0–37)
Alkaline Phosphatase: 104 U/L (ref 39–117)
CO2: 26 mEq/L (ref 19–32)
GFR calc Af Amer: 56 mL/min — ABNORMAL LOW (ref >90)
GFR calc non Af Amer: 48 mL/min — ABNORMAL LOW (ref >90)
Glucose, Bld: 101 mg/dL — ABNORMAL HIGH (ref 70–99)
Potassium: 3.8 mEq/L (ref 3.5–5.1)
Sodium: 136 mEq/L (ref 135–145)
Total Bilirubin: 0.5 mg/dL (ref 0.3–1.2)

## 2013-03-15 LAB — URINALYSIS, ROUTINE W REFLEX MICROSCOPIC
Bilirubin Urine: NEGATIVE
Glucose, UA: NEGATIVE mg/dL
Ketones, ur: NEGATIVE mg/dL
Specific Gravity, Urine: 1.015 (ref 1.005–1.030)
pH: 6 (ref 5.0–8.0)

## 2013-03-15 LAB — TYPE AND SCREEN
ABO/RH(D): A POS
Antibody Screen: NEGATIVE

## 2013-03-15 LAB — URINE MICROSCOPIC-ADD ON

## 2013-03-15 LAB — CBC WITH DIFFERENTIAL/PLATELET
Basophils Relative: 0 % (ref 0–1)
Eosinophils Absolute: 0 10*3/uL (ref 0.0–0.7)
HCT: 40.9 % (ref 36.0–46.0)
Hemoglobin: 14.5 g/dL (ref 12.0–15.0)
Lymphs Abs: 1.4 10*3/uL (ref 0.7–4.0)
MCH: 31.5 pg (ref 26.0–34.0)
MCHC: 35.5 g/dL (ref 30.0–36.0)
MCV: 88.7 fL (ref 78.0–100.0)
Monocytes Absolute: 0.5 10*3/uL (ref 0.1–1.0)
Monocytes Relative: 5 % (ref 3–12)
Neutro Abs: 8.4 10*3/uL — ABNORMAL HIGH (ref 1.7–7.7)
Neutrophils Relative %: 81 % — ABNORMAL HIGH (ref 43–77)
RDW: 13.3 % (ref 11.5–15.5)
WBC: 10.4 10*3/uL (ref 4.0–10.5)

## 2013-03-15 SURGERY — IRRIGATION AND DEBRIDEMENT EXTREMITY
Anesthesia: General | Site: Knee | Laterality: Right

## 2013-03-15 MED ORDER — BUPIVACAINE-EPINEPHRINE 0.25% -1:200000 IJ SOLN
INTRAMUSCULAR | Status: DC | PRN
  Administered 2013-03-15: 30 mL

## 2013-03-15 MED ORDER — GLYCOPYRROLATE 0.2 MG/ML IJ SOLN
INTRAMUSCULAR | Status: DC | PRN
  Administered 2013-03-15: .7 mg via INTRAVENOUS

## 2013-03-15 MED ORDER — CEFAZOLIN SODIUM-DEXTROSE 2-3 GM-% IV SOLR
INTRAVENOUS | Status: AC
  Filled 2013-03-15: qty 50

## 2013-03-15 MED ORDER — ACETAMINOPHEN 10 MG/ML IV SOLN
INTRAVENOUS | Status: AC
  Filled 2013-03-15: qty 100

## 2013-03-15 MED ORDER — PROPOFOL 10 MG/ML IV BOLUS
INTRAVENOUS | Status: DC | PRN
  Administered 2013-03-15: 140 mg via INTRAVENOUS

## 2013-03-15 MED ORDER — HYDROMORPHONE HCL PF 1 MG/ML IJ SOLN: 0.2500 mg | INTRAMUSCULAR | Status: DC | PRN

## 2013-03-15 MED ORDER — CHLORHEXIDINE GLUCONATE 4 % EX LIQD
60.0000 mL | Freq: Once | CUTANEOUS | Status: DC
  Filled 2013-03-15: qty 60

## 2013-03-15 MED ORDER — ROCURONIUM BROMIDE 100 MG/10ML IV SOLN
INTRAVENOUS | Status: DC | PRN
  Administered 2013-03-15: 10 mg via INTRAVENOUS
  Administered 2013-03-15: 30 mg via INTRAVENOUS
  Administered 2013-03-15 (×3): 10 mg via INTRAVENOUS

## 2013-03-15 MED ORDER — ARTIFICIAL TEARS OP OINT
TOPICAL_OINTMENT | OPHTHALMIC | Status: DC | PRN
  Administered 2013-03-15: 1 via OPHTHALMIC

## 2013-03-15 MED ORDER — OXYCODONE HCL 5 MG/5ML PO SOLN: 5.0000 mg | Freq: Once | ORAL | Status: AC | PRN

## 2013-03-15 MED ORDER — CEFAZOLIN (ANCEF) 1 G IV SOLR
2.0000 g | INTRAVENOUS | Status: AC
  Administered 2013-03-15: 2 g
  Filled 2013-03-15: qty 2

## 2013-03-15 MED ORDER — DEXTROSE 5 % IV SOLN
INTRAVENOUS | Status: DC | PRN
  Administered 2013-03-15: 21:00:00 via INTRAVENOUS

## 2013-03-15 MED ORDER — NEOSTIGMINE METHYLSULFATE 1 MG/ML IJ SOLN
INTRAMUSCULAR | Status: DC | PRN
  Administered 2013-03-15: 4 mg via INTRAVENOUS

## 2013-03-15 MED ORDER — SUCCINYLCHOLINE CHLORIDE 20 MG/ML IJ SOLN
INTRAMUSCULAR | Status: DC | PRN
  Administered 2013-03-15: 90 mg via INTRAVENOUS

## 2013-03-15 MED ORDER — MINERAL OIL LIGHT 100 % EX OIL
TOPICAL_OIL | CUTANEOUS | Status: AC
  Filled 2013-03-15: qty 25

## 2013-03-15 MED ORDER — SODIUM CHLORIDE 0.9 % IR SOLN
Status: DC | PRN
  Administered 2013-03-15: 1000 mL

## 2013-03-15 MED ORDER — LIDOCAINE HCL (CARDIAC) 20 MG/ML IV SOLN
INTRAVENOUS | Status: DC | PRN
  Administered 2013-03-15: 50 mg via INTRAVENOUS

## 2013-03-15 MED ORDER — CEFAZOLIN SODIUM-DEXTROSE 2-3 GM-% IV SOLR
INTRAVENOUS | Status: DC | PRN
  Administered 2013-03-15: 2 g via INTRAVENOUS

## 2013-03-15 MED ORDER — PROMETHAZINE HCL 25 MG/ML IJ SOLN: 6.2500 mg | INTRAMUSCULAR | Status: DC | PRN

## 2013-03-15 MED ORDER — FENTANYL CITRATE 0.05 MG/ML IJ SOLN
INTRAMUSCULAR | Status: DC | PRN
  Administered 2013-03-15 (×7): 50 ug via INTRAVENOUS

## 2013-03-15 MED ORDER — KETOROLAC TROMETHAMINE 15 MG/ML IJ SOLN
7.5000 mg | Freq: Four times a day (QID) | INTRAMUSCULAR | Status: AC
  Administered 2013-03-16 (×2): 7.5 mg via INTRAVENOUS
  Filled 2013-03-15 (×2): qty 1

## 2013-03-15 MED ORDER — BUPIVACAINE-EPINEPHRINE (PF) 0.25% -1:200000 IJ SOLN
INTRAMUSCULAR | Status: AC
  Filled 2013-03-15: qty 30

## 2013-03-15 MED ORDER — ONDANSETRON HCL 4 MG/2ML IJ SOLN
INTRAMUSCULAR | Status: DC | PRN
  Administered 2013-03-15: 4 mg via INTRAVENOUS

## 2013-03-15 MED ORDER — LACTATED RINGERS IV SOLN
INTRAVENOUS | Status: DC | PRN
  Administered 2013-03-15 (×2): via INTRAVENOUS

## 2013-03-15 MED ORDER — SODIUM CHLORIDE 0.9 % IV SOLN: INTRAVENOUS | Status: DC

## 2013-03-15 MED ORDER — ACETAMINOPHEN 10 MG/ML IV SOLN
INTRAVENOUS | Status: DC | PRN
  Administered 2013-03-15: 1000 mg via INTRAVENOUS

## 2013-03-15 MED ORDER — OXYCODONE HCL 5 MG PO TABS
5.0000 mg | ORAL_TABLET | Freq: Once | ORAL | Status: AC | PRN
Start: 2013-03-15 — End: 2013-03-15

## 2013-03-15 SURGICAL SUPPLY — 71 items
BAG DECANTER FOR FLEXI CONT (MISCELLANEOUS) ×2 IMPLANT
BANDAGE ELASTIC 4 VELCRO ST LF (GAUZE/BANDAGES/DRESSINGS) ×2 IMPLANT
BANDAGE ELASTIC 6 VELCRO ST LF (GAUZE/BANDAGES/DRESSINGS) ×1 IMPLANT
BANDAGE GAUZE ELAST BULKY 4 IN (GAUZE/BANDAGES/DRESSINGS) ×3 IMPLANT
BLADE SAW SGTL 81X20 HD (BLADE) ×2 IMPLANT
BNDG COHESIVE 4X5 TAN STRL (GAUZE/BANDAGES/DRESSINGS) ×2 IMPLANT
BNDG GAUZE ELAST 4 BULKY (GAUZE/BANDAGES/DRESSINGS) ×1 IMPLANT
BONE CEMENT GENTAMICIN (Cement) ×8 IMPLANT
BOWL SMART MIX CTS (DISPOSABLE) ×2 IMPLANT
CEMENT BONE GENTAMICIN 40 (Cement) IMPLANT
CLOTH BEACON ORANGE TIMEOUT ST (SAFETY) ×2 IMPLANT
CONT SPEC 4OZ CLIKSEAL STRL BL (MISCELLANEOUS) ×3 IMPLANT
CUFF TOURNIQUET SINGLE 34IN LL (TOURNIQUET CUFF) ×1 IMPLANT
CUFF TOURNIQUET SINGLE 44IN (TOURNIQUET CUFF) IMPLANT
DRAIN SNY WOU MED RD (WOUND CARE) IMPLANT
DRAPE EXTREMITY T 121X128X90 (DRAPE) ×1 IMPLANT
DRESSING ALLEVYN LIFE SACRUM (GAUZE/BANDAGES/DRESSINGS) ×1 IMPLANT
DRSG ADAPTIC 3X8 NADH LF (GAUZE/BANDAGES/DRESSINGS) ×2 IMPLANT
DRSG EMULSION OIL 3X3 NADH (GAUZE/BANDAGES/DRESSINGS) ×1 IMPLANT
DRSG PAD ABDOMINAL 8X10 ST (GAUZE/BANDAGES/DRESSINGS) ×5 IMPLANT
DURAPREP 26ML APPLICATOR (WOUND CARE) ×2 IMPLANT
ELECT REM PT RETURN 9FT ADLT (ELECTROSURGICAL)
ELECTRODE REM PT RTRN 9FT ADLT (ELECTROSURGICAL) IMPLANT
FACESHIELD LNG OPTICON STERILE (SAFETY) ×3 IMPLANT
GLOVE BIOGEL PI IND STRL 6.5 (GLOVE) IMPLANT
GLOVE BIOGEL PI IND STRL 8 (GLOVE) ×1 IMPLANT
GLOVE BIOGEL PI INDICATOR 6.5 (GLOVE) ×1
GLOVE BIOGEL PI INDICATOR 8 (GLOVE) ×5
GLOVE BIOGEL PI ORTHO PRO SZ8 (GLOVE) ×2
GLOVE ECLIPSE 8.0 STRL XLNG CF (GLOVE) ×2 IMPLANT
GLOVE ECLIPSE 8.5 STRL (GLOVE) ×2 IMPLANT
GLOVE PI ORTHO PRO STRL SZ8 (GLOVE) IMPLANT
GLOVE SKINSENSE NS SZ6.5 (GLOVE) ×1
GLOVE SKINSENSE STRL SZ6.0 (GLOVE) ×1 IMPLANT
GLOVE SKINSENSE STRL SZ6.5 (GLOVE) IMPLANT
GOWN STRL NON-REIN LRG LVL3 (GOWN DISPOSABLE) ×4 IMPLANT
HANDPIECE INTERPULSE COAX TIP (DISPOSABLE) ×2
IMMOBILIZER KNEE 20 (SOFTGOODS) ×2
IMMOBILIZER KNEE 20 THIGH 36 (SOFTGOODS) IMPLANT
INSERT TIB LCS RP STD+ 10 (Knees) ×1 IMPLANT
KIT BASIN OR (CUSTOM PROCEDURE TRAY) ×2 IMPLANT
KIT ROOM TURNOVER OR (KITS) ×2 IMPLANT
MANIFOLD NEPTUNE II (INSTRUMENTS) ×2 IMPLANT
NDL SAFETY ECLIPSE 18X1.5 (NEEDLE) IMPLANT
NEEDLE 22X1 1/2 (OR ONLY) (NEEDLE) ×1 IMPLANT
NEEDLE HYPO 18GX1.5 SHARP (NEEDLE) ×2
NS IRRIG 1000ML POUR BTL (IV SOLUTION) ×2 IMPLANT
PACK TOTAL JOINT (CUSTOM PROCEDURE TRAY) ×1 IMPLANT
PAD ABD 8X10 STRL (GAUZE/BANDAGES/DRESSINGS) ×1 IMPLANT
PAD ARMBOARD 7.5X6 YLW CONV (MISCELLANEOUS) ×4 IMPLANT
PENCIL BUTTON HOLSTER BLD 10FT (ELECTRODE) ×2 IMPLANT
SET HNDPC FAN SPRY TIP SCT (DISPOSABLE) IMPLANT
SPONGE GAUZE 4X4 12PLY (GAUZE/BANDAGES/DRESSINGS) ×3 IMPLANT
SPONGE GAUZE 4X4 12PLY STER LF (GAUZE/BANDAGES/DRESSINGS) ×1 IMPLANT
SPONGE LAP 18X18 X RAY DECT (DISPOSABLE) ×2 IMPLANT
SPONGE LAP 4X18 X RAY DECT (DISPOSABLE) ×1 IMPLANT
STOCKINETTE IMPERVIOUS 9X36 MD (GAUZE/BANDAGES/DRESSINGS) ×1 IMPLANT
SUT BONE WAX W31G (SUTURE) ×1 IMPLANT
SUT ETHILON 3 0 PS 1 (SUTURE) IMPLANT
SUT PDS AB 1 CT  36 (SUTURE) ×3
SUT PDS AB 1 CT 36 (SUTURE) IMPLANT
SUT PDS AB 2-0 CT1 27 (SUTURE) ×3 IMPLANT
SYR CONTROL 10ML LL (SYRINGE) ×1 IMPLANT
TOWEL OR 17X24 6PK STRL BLUE (TOWEL DISPOSABLE) ×2 IMPLANT
TOWEL OR 17X26 10 PK STRL BLUE (TOWEL DISPOSABLE) ×2 IMPLANT
TRAY FOLEY CATH 16FRSI W/METER (SET/KITS/TRAYS/PACK) ×1 IMPLANT
TUBE ANAEROBIC SPECIMEN COL (MISCELLANEOUS) ×1 IMPLANT
TUBE CONNECTING 12X1/4 (SUCTIONS) ×1 IMPLANT
UNDERPAD 30X30 INCONTINENT (UNDERPADS AND DIAPERS) ×2 IMPLANT
WATER STERILE IRR 1000ML POUR (IV SOLUTION) ×2 IMPLANT
YANKAUER SUCT BULB TIP NO VENT (SUCTIONS) ×1 IMPLANT

## 2013-03-15 NOTE — Op Note (Signed)
PATIENT ID:      [name]  MRN:     [id number] DOB/AGE:    April 16, 1925 / 79 y.o.       OPERATIVE REPORT    DATE OF PROCEDURE:  03/15/2013       PREOPERATIVE DIAGNOSIS:   Infected Right Knee Prosthesis                                                       Estimated body mass index is 27.98 kg/(m^2) as calculated from the following:   Height as of 02/08/13: 5' 4.5" (1.638 m).   Weight as of 02/08/13: 75.07 kg (165 lb 8 oz).     POSTOPERATIVE DIAGNOSIS:   Infected Right Knee Prosthesis                                                                     Estimated body mass index is 27.98 kg/(m^2) as calculated from the following:   Height as of 02/08/13: 5' 4.5" (1.638 m).   Weight as of 02/08/13: 75.07 kg (165 lb 8 oz).     PROCEDURE:  Procedure(s): Exploration right total knee replacement , removal of components, and insertion of antibiotic spacer. right     SURGEON:  [name], MD    ASSISTANT:   Jacqualine Code, PA-C   (Present and scrubbed throughout the case, critical for assistance with exposure, retraction, instrumentation, and closure.)          ANESTHESIA: general     DRAINS: (right knee) Hemovact drain(s) in the clamped with  Suction Clamped :      TOURNIQUET TIME:  Total Tourniquet Time Documented: Calf (Right) - 120 minutes Total: Calf (Right) - 120 minutes     COMPLICATIONS:  None   CONDITION:  stable  PROCEDURE IN [id number]   [name] W 03/15/2013, 10:50 PM

## 2013-03-15 NOTE — Transfer of Care (Signed)
Immediate Anesthesia Transfer of Care Note  Patient: [name]  Procedure(s) Performed: Procedure(s): Exploration right total knee replacement , removal of components, and insertion of antibiotic spacer. (Right)  Patient Location: PACU  Anesthesia Type:General  Level of Consciousness: awake, alert , oriented and patient cooperative  Airway & Oxygen Therapy: Patient Spontanous Breathing and Patient connected to face mask oxygen  Post-op Assessment: Report given to PACU RN and Post -op Vital signs reviewed and stable  Post vital signs: Reviewed and stable  Complications: No apparent anesthesia complications

## 2013-03-15 NOTE — Anesthesia Preprocedure Evaluation (Addendum)
Anesthesia Evaluation  Patient identified by MRN, date of birth, ID band Patient awake    Reviewed: Allergy & Precautions, H&P , NPO status , Patient's Chart, lab work & pertinent test results  History of Anesthesia Complications (+) PONV  Airway Mallampati: II  Neck ROM: Full    Dental  (+) Edentulous Upper   Pulmonary  breath sounds clear to auscultation        Cardiovascular hypertension, Pt. on medications + Peripheral Vascular Disease Rhythm:Regular Rate:Normal     Neuro/Psych    GI/Hepatic Neg liver ROS, hiatal hernia,   Endo/Other  negative endocrine ROSHypothyroidism   Renal/GU      Musculoskeletal   Abdominal   Peds  Hematology negative hematology ROS (+)   Anesthesia Other Findings   Reproductive/Obstetrics                         Anesthesia Physical Anesthesia Plan  ASA: II and emergent  Anesthesia Plan: General   Post-op Pain Management:    Induction: Intravenous  Airway Management Planned: Oral ETT  Additional Equipment:   Intra-op Plan:   Post-operative Plan: Extubation in OR  Informed Consent: I have reviewed the patients History and Physical, chart, labs and discussed the procedure including the risks, benefits and alternatives for the proposed anesthesia with the patient or authorized representative who has indicated his/her understanding and acceptance.   Dental advisory given  Plan Discussed with: CRNA and Surgeon  Anesthesia Plan Comments:        Anesthesia Quick Evaluation

## 2013-03-15 NOTE — Progress Notes (Signed)
Pt went to OR from xray EKG done MRSA swab done UA sent.

## 2013-03-15 NOTE — Anesthesia Procedure Notes (Signed)
Procedure Name: Intubation Date/Time: 03/15/2013 8:05 PM Performed by: Julianne Rice Z Pre-anesthesia Checklist: Patient identified, Timeout performed, Emergency Drugs available, Suction available and Patient being monitored Patient Re-evaluated:Patient Re-evaluated prior to inductionOxygen Delivery Method: Circle system utilized Preoxygenation: Pre-oxygenation with 100% oxygen Intubation Type: IV induction and Cricoid Pressure applied Ventilation: Mask ventilation without difficulty Laryngoscope Size: Mac and 3 Grade View: Grade I Tube type: Oral Tube size: 7.5 mm Number of attempts: 1 Airway Equipment and Method: Stylet Placement Confirmation: ETT inserted through vocal cords under direct vision and breath sounds checked- equal and bilateral Secured at: 22 cm Tube secured with: Tape Dental Injury: Teeth and Oropharynx as per pre-operative assessment

## 2013-03-15 NOTE — Anesthesia Postprocedure Evaluation (Signed)
  Anesthesia Post-op Note  Patient: [name]  Procedure(s) Performed: Procedure(s): Exploration right total knee replacement , removal of components, and insertion of antibiotic spacer. (Right)  Patient Location: PACU  Anesthesia Type:General  Level of Consciousness: awake and alert   Airway and Oxygen Therapy: Patient Spontanous Breathing  Post-op Pain: mild  Post-op Assessment: Post-op Vital signs reviewed  Post-op Vital Signs: stable  Complications: No apparent anesthesia complications

## 2013-03-15 NOTE — H&P (Signed)
Megan Campbell, MD   Jacqualine Code, PA-C [address], [place], [place]  [address]                             [phone]   ORTHOPAEDIC HISTORY & PHYSICAL  [name] MRN:  [id number] DOB/SEX:  November 04, 1925/female  CHIEF COMPLAINT:  Painful right total knee  HISTORY:  [name] is a very pleasant 79 year old white female who was noted to have a pain in her right knee.  She had total knee replacement back in October of 2009 by Dr. Erasmo Leventhal.  She had some problems with motion with this at that time.  She did have a manipulation for arthrofibrosis on 05/28/2010.  At the time of her surgery, she did have a popliteal artery injury and was evaluated by Dr. Imogene Burn, and Dr. Imogene Burn and Dr. Myra Gianotti did a bovine patch graft to this area.  Because of her arthrofibrosis from the surgery, she had to be manipulated.  She states she has never gone past 80 degrees of motion.  She states she was tolerating her pain fairly well; however, on Sunday, 02/28/2012, she was visiting a friend in the hospital, stood up, and had pain in the posterior and lateral aspects of the knee.  She had exploration of her right total knee replacement with irrigation, debridement, synovectomy and poly exchange on 03/08/2012 .  She had a staph coagulase-negative infection that was determined preoperatively.  Her cultures postop were negative despite not receiving any antibiotics.  She is being followed by the infectious disease team and she is personally on cephalexin 2 grams every 8 hours and rifampin 300 mg twice a day. Underwent manipulation of her right total knee replacement on 06/02/2012.  The family did have her lab values from her July 24 office visit.  Her sed rate was 10, her C-reactive protein was 0.8, so I think that is a very, very good indicator of lack of infection.   She has done well till recently.  Began having temp over 100 and painful swollen right knee.  Saw Dr Cleophas Dunker in [place] today with aspiration of purulent  material.  Admitted for I&D with removal of prosthesis and placement of antibiotic spacer.    PAST MEDICAL HISTORY: Patient Active Problem List   Diagnosis Date Noted  . Injury of popliteal artery 09/23/2012  . Aftercare following surgery of the circulatory system, NEC 09/23/2012  . Peripheral vascular disease, unspecified 09/23/2012  . Muscle soreness-Right popliteal 09/23/2012  . Septic arthritis 05/18/2012  . Coagulase-negative staphylococcal infection 05/18/2012   Past Medical History  Diagnosis Date  . Hypertension   . Thyroid disease   . Hiatal hernia   . Arthritis   . Diverticulosis   . Cancer     breast  . Cancer     Uterine  . Complication of anesthesia   . PONV (postoperative nausea and vomiting)   . Hypothyroidism    Past Surgical History  Procedure Laterality Date  . Total knee arthroplasty      left knee  . Popliteal artery repair  01/16/10    right with patch angioplasty  . Breast surgery    . I&d knee with poly exchange  03/08/2012    Procedure: IRRIGATION AND DEBRIDEMENT KNEE WITH POLY EXCHANGE;  Surgeon: Valeria Batman, MD;  Location: [facility];  Service: Orthopedics;  Laterality: Right;  Right Total Knee Irrigation & Debridement, Synovectomy, Poly Exchange, Placement of Antibiotic  Beads  . Joint replacement Right 01/17/11    Knee     MEDICATIONS:   Prescriptions prior to admission  Medication Sig Dispense Refill  . amLODipine (NORVASC) 5 MG tablet       . aspirin 81 MG tablet Take 81 mg by mouth daily.      . chlorpheniramine-HYDROcodone (TUSSIONEX) 10-8 MG/5ML LQCR       . docusate sodium 100 MG CAPS Take 100 mg by mouth 2 (two) times daily.  10 capsule  0  . ergocalciferol (VITAMIN D2) 50000 UNITS capsule Take 50,000 Units by mouth every 30 (thirty) days. Take on the 1st day of each month.      . indapamide (LOZOL) 1.25 MG tablet Take 1.25 mg by mouth daily.      Marland Kitchen levothyroxine (SYNTHROID, LEVOTHROID) 50 MCG tablet Take 50 mcg by mouth daily.       . magnesium hydroxide (MILK OF MAGNESIA) 400 MG/5ML suspension Take 30 mLs by mouth daily as needed.  360 mL  0  . methocarbamol (ROBAXIN) 500 MG tablet Take 500 mg by mouth daily as needed. For cramps.      Marland Kitchen oxyCODONE-acetaminophen (PERCOCET/ROXICET) 5-325 MG per tablet       . pravastatin (PRAVACHOL) 40 MG tablet Take 40 mg by mouth daily.      . traMADol (ULTRAM) 50 MG tablet         ALLERGIES:  No Known Allergies  REVIEW OF SYSTEMS:  A 14-point review of systems reveals positive for glasses.  She does have upper and lower partial dentures.  She does have a history of hypertension for the past 8-10 years and takes Lozol for this.  Occasional leg cramps also were noted.  Hematochezia and constipation at times.  She does have hypothyroidism and is on Synthroid.  She has had a left mastectomy for breast cancer and so far has been doing well.  She has had previous bladder infection.   FAMILY HISTORY:   Family History  Problem Relation Age of Onset  . Diabetes Mother   . Heart disease Father   . Stroke Father   . Hypertension Father   . Heart attack Father   . Cancer Sister   . Diabetes Sister   . Hyperlipidemia Sister   . Hypertension Sister   . Cancer Brother   . Diabetes Brother   . Hypertension Brother   . Hyperlipidemia Daughter     SOCIAL HISTORY:   History  Substance Use Topics  . Smoking status: Never Smoker   . Smokeless tobacco: Never Used  . Alcohol Use: No      EXAMINATION: Vital signs in last 24 hours: Temp:  [101.1 F (38.4 C)] 101.1 F (38.4 C) (12/17 1740) Pulse Rate:  [75] 75 (12/17 1740) Resp:  [16] 16 (12/17 1740) BP: (154)/(57) 154/57 mmHg (12/17 1740) SpO2:  [97 %] 97 % (12/17 1740)  Head is normocephalic.   Eyes:  Pupils equal, round and reactive to light and accommodation.  Extraocular intact. ENT: Ears, nose, and throat were benign.   Neck: supple, no bruits were noted.   Chest: good expansion.   Lungs: essentially clear.   Cardiac:  regular rhythm and rate, normal S1, S2.  No murmurs appreciated. Pulses :  trace bilateral and symmetric in lower extremities. Abdomen is scaphoid, soft, nontender, no masses palpable, normal bowel sounds present. CNS:  He is oriented x3 and cranial nerves II-XII grossly intact. Breast, rectal, and genital exams: not performed and not indicated  for an orthopedic evaluation. 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 Hypertension   . Thyroid disease   . Hiatal hernia   . Arthritis   . Diverticulosis   . Cancer     breast  . Cancer     Uterine  . Complication of anesthesia   . PONV (postoperative nausea and vomiting)   . Hypothyroidism     PLAN: Plan for  I&D RIGHT TOTAL KNEE WITH PROSTHETIC REMOVAL AND PLACEMENT OF ANTIBIOTIC SPACER  The procedure,  risks, and benefits of total knee arthroplasty were presented and reviewed. The risks including but not limited to infection, blood clots, vascular and nerve injury, stiffness,  among others were discussed. The patient acknowledged the explanation, agreed to proceed.   Liset Mcmonigle 03/15/2013, 7:15 PM

## 2013-03-15 NOTE — H&P (Signed)
  The recent History & Physical has been reviewed. I have personally examined the patient today. There is no interval change to the documented History & Physical. The patient would like to proceed with the procedure.  [name] W 03/15/2013,  7:18 PM

## 2013-03-16 ENCOUNTER — Encounter (HOSPITAL_COMMUNITY): Payer: Self-pay | Admitting: General Practice

## 2013-03-16 DIAGNOSIS — I10 Essential (primary) hypertension: Secondary | ICD-10-CM

## 2013-03-16 DIAGNOSIS — T8450XA Infection and inflammatory reaction due to unspecified internal joint prosthesis, initial encounter: Secondary | ICD-10-CM

## 2013-03-16 LAB — CBC
Platelets: 207 10*3/uL (ref 150–400)
RBC: 4.31 MIL/uL (ref 3.87–5.11)
RDW: 13.5 % (ref 11.5–15.5)
WBC: 12.5 10*3/uL — ABNORMAL HIGH (ref 4.0–10.5)

## 2013-03-16 LAB — BASIC METABOLIC PANEL
Chloride: 103 mEq/L (ref 96–112)
GFR calc Af Amer: 58 mL/min — ABNORMAL LOW (ref >90)
Glucose, Bld: 165 mg/dL — ABNORMAL HIGH (ref 70–99)
Potassium: 4.1 mEq/L (ref 3.5–5.1)

## 2013-03-16 LAB — C-REACTIVE PROTEIN: CRP: 10.9 mg/dL — ABNORMAL HIGH (ref <0.60)

## 2013-03-16 LAB — SEDIMENTATION RATE: Sed Rate: 50 mm/hr — ABNORMAL HIGH (ref 0–22)

## 2013-03-16 MED ORDER — FLEET ENEMA 7-19 GM/118ML RE ENEM
1.0000 | ENEMA | Freq: Once | RECTAL | Status: AC | PRN
Start: 2013-03-16 — End: 2013-03-16

## 2013-03-16 MED ORDER — INDAPAMIDE 1.25 MG PO TABS
1.2500 mg | ORAL_TABLET | Freq: Every day | ORAL | Status: DC
  Administered 2013-03-16 – 2013-03-20 (×5): 1.25 mg via ORAL
  Filled 2013-03-16 (×5): qty 1

## 2013-03-16 MED ORDER — DOCUSATE SODIUM 100 MG PO CAPS
100.0000 mg | ORAL_CAPSULE | Freq: Two times a day (BID) | ORAL | Status: DC
  Administered 2013-03-16 – 2013-03-20 (×9): 100 mg via ORAL
  Filled 2013-03-16 (×11): qty 1

## 2013-03-16 MED ORDER — SODIUM CHLORIDE 0.9 % IJ SOLN: 10.0000 mL | INTRAMUSCULAR | Status: DC | PRN

## 2013-03-16 MED ORDER — PREDNISOLONE ACETATE 1 % OP SUSP
1.0000 [drp] | Freq: Four times a day (QID) | OPHTHALMIC | Status: DC
  Filled 2013-03-16: qty 1

## 2013-03-16 MED ORDER — RIVAROXABAN 10 MG PO TABS
10.0000 mg | ORAL_TABLET | Freq: Every day | ORAL | Status: DC
  Administered 2013-03-16 – 2013-03-20 (×5): 10 mg via ORAL
  Filled 2013-03-16 (×5): qty 1

## 2013-03-16 MED ORDER — OXYCODONE-ACETAMINOPHEN 5-325 MG PO TABS
1.0000 | ORAL_TABLET | ORAL | Status: AC | PRN
  Administered 2013-03-16 – 2013-03-18 (×7): 2 via ORAL
  Administered 2013-03-18: 1 via ORAL
  Filled 2013-03-16 (×2): qty 2
  Filled 2013-03-16: qty 1
  Filled 2013-03-16 (×6): qty 2

## 2013-03-16 MED ORDER — CEFAZOLIN SODIUM-DEXTROSE 2-3 GM-% IV SOLR
2.0000 g | Freq: Four times a day (QID) | INTRAVENOUS | Status: DC
  Administered 2013-03-16 (×2): 2 g via INTRAVENOUS
  Filled 2013-03-16 (×4): qty 50

## 2013-03-16 MED ORDER — SIMVASTATIN 20 MG PO TABS
20.0000 mg | ORAL_TABLET | Freq: Every day | ORAL | Status: DC
  Administered 2013-03-16 – 2013-03-19 (×4): 20 mg via ORAL
  Filled 2013-03-16 (×5): qty 1

## 2013-03-16 MED ORDER — AMLODIPINE BESYLATE 5 MG PO TABS
5.0000 mg | ORAL_TABLET | Freq: Once | ORAL | Status: AC
  Administered 2013-03-16: 5 mg via ORAL
  Filled 2013-03-16: qty 1

## 2013-03-16 MED ORDER — ALUM & MAG HYDROXIDE-SIMETH 200-200-20 MG/5ML PO SUSP: 30.0000 mL | ORAL | Status: DC | PRN

## 2013-03-16 MED ORDER — AMLODIPINE BESYLATE 5 MG PO TABS
5.0000 mg | ORAL_TABLET | Freq: Every day | ORAL | Status: DC
  Administered 2013-03-16 – 2013-03-17 (×2): 5 mg via ORAL
  Filled 2013-03-16 (×2): qty 1

## 2013-03-16 MED ORDER — METOCLOPRAMIDE HCL 5 MG/ML IJ SOLN: 5.0000 mg | Freq: Three times a day (TID) | INTRAMUSCULAR | Status: DC | PRN

## 2013-03-16 MED ORDER — ONDANSETRON HCL 4 MG PO TABS: 4.0000 mg | ORAL_TABLET | Freq: Four times a day (QID) | ORAL | Status: DC | PRN

## 2013-03-16 MED ORDER — ONDANSETRON HCL 4 MG/2ML IJ SOLN
4.0000 mg | Freq: Four times a day (QID) | INTRAMUSCULAR | Status: DC | PRN
  Administered 2013-03-18: 4 mg via INTRAVENOUS
  Filled 2013-03-16: qty 2

## 2013-03-16 MED ORDER — PHENOL 1.4 % MT LIQD: 1.0000 | OROMUCOSAL | Status: DC | PRN

## 2013-03-16 MED ORDER — MAGNESIUM HYDROXIDE 400 MG/5ML PO SUSP: 30.0000 mL | Freq: Every day | ORAL | Status: DC | PRN

## 2013-03-16 MED ORDER — BISACODYL 10 MG RE SUPP
10.0000 mg | Freq: Every day | RECTAL | Status: DC | PRN
  Administered 2013-03-18: 10 mg via RECTAL
  Filled 2013-03-16: qty 1

## 2013-03-16 MED ORDER — PILOCARPINE HCL 2 % OP SOLN
1.0000 [drp] | Freq: Every day | OPHTHALMIC | Status: DC
  Filled 2013-03-16: qty 15

## 2013-03-16 MED ORDER — SODIUM CHLORIDE 0.9 % IV SOLN
INTRAVENOUS | Status: DC
  Administered 2013-03-16: via INTRAVENOUS

## 2013-03-16 MED ORDER — METHOCARBAMOL 500 MG PO TABS
500.0000 mg | ORAL_TABLET | Freq: Four times a day (QID) | ORAL | Status: DC | PRN
  Administered 2013-03-16 – 2013-03-20 (×5): 500 mg via ORAL
  Filled 2013-03-16 (×5): qty 1

## 2013-03-16 MED ORDER — METHOCARBAMOL 100 MG/ML IJ SOLN: 500.0000 mg | Freq: Four times a day (QID) | INTRAVENOUS | Status: DC | PRN

## 2013-03-16 MED ORDER — ACETAMINOPHEN 650 MG RE SUPP: 650.0000 mg | Freq: Four times a day (QID) | RECTAL | Status: DC | PRN

## 2013-03-16 MED ORDER — CEFAZOLIN SODIUM-DEXTROSE 2-3 GM-% IV SOLR
2.0000 g | Freq: Three times a day (TID) | INTRAVENOUS | Status: DC
  Administered 2013-03-16 – 2013-03-20 (×13): 2 g via INTRAVENOUS
  Filled 2013-03-16 (×15): qty 50

## 2013-03-16 MED ORDER — OXYCODONE HCL 5 MG PO TABS
5.0000 mg | ORAL_TABLET | ORAL | Status: DC | PRN
  Administered 2013-03-16: 10 mg via ORAL
  Administered 2013-03-16 – 2013-03-17 (×3): 5 mg via ORAL
  Administered 2013-03-18 – 2013-03-20 (×12): 10 mg via ORAL
  Filled 2013-03-16 (×2): qty 2
  Filled 2013-03-16: qty 1
  Filled 2013-03-16 (×2): qty 2
  Filled 2013-03-16: qty 1
  Filled 2013-03-16 (×7): qty 2
  Filled 2013-03-16: qty 1
  Filled 2013-03-16 (×3): qty 2

## 2013-03-16 MED ORDER — MENTHOL 3 MG MT LOZG: 1.0000 | LOZENGE | OROMUCOSAL | Status: DC | PRN

## 2013-03-16 MED ORDER — ACETAZOLAMIDE 250 MG PO TABS
250.0000 mg | ORAL_TABLET | Freq: Two times a day (BID) | ORAL | Status: DC
  Filled 2013-03-16 (×3): qty 1

## 2013-03-16 MED ORDER — PREDNISOLONE ACETATE 1 % OP SUSP
1.0000 [drp] | Freq: Every morning | OPHTHALMIC | Status: DC
  Administered 2013-03-17 – 2013-03-20 (×3): 1 [drp] via OPHTHALMIC
  Filled 2013-03-16: qty 1

## 2013-03-16 MED ORDER — LEVOTHYROXINE SODIUM 50 MCG PO TABS
50.0000 ug | ORAL_TABLET | Freq: Every day | ORAL | Status: DC
  Administered 2013-03-16 – 2013-03-20 (×5): 50 ug via ORAL
  Filled 2013-03-16 (×6): qty 1

## 2013-03-16 MED ORDER — HYDROMORPHONE HCL PF 1 MG/ML IJ SOLN
0.5000 mg | INTRAMUSCULAR | Status: DC | PRN
  Administered 2013-03-16 – 2013-03-20 (×7): 0.5 mg via INTRAVENOUS
  Filled 2013-03-16 (×7): qty 1

## 2013-03-16 MED ORDER — METOCLOPRAMIDE HCL 10 MG PO TABS: 5.0000 mg | ORAL_TABLET | Freq: Three times a day (TID) | ORAL | Status: DC | PRN

## 2013-03-16 MED ORDER — HYDRALAZINE HCL 20 MG/ML IJ SOLN
10.0000 mg | INTRAMUSCULAR | Status: DC | PRN
  Administered 2013-03-16: 10 mg via INTRAVENOUS
  Filled 2013-03-16: qty 1

## 2013-03-16 MED ORDER — ACETAMINOPHEN 325 MG PO TABS
650.0000 mg | ORAL_TABLET | Freq: Four times a day (QID) | ORAL | Status: DC | PRN
  Administered 2013-03-17: 650 mg via ORAL
  Filled 2013-03-16: qty 2

## 2013-03-16 MED ORDER — POLYETHYLENE GLYCOL 3350 17 G PO PACK
17.0000 g | PACK | Freq: Every day | ORAL | Status: DC
  Administered 2013-03-17 – 2013-03-20 (×3): 17 g via ORAL
  Filled 2013-03-16 (×5): qty 1

## 2013-03-16 NOTE — Clinical Social Work Placement (Addendum)
Clinical Social Work Department  CLINICAL SOCIAL WORK PLACEMENT NOTE    Patient: [name]  Account Number: [id number] Admit date: 03/15/13  Clinical Social Worker: Sabino Niemann LCSWA Date/time: 03/15/13 11:30 AM  Clinical Social Work is seeking post-discharge placement for this patient at the following level of care: SKILLED NURSING (*CSW will update this form in Epic as items are completed)  03/16/2013 Patient/family provided with [facility] Department of Clinical Social Work's list of facilities offering this level of care within the geographic area requested by the patient (or if unable, by the patient's family).  12/18/2014Patient/family informed of their freedom to choose among providers that offer the needed level of care, that participate in Medicare, Medicaid or managed care program needed by the patient, have an available bed and are willing to accept the patient.  03/16/2013 Patient/family informed of [facility]' ownership interest in [facility], as well as of the fact that they are under no obligation to receive care at this facility.  PASARR submitted to EDS on Pre-existing PASARR number received from EDS on  FL2 transmitted to all facilities in geographic area requested by pt/family on 03/16/2013 FL2 transmitted to all facilities within larger geographic area on  Patient informed that his/her managed care company has contracts with or will negotiate with certain facilities, including the following:  Patient/family informed of bed offers received: 03/16/2013 Patient chooses bed at [facility]- she has been to the facility in the past. [facility] did not make a bed offer so patient chooses bed at [facility].Jetta Lout, LCSWA- 03/20/13  Physician recommends and patient chooses bed at  Patient to be transferred to [facility] on 03/20/13- Jetta Lout, LCSWA-03/20/13  Patient to be transferred to facility by Non-emergency EMS ([facility])- Jetta Lout,  LCSWA-03/20/13  The following physician request were entered in Epic:  Additional Comments:

## 2013-03-16 NOTE — Progress Notes (Signed)
At 1830 pt unable to void, I&D completed, 350 ml of urine emptied from bladder.  Nsg to continue to monitor.

## 2013-03-16 NOTE — Consult Note (Signed)
[facility] Initial Consult Note  [name]  ZHY:[id number]  DOB: [birth year]  DOA: 03/15/2013 DOS: the patient was seen and examined on 03/16/2013   Referring physician: Jacqualine Code, PA-C PCP: Donata Duff, DO   Reason for consult: Hypertension  HPI: [name] is a 79 y.o. female with Past medical history of hypertension, hypothyroidism, dyslipidemia, GERD. The patient is 79 year old female who presented with the complaint of painful right knee status post arthroplasty. She had any exploration with irrigation and debridement of in 2013. She was found to have coagulase-negative strep a was infection in the knee. Since last few days she started having complaints of swollen knee and had a fever for which she was seen by her physician who did a knee aspiration and was found to have pleural material she had elevated ESR and CRP and she underwent exploration of the right total knee replacement with insertion of antibiotic spacer. Since this morning her blood pressure has been elevated. The patient currently denies any symptoms of any fever, chills, headache, cough, chest pain, palpitation, shortness of breath, orthopnea, PND, nausea, vomiting, abdominal pain, diarrhea, constipation, active bleeding, burning urination, dizziness, pedal edema,  focal neurological deficit.  She mentions that she has not been able to urinate and that she had to undergo in and out catheterization once. She hasn't had any bowel movement since yesterday. She mentions Dilaudid and OxyIR are controlling her pain for few hours only and that her pain which is up to 8-10.  Review of Systems: as mentioned in the history of present illness.  A Comprehensive review of the other systems is negative.  Past Medical History  Diagnosis Date  . Hypertension   . Thyroid disease   . Hiatal hernia   . Diverticulosis   . Complication of anesthesia   . PONV (postoperative nausea and vomiting)   . Hypothyroidism    . High cholesterol   . Chronic bronchitis     "yearly for the last 3-4 years" (03/16/2013)  . GERD (gastroesophageal reflux disease)   . Arthritis     "joints" (03/16/2013)  . Breast cancer   . Uterine cancer    Past Surgical History  Procedure Laterality Date  . Total knee arthroplasty Left 2007  . Patch angioplasty Right 01/16/10    popliteal artery repair  . I&d knee with poly exchange  03/08/2012    Procedure: IRRIGATION AND DEBRIDEMENT KNEE WITH POLY EXCHANGE;  Surgeon: Valeria Batman, MD;  Location: [facility] OR;  Service: Orthopedics;  Laterality: Right;  Right Total Knee Irrigation & Debridement, Synovectomy, Poly Exchange, Placement of Antibiotic Beads  . Joint replacement    . Total knee  prosthesis removal w/ spacer insertion Right 03/15/2013  . Appendectomy    . Total knee arthroplasty Right 01/17/2011  . Mastectomy Left ~ 2005  . Breast biopsy Left   . Glaucoma surgery Bilateral 03/01/2013-03/08/2013  . Abdominal hysterectomy      "left an ovary" (03/16/2013)  . I&d extremity Right 03/15/2013    Procedure: Exploration right total knee replacement , removal of components, and insertion of antibiotic spacer.;  Surgeon: Valeria Batman, MD;  Location: [facility] OR;  Service: Orthopedics;  Laterality: Right;   Social History:  reports that she has never smoked. She has never used smokeless tobacco. She reports that she drinks alcohol. She reports that she does not use illicit drugs. Patient is coming from home.  Allergies  Allergen Reactions  . Naproxen     bleeding stomach  . Nsaids  Bleeding stomach    Family History  Problem Relation Age of Onset  . Diabetes Mother   . Heart disease Father   . Stroke Father   . Hypertension Father   . Heart attack Father   . Cancer Sister   . Diabetes Sister   . Hyperlipidemia Sister   . Hypertension Sister   . Cancer Brother   . Diabetes Brother   . Hypertension Brother   . Hyperlipidemia Daughter     Prior to Admission  medications   Medication Sig Start Date End Date Taking? Authorizing Provider  amLODipine (NORVASC) 5 MG tablet Take 5 mg by mouth at bedtime.   Yes Historical Provider, MD  aspirin 81 MG tablet Take 81 mg by mouth daily.   Yes Historical Provider, MD  docusate sodium (COLACE) 100 MG capsule Take 100 mg by mouth 2 (two) times daily.   Yes Historical Provider, MD  ergocalciferol (VITAMIN D2) 50000 UNITS capsule Take 50,000 Units by mouth every 30 (thirty) days. Take on the 1st day of each month.   Yes Historical Provider, MD  levothyroxine (SYNTHROID, LEVOTHROID) 50 MCG tablet Take 50 mcg by mouth daily.   Yes Historical Provider, MD  methocarbamol (ROBAXIN) 500 MG tablet Take 500 mg by mouth daily as needed. For cramps.   Yes Historical Provider, MD  pravastatin (PRAVACHOL) 40 MG tablet Take 40 mg by mouth daily.   Yes Historical Provider, MD  prednisoLONE acetate (PRED FORTE) 1 % ophthalmic suspension Place 1 drop into the left eye daily.   Yes Historical Provider, MD  traMADol (ULTRAM) 50 MG tablet Take 50 mg by mouth daily.   Yes Historical Provider, MD    Physical Exam: Filed Vitals:   03/16/13 0207 03/16/13 0305 03/16/13 0400 03/16/13 1400  BP: 160/59 178/65 174/51 163/52  Pulse: 59 64 66 61  Temp: 98.8 F (37.1 C) 98 F (36.7 C) 97.7 F (36.5 C) 98.1 F (36.7 C)  Resp: 17 17 18 15   SpO2: 99% 92% 98% 95%    General: Alert, Awake and Oriented to Time, Place and Person. Appear in mild distress Eyes: PERRL ENT: Oral Mucosa clearmoist. Neck: No JVD, no Carotid Bruits  Cardiovascular: S1 and S2 Present, aortic systolic Murmur, Peripheral Pulses Present Respiratory: Bilateral Air entry equal and Decreased, Clear to Auscultation,  No Crackles, no wheezes Abdomen: Bowel Sound Present, Soft and Non tender  Skin: No Rash Extremities: Right knee dressing, no Pedal edema, no calf tenderness Neurologic: Grossly Unremarkable.  Labs:  Basic Metabolic Panel:  Recent Labs Lab  03/15/13 1820 03/16/13 0509  NA 136 136  K 3.8 4.1  CL 101 103  CO2 26 24  GLUCOSE 101* 165*  BUN 14 12  CREATININE 1.03 1.00  CALCIUM 8.8 8.3*   Liver Function Tests:  Recent Labs Lab 03/15/13 1820  AST 16  ALT 11  ALKPHOS 104  BILITOT 0.5  PROT 7.2  ALBUMIN 3.7   No results found for this basename: LIPASE, AMYLASE,  in the last 168 hours No results found for this basename: AMMONIA,  in the last 168 hours CBC:  Recent Labs Lab 03/15/13 1820 03/16/13 0509  WBC 10.4 12.5*  NEUTROABS 8.4*  --   HGB 14.5 13.5  HCT 40.9 38.6  MCV 88.7 89.6  PLT 201 207   Cardiac Enzymes: No results found for this basename: CKTOTAL, CKMB, CKMBINDEX, TROPONINI,  in the last 168 hours  BNP (last 3 results) No results found for this basename: PROBNP,  in the last 8760 hours CBG: No results found for this basename: GLUCAP,  in the last 168 hours  Radiological Exams: Chest 2 View  03/15/2013   CLINICAL DATA:  Preoperative examination  EXAM: CHEST  2 VIEW  COMPARISON:  03/08/2012; 01/10/2010; 09/04/2003  FINDINGS: Grossly unchanged borderline enlarged cardiac silhouette and mediastinal contours. Evaluation of retrosternal scar space is obscured secondary to overlying soft tissues. No focal airspace opacities. No pleural effusion or pneumothorax. No evidence of edema. Post left-sided mastectomy. Surgical clips overlie the left axilla. Grossly unchanged bones including high-riding right humeral head suggestive of rotator cuff pathology.  IMPRESSION: No acute cardiopulmonary disease.   Electronically Signed   By: Simonne Come M.D.   On: 03/15/2013 18:19    EKG: Independently reviewed. No acute ischemia  Assessment/Plan Principal Problem:   Infection of total knee replacement Active Problems:   Infection of total joint prosthesis   1. Accelerated hypertension Likely secondary to pain and urinary retention. I recommend to place her on Percocet along with OxyIR and Dilaudid. Hopefully  with Percocet for pain will be controlled for longer duration and for breakthrough she can continues to use oxy or Dilaudid. I will give her an extra dose of amlodipine, increase of amlodipine for those from tomorrow and place her on when necessary hydralazine for blood pressure control. If her pain is well-controlled which brings her blood pressure down then we can reduce the dose of amlodipine  2. Bowel regimen Adding MiraLax  3. Urinary retention If she is unaware to void I would recommend to insert a Foley catheter  Family Communication: Family was present and was explained about the plan   Thank you very much for involving [place] in the care of your patient we will continue to follow her.  Author: Lynden Oxford, MD [facility] Pager: [phone] 03/16/2013 8:48 PM    If 7PM-7AM, please contact night-coverage [email or web address] Password [email or web address]

## 2013-03-16 NOTE — Consult Note (Signed)
INFECTIOUS DISEASE CONSULT NOTE  Date of Admission:  03/15/2013  Date of Consult:  03/16/2013  Reason for Consult: Infected R TKR Referring Physician: Cleophas Dunker  Impression/Recommendation Infected R TKR Resected 03-15-13  Would Place PIC Continue ancef, change dose to q8 Hold rifampin Await Cx from OR and from outpt  Comment- The description of her fluid and CRP certainly point to recurrence. Will let Dr Daiva Eves know she has been admitted.  Explained to pt and family.   Thank you so much for this interesting consult,   [name] (pager) [phone] [email or web address]  [name] is an 79 y.o. female.  HPI: 79 yo F with hx of R TKR 10-09. Her surgery was complicated by popliteal artery injury requiring patch. 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 She has had manipulation of her knee 04-2010, I & D/exchange of parts 03-09-2012. She had CoAg negative staph in her pre-operative Cx (R- pen). She was treated with ancef/rifampin then transitioned to keflex and rifampin. She again underwent manipulation of her knee 05-2012.  She was maintained on keflex/rifampin until 11-2012 when it was noted that her ESR and CRP were normal.  She saw ortho recently with swelling, pain and temp to 100. She had aspiration in office and was admitted on 12-17 and underwent removal of her prosthesis with placement of anbx spacer.   No recent trauma or skin injury.   Past Medical History  Diagnosis Date  . Hypertension   . Thyroid disease   . Hiatal hernia   . Diverticulosis   . Complication of anesthesia   . PONV (postoperative nausea and vomiting)   . Hypothyroidism   . High cholesterol   . Chronic bronchitis     "yearly for the last 3-4 years" (03/16/2013)  . GERD (gastroesophageal reflux disease)   . Arthritis     "joints" (03/16/2013)  . Breast cancer   . Uterine cancer     Past Surgical History  Procedure Laterality Date  . Total knee arthroplasty Left 2007   . Patch angioplasty Right 01/16/10    popliteal artery repair  . I&d knee with poly exchange  03/08/2012    Procedure: IRRIGATION AND DEBRIDEMENT KNEE WITH POLY EXCHANGE;  Surgeon: Valeria Batman, MD;  Location: [facility] OR;  Service: Orthopedics;  Laterality: Right;  Right Total Knee Irrigation & Debridement, Synovectomy, Poly Exchange, Placement of Antibiotic Beads  . Joint replacement    . Total knee  prosthesis removal w/ spacer insertion Right 03/15/2013  . Appendectomy    . Total knee arthroplasty Right 01/17/2011  . Mastectomy Left ~ 2005  . Breast biopsy Left   . Glaucoma surgery Bilateral 03/01/2013-03/08/2013  . Abdominal hysterectomy      "left an ovary" (03/16/2013)     Allergies  Allergen Reactions  . Naproxen     bleeding stomach  . Nsaids     Bleeding stomach    Medications:  Scheduled: . acetaZOLAMIDE  250 mg Oral BID  . amLODipine  5 mg Oral Daily  .  ceFAZolin (ANCEF) IV  2 g Intravenous Q6H  . docusate sodium  100 mg Oral BID  . indapamide  1.25 mg Oral Daily  . levothyroxine  50 mcg Oral QAC breakfast  . pilocarpine  1 drop Both Eyes Daily  . prednisoLONE acetate  1 drop Both Eyes QID  . rivaroxaban  10 mg Oral Daily  . simvastatin  20 mg Oral q1800    Total days of antibiotics: 1 (ancef)  Social History:  reports that she has never smoked. She has never used smokeless tobacco. She reports that she drinks alcohol. She reports that she does not use illicit drugs.  Family History  Problem Relation Age of Onset  . Diabetes Mother   . Heart disease Father   . Stroke Father   . Hypertension Father   . Heart attack Father   . Cancer Sister   . Diabetes Sister   . Hyperlipidemia Sister   . Hypertension Sister   . Cancer Brother   . Diabetes Brother   . Hypertension Brother   . Hyperlipidemia Daughter     General ROS: had R eye glaucoma surgery on 3rd and 10th of this month, normal BM, normal urination, eating well, no change in wt. no  proximal erythema.  please see HPI.  Blood pressure 174/51, pulse 66, temperature 97.7 F (36.5 C), resp. rate 18, SpO2 98.00%. General appearance: alert, cooperative and no distress Eyes: negative findings: EOMI Throat: normal findings: oropharynx pink & moist without lesions or evidence of thrush Neck: no adenopathy and supple, symmetrical, trachea midline Lungs: clear to auscultation bilaterally Heart: regular rate and rhythm and systolic murmur: early systolic 2/6, crescendo at 2nd right intercostal space Abdomen: normal findings: bowel sounds normal and soft, non-tender Extremities: RLE wrapped. decreased light touch in R foot. no pedal lesions.    Results for orders placed during the hospital encounter of 03/15/13 (from the past 48 hour(s))  URINALYSIS, ROUTINE W REFLEX MICROSCOPIC     Status: Abnormal   Collection Time    03/15/13  5:27 PM      Result Value Range   Color, Urine YELLOW  YELLOW   APPearance CLEAR  CLEAR   Specific Gravity, Urine 1.015  1.005 - 1.030   pH 6.0  5.0 - 8.0   Glucose, UA NEGATIVE  NEGATIVE mg/dL   Hgb urine dipstick TRACE (*) NEGATIVE   Bilirubin Urine NEGATIVE  NEGATIVE   Ketones, ur NEGATIVE  NEGATIVE mg/dL   Protein, ur NEGATIVE  NEGATIVE mg/dL   Urobilinogen, UA 0.2  0.0 - 1.0 mg/dL   Nitrite NEGATIVE  NEGATIVE   Leukocytes, UA SMALL (*) NEGATIVE  URINE MICROSCOPIC-ADD ON     Status: Abnormal   Collection Time    03/15/13  5:27 PM      Result Value Range   Squamous Epithelial / LPF FEW (*) RARE   WBC, UA 3-6  <3 WBC/hpf   RBC / HPF 0-2  <3 RBC/hpf   Bacteria, UA FEW (*) RARE  MRSA PCR SCREENING     Status: None   Collection Time    03/15/13  5:30 PM      Result Value Range   MRSA by PCR NEGATIVE  NEGATIVE   Comment:            The GeneXpert MRSA Assay (FDA     approved for NASAL specimens     only), is one component of a     comprehensive MRSA colonization     surveillance program. It is not     intended to diagnose MRSA      infection nor to guide or     monitor treatment for     MRSA infections.  COMPREHENSIVE METABOLIC PANEL     Status: Abnormal   Collection Time    03/15/13  6:20 PM      Result Value Range   Sodium 136  135 - 145 mEq/L   Potassium 3.8  3.5 - 5.1 mEq/L   Chloride 101  96 - 112 mEq/L   CO2 26  19 - 32 mEq/L   Glucose, Bld 101 (*) 70 - 99 mg/dL   BUN 14  6 - 23 mg/dL   Creatinine, Ser 4.09  0.50 - 1.10 mg/dL   Calcium 8.8  8.4 - 81.1 mg/dL   Total Protein 7.2  6.0 - 8.3 g/dL   Albumin 3.7  3.5 - 5.2 g/dL   AST 16  0 - 37 U/L   ALT 11  0 - 35 U/L   Alkaline Phosphatase 104  39 - 117 U/L   Total Bilirubin 0.5  0.3 - 1.2 mg/dL   GFR calc non Af Amer 48 (*) >90 mL/min   GFR calc Af Amer 56 (*) >90 mL/min   Comment: (NOTE)     The eGFR has been calculated using the CKD EPI equation.     This calculation has not been validated in all clinical situations.     eGFR's persistently <90 mL/min signify possible Chronic Kidney     Disease.  PROTIME-INR     Status: None   Collection Time    03/15/13  6:20 PM      Result Value Range   Prothrombin Time 13.1  11.6 - 15.2 seconds   INR 1.01  0.00 - 1.49  APTT     Status: None   Collection Time    03/15/13  6:20 PM      Result Value Range   aPTT 30  24 - 37 seconds  CBC WITH DIFFERENTIAL     Status: Abnormal   Collection Time    03/15/13  6:20 PM      Result Value Range   WBC 10.4  4.0 - 10.5 K/uL   RBC 4.61  3.87 - 5.11 MIL/uL   Hemoglobin 14.5  12.0 - 15.0 g/dL   HCT 91.4  78.2 - 95.6 %   MCV 88.7  78.0 - 100.0 fL   MCH 31.5  26.0 - 34.0 pg   MCHC 35.5  30.0 - 36.0 g/dL   RDW 21.3  08.6 - 57.8 %   Platelets 201  150 - 400 K/uL   Neutrophils Relative % 81 (*) 43 - 77 %   Neutro Abs 8.4 (*) 1.7 - 7.7 K/uL   Lymphocytes Relative 13  12 - 46 %   Lymphs Abs 1.4  0.7 - 4.0 K/uL   Monocytes Relative 5  3 - 12 %   Monocytes Absolute 0.5  0.1 - 1.0 K/uL   Eosinophils Relative 0  0 - 5 %   Eosinophils Absolute 0.0  0.0 - 0.7 K/uL    Basophils Relative 0  0 - 1 %   Basophils Absolute 0.0  0.0 - 0.1 K/uL  TYPE AND SCREEN     Status: None   Collection Time    03/15/13  6:20 PM      Result Value Range   ABO/RH(D) A POS     Antibody Screen NEG     Sample Expiration 03/18/2013    ANAEROBIC CULTURE     Status: None   Collection Time    03/15/13  9:00 PM      Result Value Range   Specimen Description WOUND KNEE RIGHT     Special Requests NONE     Gram Stain       Value: FEW WBC PRESENT,BOTH PMN AND MONONUCLEAR     NO SQUAMOUS EPITHELIAL CELLS SEEN  NO ORGANISMS SEEN     Performed at [facility]   Culture       Value: NO ANAEROBES ISOLATED; CULTURE IN PROGRESS FOR 5 DAYS     Performed at [facility]   Report Status PENDING    WOUND CULTURE     Status: None   Collection Time    03/15/13  9:00 PM      Result Value Range   Specimen Description WOUND RIGHT KNEE     Special Requests NONE     Gram Stain       Value: FEW WBC PRESENT,BOTH PMN AND MONONUCLEAR     NO SQUAMOUS EPITHELIAL CELLS SEEN     NO ORGANISMS SEEN     Performed at [facility]   Culture       Value: NO GROWTH     Performed at [facility]   Report Status PENDING    CBC     Status: Abnormal   Collection Time    03/16/13  5:09 AM      Result Value Range   WBC 12.5 (*) 4.0 - 10.5 K/uL   RBC 4.31  3.87 - 5.11 MIL/uL   Hemoglobin 13.5  12.0 - 15.0 g/dL   HCT 16.1  09.6 - 04.5 %   MCV 89.6  78.0 - 100.0 fL   MCH 31.3  26.0 - 34.0 pg   MCHC 35.0  30.0 - 36.0 g/dL   RDW 40.9  81.1 - 91.4 %   Platelets 207  150 - 400 K/uL  BASIC METABOLIC PANEL     Status: Abnormal   Collection Time    03/16/13  5:09 AM      Result Value Range   Sodium 136  135 - 145 mEq/L   Potassium 4.1  3.5 - 5.1 mEq/L   Chloride 103  96 - 112 mEq/L   CO2 24  19 - 32 mEq/L   Glucose, Bld 165 (*) 70 - 99 mg/dL   BUN 12  6 - 23 mg/dL   Creatinine, Ser 7.82  0.50 - 1.10 mg/dL   Calcium 8.3 (*) 8.4 - 10.5 mg/dL   GFR calc non Af Amer  50 (*) >90 mL/min   GFR calc Af Amer 58 (*) >90 mL/min   Comment: (NOTE)     The eGFR has been calculated using the CKD EPI equation.     This calculation has not been validated in all clinical situations.     eGFR's persistently <90 mL/min signify possible Chronic Kidney     Disease.  SEDIMENTATION RATE     Status: Abnormal   Collection Time    03/16/13  5:09 AM      Result Value Range   Sed Rate 50 (*) 0 - 22 mm/hr      Component Value Date/Time   SDES WOUND KNEE RIGHT 03/15/2013 2100   SDES WOUND RIGHT KNEE 03/15/2013 2100   SPECREQUEST NONE 03/15/2013 2100   SPECREQUEST NONE 03/15/2013 2100   CULT  Value: NO ANAEROBES ISOLATED; CULTURE IN PROGRESS FOR 5 DAYS Performed at [facility] 03/15/2013 2100   CULT  Value: NO GROWTH Performed at [facility] 03/15/2013 2100   REPTSTATUS PENDING 03/15/2013 2100   REPTSTATUS PENDING 03/15/2013 2100   Chest 2 View  03/15/2013   CLINICAL DATA:  Preoperative examination  EXAM: CHEST  2 VIEW  COMPARISON:  03/08/2012; 01/10/2010; 09/04/2003  FINDINGS: Grossly unchanged borderline enlarged cardiac silhouette and mediastinal  contours. Evaluation of retrosternal scar space is obscured secondary to overlying soft tissues. No focal airspace opacities. No pleural effusion or pneumothorax. No evidence of edema. Post left-sided mastectomy. Surgical clips overlie the left axilla. Grossly unchanged bones including high-riding right humeral head suggestive of rotator cuff pathology.  IMPRESSION: No acute cardiopulmonary disease.   Electronically Signed   By: Simonne Come M.D.   On: 03/15/2013 18:19   Recent Results (from the past 240 hour(s))  MRSA PCR SCREENING     Status: None   Collection Time    03/15/13  5:30 PM      Result Value Range Status   MRSA by PCR NEGATIVE  NEGATIVE Final   Comment:            The GeneXpert MRSA Assay (FDA     approved for NASAL specimens     only), is one component of a     comprehensive MRSA colonization      surveillance program. It is not     intended to diagnose MRSA     infection nor to guide or     monitor treatment for     MRSA infections.  ANAEROBIC CULTURE     Status: None   Collection Time    03/15/13  9:00 PM      Result Value Range Status   Specimen Description WOUND KNEE RIGHT   Final   Special Requests NONE   Final   Gram Stain     Final   Value: FEW WBC PRESENT,BOTH PMN AND MONONUCLEAR     NO SQUAMOUS EPITHELIAL CELLS SEEN     NO ORGANISMS SEEN     Performed at [facility]   Culture     Final   Value: NO ANAEROBES ISOLATED; CULTURE IN PROGRESS FOR 5 DAYS     Performed at [facility]   Report Status PENDING   Incomplete  WOUND CULTURE     Status: None   Collection Time    03/15/13  9:00 PM      Result Value Range Status   Specimen Description WOUND RIGHT KNEE   Final   Special Requests NONE   Final   Gram Stain     Final   Value: FEW WBC PRESENT,BOTH PMN AND MONONUCLEAR     NO SQUAMOUS EPITHELIAL CELLS SEEN     NO ORGANISMS SEEN     Performed at [facility]   Culture     Final   Value: NO GROWTH     Performed at [facility]   Report Status PENDING   Incomplete      03/16/2013, 10:48 AM     LOS: 1 day

## 2013-03-16 NOTE — Progress Notes (Signed)
Peripherally Inserted Central Catheter/Midline Placement  The IV Nurse has discussed with the patient and/or persons authorized to consent for the patient, the purpose of this procedure and the potential benefits and risks involved with this procedure.  The benefits include less needle sticks, lab draws from the catheter and patient may be discharged home with the catheter.  Risks include, but not limited to, infection, bleeding, blood clot (thrombus formation), and puncture of an artery; nerve damage and irregular heat beat.  Alternatives to this procedure were also discussed.  PICC/Midline Placement Documentation  PICC / Midline Single Lumen 03/10/12 PICC Right Basilic (Active)       Franne Grip Renee 03/16/2013, 12:47 PM

## 2013-03-16 NOTE — Progress Notes (Signed)
Utilization review completed.  

## 2013-03-16 NOTE — Evaluation (Signed)
Physical Therapy Evaluation Patient Details Name: [name] MRN: [id number] DOB: January 25, 1926 Today's Date: 03/16/2013 Time: 7829-5621 PT Time Calculation (min): 20 min  PT Assessment / Plan / Recommendation History of Present Illness  Pt is an 79 y/o female admitted s/p I&D of infected R TKA. Components were removed and antibiotic spacer was inserted. Pt is now PWB 50% on the right.   Clinical Impression  This patient presents with acute pain and decreased functional independence following the above mentioned procedure. At the time of PT eval, pt was in increased pain, and session/pain meds were coordinated appropriately with nursing. This patient is appropriate for skilled PT interventions to address functional limitations, improve safety and independence with functional mobility, and return to PLOF.    PT Assessment  Patient needs continued PT services    Follow Up Recommendations  SNF    Does the patient have the potential to tolerate intense rehabilitation      Barriers to Discharge        Equipment Recommendations  None recommended by PT (Equipment TBD by next venue of care)    Recommendations for Other Services     Frequency Min 6X/week    Precautions / Restrictions Precautions Precautions: Fall;Knee Precaution Comments: Discussed with pt and family to have no pillow under the knee. Required Braces or Orthoses: Knee Immobilizer - Right Knee Immobilizer - Right: Other (comment) (No order, but pt was wearing at all times during session) Restrictions Weight Bearing Restrictions: Yes RLE Weight Bearing: Partial weight bearing RLE Partial Weight Bearing Percentage or Pounds: 50   Pertinent Vitals/Pain 8/10 at rest after ambulation      Mobility  Bed Mobility Bed Mobility: Supine to Sit;Sitting - Scoot to Edge of Bed Supine to Sit: 4: Min assist;HOB elevated;With rails Sitting - Scoot to Edge of Bed: 4: Min assist Details for Bed Mobility Assistance: VC's for  sequencing and technique. Bed pad was used to assist pt to EOB. Transfers Transfers: Sit to Stand;Stand to [employer] Transfers Sit to Stand: 4: Min assist;From bed;With upper extremity assist Stand to Sit: 4: Min guard;To chair/3-in-1;With upper extremity assist Stand Pivot Transfers: 4: Min assist Details for Transfer Assistance: VC's for hand placement on seated surface. Assist required for walker placement especially during turns.  Ambulation/Gait Ambulation/Gait Assistance: Not tested (comment)    Exercises     PT Diagnosis: Difficulty walking;Acute pain  PT Problem List: Decreased strength;Decreased activity tolerance;Decreased range of motion;Decreased balance;Decreased mobility;Decreased knowledge of use of DME;Decreased safety awareness;Decreased knowledge of precautions;Pain PT Treatment Interventions: DME instruction;Gait training;Stair training;Functional mobility training;Therapeutic activities;Therapeutic exercise;Neuromuscular re-education;Patient/family education     PT Goals(Current goals can be found in the care plan section) Acute Rehab PT Goals Patient Stated Goal: To return to PLOF PT Goal Formulation: With patient/family Time For Goal Achievement: 03/23/13 Potential to Achieve Goals: Good  Visit Information  Last PT Received On: 03/16/13 Assistance Needed: +2 History of Present Illness: Pt is an 79 y/o female admitted s/p I&D of infected R TKA. Components were removed and antibiotic spacer was inserted. Pt is now PWB 50% on the right.        Prior Functioning  Home Living Family/patient expects to be discharged to:: Skilled nursing facility [facility]) Living Arrangements: Alone Prior Function Level of Independence: Independent Communication Communication: No difficulties Dominant Hand: Right    Cognition  Cognition Arousal/Alertness: Lethargic Behavior During Therapy: WFL for tasks assessed/performed Overall Cognitive Status: Within  Functional Limits for tasks assessed    Extremity/Trunk Assessment  Upper Extremity Assessment Upper Extremity Assessment: Overall WFL for tasks assessed Lower Extremity Assessment Lower Extremity Assessment: RLE deficits/detail RLE Deficits / Details: Decreased strength and AROM consistent with above mentioned procedure. RLE: Unable to fully assess due to pain Cervical / Trunk Assessment Cervical / Trunk Assessment: Kyphotic   Balance Balance Balance Assessed: Yes Static Sitting Balance Static Sitting - Balance Support: Feet supported;Bilateral upper extremity supported Static Sitting - Level of Assistance: 5: Stand by assistance;4: Min assist Static Sitting - Comment/# of Minutes: Min A at times while sitting EOB Static Standing Balance Static Standing - Balance Support: Bilateral upper extremity supported Static Standing - Level of Assistance: 4: Min assist  End of Session PT - End of Session Equipment Utilized During Treatment: Gait belt;Right knee immobilizer Activity Tolerance: Patient limited by pain;Patient limited by lethargy Patient left: in chair;with call bell/phone within reach;with family/visitor present Nurse Communication: Mobility status;Patient requests pain meds  GP     Ruthann Cancer 03/16/2013, 12:08 PM  Ruthann Cancer, PT, DPT [phone]

## 2013-03-16 NOTE — Progress Notes (Signed)
Patient ID: [name], female   DOB: [birth year], 79 y.o.   MRN: [id number] PATIENT ID: [name]        MRN:  [id number]          DOB/AGE: 1925-10-12 / 79 y.o.    [name], MD   Jacqualine Code, PA-C [address] [place] [address], [place]  [address]                             [phone]   PROGRESS NOTE  Subjective:  negative for Chest Pain  negative for Shortness of Breath  negative for Nausea/Vomiting   negative for Calf Pain    Tolerating Diet: yes         Patient reports pain as mild and moderate.     Difficulty voiding.  Objective: Vital signs in last 24 hours:   Patient Vitals for the past 24 hrs:  BP Temp Pulse Resp SpO2  03/16/13 1400 163/52 mmHg 98.1 F (36.7 C) 61 15 95 %  03/16/13 0400 174/51 mmHg 97.7 F (36.5 C) 66 18 98 %  03/16/13 0305 178/65 mmHg 98 F (36.7 C) 64 17 92 %  03/16/13 0207 160/59 mmHg 98.8 F (37.1 C) 59 17 99 %  03/16/13 0109 167/55 mmHg 98.1 F (36.7 C) 57 16 98 %  03/15/13 2349 172/59 mmHg 97.1 F (36.2 C) 88 - 98 %  03/15/13 2330 162/63 mmHg 98.9 F (37.2 C) 67 15 98 %  03/15/13 2315 174/60 mmHg - 71 15 100 %  03/15/13 2307 156/58 mmHg 98.8 F (37.1 C) 69 17 100 %      Intake/Output from previous day:   12/17 0701 - 12/18 0700 In: 2775 [I.V.:2375] Out: 2280 [Urine:2180]   Intake/Output this shift:       Intake/Output     12/17 0701 - 12/18 0700 12/18 0701 - 12/19 0700   I.V. 2375    Other 300    IV Piggyback 100    Total Intake 2775     Urine 2180    Blood 100    Total Output 2280     Net +495             LABORATORY DATA:  Recent Labs  03/15/13 1820 03/16/13 0509  WBC 10.4 12.5*  HGB 14.5 13.5  HCT 40.9 38.6  PLT 201 207    Recent Labs  03/15/13 1820 03/16/13 0509  NA 136 136  K 3.8 4.1  CL 101 103  CO2 26 24  BUN 14 12  CREATININE 1.03 1.00  GLUCOSE 101* 165*  CALCIUM 8.8 8.3*   Lab Results  Component Value Date   INR 1.01 03/15/2013   INR 1.16 03/08/2012   INR 0.93 01/10/2010     Recent Radiographic Studies :  Chest 2 View  03/15/2013   CLINICAL DATA:  Preoperative examination  EXAM: CHEST  2 VIEW  COMPARISON:  03/08/2012; 01/10/2010; 09/04/2003  FINDINGS: Grossly unchanged borderline enlarged cardiac silhouette and mediastinal contours. Evaluation of retrosternal scar space is obscured secondary to overlying soft tissues. No focal airspace opacities. No pleural effusion or pneumothorax. No evidence of edema. Post left-sided mastectomy. Surgical clips overlie the left axilla. Grossly unchanged bones including high-riding right humeral head suggestive of rotator cuff pathology.  IMPRESSION: No acute cardiopulmonary disease.   Electronically Signed   By: Simonne Come M.D.   On: 03/15/2013 18:19     Examination:  General appearance: alert, cooperative,  mild distress and moderate distress  Wound Exam: clean, dry, intact dressing  Motor Exam: EHL, FHL, Anterior Tibial and Posterior Tibial Intact  Sensory Exam: Superficial Peroneal, Deep Peroneal and Tibial normal  Vascular Exam: Right dorsalis pedis artery has trace pulse  Assessment:    1 Day Post-Op  Procedure(s) (LRB): Exploration right total knee replacement , removal of components, and insertion of antibiotic spacer. (Right)  ADDITIONAL DIAGNOSIS:  Principal Problem:   Infection of total knee replacement Active Problems:   Infection of total joint prosthesis     Plan: Physical Therapy as ordered Partial Weight Bearing @ 50.% (PWB)  DVT Prophylaxis:  Xarelto, Foot Pumps and TED hose  DISCHARGE PLAN: Skilled Nursing Facility/Rehab  DISCHARGE NEEDS: HHPT, HHRN, Walker, 3-in-1 comode seat and IV Antibiotics  Straight cathed.  Bladder scanned 370 ml.  Will place foley if further problems         [facility] 03/16/2013 6:11 PM

## 2013-03-17 DIAGNOSIS — D649 Anemia, unspecified: Secondary | ICD-10-CM

## 2013-03-17 DIAGNOSIS — A4901 Methicillin susceptible Staphylococcus aureus infection, unspecified site: Secondary | ICD-10-CM

## 2013-03-17 DIAGNOSIS — I10 Essential (primary) hypertension: Secondary | ICD-10-CM

## 2013-03-17 DIAGNOSIS — R339 Retention of urine, unspecified: Secondary | ICD-10-CM | POA: Clinically undetermined

## 2013-03-17 DIAGNOSIS — D509 Iron deficiency anemia, unspecified: Secondary | ICD-10-CM | POA: Clinically undetermined

## 2013-03-17 LAB — CBC
HCT: 31.2 % — ABNORMAL LOW (ref 36.0–46.0)
Hemoglobin: 10.6 g/dL — ABNORMAL LOW (ref 12.0–15.0)
MCH: 30.9 pg (ref 26.0–34.0)
MCV: 91 fL (ref 78.0–100.0)
Platelets: 182 10*3/uL (ref 150–400)
RBC: 3.43 MIL/uL — ABNORMAL LOW (ref 3.87–5.11)
WBC: 11.9 10*3/uL — ABNORMAL HIGH (ref 4.0–10.5)

## 2013-03-17 LAB — URINE MICROSCOPIC-ADD ON

## 2013-03-17 LAB — URINE CULTURE
Colony Count: NO GROWTH
Culture: NO GROWTH

## 2013-03-17 LAB — BASIC METABOLIC PANEL
BUN: 15 mg/dL (ref 6–23)
CO2: 26 mEq/L (ref 19–32)
Calcium: 7.4 mg/dL — ABNORMAL LOW (ref 8.4–10.5)
Chloride: 101 mEq/L (ref 96–112)
Creatinine, Ser: 1.21 mg/dL — ABNORMAL HIGH (ref 0.50–1.10)
Glucose, Bld: 145 mg/dL — ABNORMAL HIGH (ref 70–99)

## 2013-03-17 LAB — URINALYSIS, ROUTINE W REFLEX MICROSCOPIC
Bilirubin Urine: NEGATIVE
Glucose, UA: NEGATIVE mg/dL
Ketones, ur: 15 mg/dL — AB
Protein, ur: NEGATIVE mg/dL
Specific Gravity, Urine: 1.026 (ref 1.005–1.030)

## 2013-03-17 MED ORDER — AMLODIPINE BESYLATE 5 MG PO TABS
5.0000 mg | ORAL_TABLET | Freq: Two times a day (BID) | ORAL | Status: DC
  Administered 2013-03-17 – 2013-03-18 (×3): 5 mg via ORAL
  Filled 2013-03-17 (×5): qty 1

## 2013-03-17 NOTE — Discharge Summary (Signed)
[name], MD   Jacqualine Code, PA-C [address] [place], [place]  [address]                             [phone]  PATIENT ID: [name]        MRN:  [id number]          DOB/AGE: 77/22/1927 / 79 y.o.    DISCHARGE SUMMARY  ADMISSION DATE:    03/15/2013 DISCHARGE DATE:   03/20/2013   ADMISSION DIAGNOSIS: reinfected right total knee replacement Infected Right Knee Prosthesis    DISCHARGE DIAGNOSIS:  Infected Right Knee Prosthesis    ADDITIONAL DIAGNOSIS: Principal Problem:   Infection of total knee replacement Active Problems:   Infection of total joint prosthesis   HTN (hypertension)   Iron deficiency anemia   HTN (hypertension), malignant   Urinary retention  Past Medical History  Diagnosis Date  . Hypertension   . Thyroid disease   . Hiatal hernia   . Diverticulosis   . Complication of anesthesia   . PONV (postoperative nausea and vomiting)   . Hypothyroidism   . High cholesterol   . Chronic bronchitis     "yearly for the last 3-4 years" (03/16/2013)  . GERD (gastroesophageal reflux disease)   . Arthritis     "joints" (03/16/2013)  . Breast cancer   . Uterine cancer     PROCEDURE: Procedure(s): Exploration right total knee replacement , removal of components, and insertion of antibiotic spacer. Right on 03/15/2013  CONSULTS: [facility]      HISTORY: [name] is a very pleasant 79 year old white female who was noted to have a pain in her right knee. She had total knee replacement back in October of 2009 by Dr. Erasmo Leventhal. She had some problems with motion with this at that time. She did have a manipulation for arthrofibrosis on 05/28/2010. At the time of her surgery, she did have a popliteal artery injury and was evaluated by Dr. Imogene Burn, and Dr. Imogene Burn and Dr. Myra Gianotti did a bovine patch graft to this area. Because of her arthrofibrosis from the surgery, she had to be manipulated. She states she has never gone past 80 degrees of motion. She  states she was tolerating her pain fairly well; however, on Sunday, 02/28/2012, she was visiting a friend in the hospital, stood up, and had pain in the posterior and lateral aspects of the knee. She had exploration of her right total knee replacement with irrigation, debridement, synovectomy and poly exchange on 03/08/2012 . She had a staph coagulase-negative infection that was determined preoperatively. Her cultures postop were negative despite not receiving any antibiotics. She is being followed by the infectious disease team and she is personally on cephalexin 2 grams every 8 hours and rifampin 300 mg twice a day. Underwent manipulation of her right total knee replacement on 06/02/2012. The family did have her lab values from her July 24 office visit. Her sed rate was 10, her C-reactive protein was 0.8, so I think that is a very, very good indicator of lack of infection. She has done well till recently. Began having temp over 100 and painful swollen right knee. Saw Dr Cleophas Dunker in [place] today with aspiration of purulent material. Admitted for I&D with removal of prosthesis and placement of antibiotic spacer.   HOSPITAL COURSE:  [name] is a 79 y.o. admitted on 03/15/2013 and found to have a diagnosis of Infected Right Knee Prosthesis.  After  appropriate laboratory studies were obtained  they were taken to the operating room on 03/15/2013 and underwent  Procedure(s): Exploration right total knee replacement , removal of components, and insertion of antibiotic spacer.  Right.   They were given perioperative antibiotics:  Anti-infectives   Start     Dose/Rate Route Frequency Ordered Stop   03/20/13 0000  ceFAZolin (ANCEF) 2-3 GM-% SOLR     2 g 100 mL/hr over 30 Minutes Intravenous Every 8 hours 03/20/13 1208     03/16/13 1400  ceFAZolin (ANCEF) IVPB 2 g/50 mL premix    Comments:  PATIENT HAS HAD INFECTED TKR REQUIRING LONGER ANTIBX   2 g 100 mL/hr over 30 Minutes Intravenous 3 times per day  03/16/13 1131     03/16/13 0100  ceFAZolin (ANCEF) IVPB 2 g/50 mL premix  Status:  Discontinued    Comments:  PATIENT HAS HAD INFECTED TKR REQUIRING LONGER ANTIBX   2 g 100 mL/hr over 30 Minutes Intravenous Every 6 hours 03/16/13 0000 03/16/13 1131   03/15/13 2015  ceFAZolin (ANCEF) powder 2 g     2 g Other To Surgery 03/15/13 2011 03/15/13 2158    .  Tolerated the procedure well.  Placed with a foley intraoperatively.     Toradol was given post op.  POD #1, allowed out of bed to a chair.  PT for ambulation and exercise program.  Foley D/C'd in morning.  IV saline locked.  O2 discontionued. Hemovac pulled.  POD #2, continued PT and ambulation.    Had elevation in BP and BUN/Creatinine which improved.  PICC line placed. Had problems with urination requiring recathed  but this has improved and foley d/c'd.     The remainder of the hospital course was dedicated to ambulation and strengthening and waiting for SNF acceptance.   The patient was discharged on 5 Days Post-Op in  Stable condition.  Blood products given:none  DIAGNOSTIC STUDIES: Recent vital signs:  Patient Vitals for the past 24 hrs:  BP Temp Temp src Pulse Resp SpO2  03/20/13 0500 159/58 mmHg 98.2 F (36.8 C) Oral 80 20 97 %  03/19/13 2207 168/59 mmHg - - - - -  03/19/13 2100 185/60 mmHg 99.2 F (37.3 C) Oral 79 20 95 %  03/19/13 1316 152/54 mmHg 99.2 F (37.3 C) - 76 18 93 %       Recent laboratory studies:  Recent Labs  03/15/13 1820 03/16/13 0509 03/17/13 0550 03/18/13 0519  WBC 10.4 12.5* 11.9* 10.0  HGB 14.5 13.5 10.6* 9.7*  HCT 40.9 38.6 31.2* 29.1*  PLT 201 207 182 154    Recent Labs  03/15/13 1820 03/16/13 0509 03/17/13 0550 03/18/13 0519 03/20/13 0500  NA 136 136 135 136 136  K 3.8 4.1 3.9 4.1 3.6  CL 101 103 101 101 97  CO2 26 24 26 29  33*  BUN 14 12 15 13 11   CREATININE 1.03 1.00 1.21* 1.11* 0.93  GLUCOSE 101* 165* 145* 127* 128*  CALCIUM 8.8 8.3* 7.4* 7.8* 8.4   Lab Results    Component Value Date   INR 1.01 03/15/2013   INR 1.16 03/08/2012   INR 0.93 01/10/2010     Recent Radiographic Studies :  Chest 2 View  03/15/2013   CLINICAL DATA:  Preoperative examination  EXAM: CHEST  2 VIEW  COMPARISON:  03/08/2012; 01/10/2010; 09/04/2003  FINDINGS: Grossly unchanged borderline enlarged cardiac silhouette and mediastinal contours. Evaluation of retrosternal scar space is obscured secondary to overlying soft tissues.  No focal airspace opacities. No pleural effusion or pneumothorax. No evidence of edema. Post left-sided mastectomy. Surgical clips overlie the left axilla. Grossly unchanged bones including high-riding right humeral head suggestive of rotator cuff pathology.  IMPRESSION: No acute cardiopulmonary disease.   Electronically Signed   By: Simonne Come M.D.   On: 03/15/2013 18:19    DISCHARGE INSTRUCTIONS:     Discharge Orders   Future Appointments Provider Department Dept Phone   04/07/2013 2:00 PM [facility] [place] [facility] [address] [phone]   04/07/2013 2:30 PM [facility] [place] [facility] [address] [phone]   04/07/2013 3:00 PM Carma Lair Nickel, NP [facility] [phone]   08/09/2013 1:45 PM Randall Hiss, MD [facility] [phone]   Future Orders Complete By Expires   Call MD / Call 911  As directed    Comments:     If you experience chest pain or shortness of breath, CALL 911 and be transported to the hospital emergency room.  If you develope a fever above 101 F, pus (white drainage) or increased drainage or redness at the wound, or calf pain, call your surgeon's office.   Change dressing  As directed    Comments:     Change dressing on Thursday, then change the dressing daily with sterile 4 x 4 inch gauze dressing and apply TED hose.  You may clean the incision with alcohol prior to redressing.   Constipation Prevention  As directed    Comments:      Drink plenty of fluids.  Prune juice and/or coffee may be helpful.  You may use a stool softener, such as Colace (over the counter) 100 mg twice a day.  Use MiraLax (over the counter) for constipation as needed but this may take several days to work.  Mag Citrate --OR-- Milk of Magnesia --OR -- Dulcolax pills/suppositories may also be used but follow directions on the label.   Diet - low sodium heart healthy  As directed    Discharge instructions  As directed    Comments:     YOU WERE GIVEN A DEVICE CALLED AN INCENTIVE SPIROMETER TO HELP YOU TAKE DEEP BREATHS.  PLEASE USE THIS AT LEAST TEN (10) TIMES EVERY 1-2 HOURS EVERY DAY TO PREVENT PNEUMONIA. PT FOR AMBULATION 50% WEIGHT BEARING ON RIGHT LEG.  MAY DO GENTLE ROM 0- 30 DEGREES.  KNEE IMMOBILIZER WHEN UP AND OUT OF BED.   MAY REMOVE KNEE IMMOBILIZER IN BED FOR COMFORT. ICE TO RIGHT KNEE AS NEEDED   Do not put a pillow under the knee. Place it under the heel.  As directed    Driving restrictions  As directed    Comments:     No driving for 6 weeks   Increase activity slowly as tolerated  As directed    Lifting restrictions  As directed    Comments:     No lifting for 6 weeks   Partial weight bearing  As directed    Comments:     50 % WEIGHT BEARING AS TAUGHT IN PHYSICAL THERAPY   Questions:     % Body Weight:     Laterality:     Extremity:     Patient may shower  As directed    Comments:     You may shower over the brown dressing.  Once the dressing is removed you may shower without a dressing once there is no drainage.  Do  not wash over the wound.  If drainage remains, cover wound with plastic wrap and then shower.   TED hose  As directed    Comments:     Use stockings (TED hose) for 2 weeks on operative leg(s).  You may remove them at night for sleeping.   Weight bearing as tolerated  As directed    Questions:     Laterality:     Extremity:        DISCHARGE MEDICATIONS:     Medication List    STOP taking these  medications       traMADol 50 MG tablet  Commonly known as:  ULTRAM      TAKE these medications       acetaminophen 325 MG tablet  Commonly known as:  TYLENOL  Take 2 tablets (650 mg total) by mouth every 6 (six) hours as needed for mild pain (or Fever >/= 101).     amLODipine 10 MG tablet  Commonly known as:  NORVASC  Take 1 tablet (10 mg total) by mouth daily.     aspirin 81 MG tablet  Take 81 mg by mouth daily.     bisacodyl 10 MG suppository  Commonly known as:  DULCOLAX  Place 1 suppository (10 mg total) rectally daily as needed for moderate constipation.     ceFAZolin 2-3 GM-% Solr  Commonly known as:  ANCEF  Inject 50 mLs (2 g total) into the vein every 8 (eight) hours.     docusate sodium 100 MG capsule  Commonly known as:  COLACE  Take 100 mg by mouth 2 (two) times daily.     ergocalciferol 50000 UNITS capsule  Commonly known as:  VITAMIN D2  Take 50,000 Units by mouth every 30 (thirty) days. Take on the 1st day of each month.     ferrous sulfate 325 (65 FE) MG tablet  Take 1 tablet (325 mg total) by mouth 3 (three) times daily with meals.     hydrALAZINE 50 MG tablet  Commonly known as:  APRESOLINE  Take 1 tablet (50 mg total) by mouth every 8 (eight) hours.     indapamide 1.25 MG tablet  Commonly known as:  LOZOL  Take 1 tablet (1.25 mg total) by mouth daily.     levothyroxine 50 MCG tablet  Commonly known as:  SYNTHROID, LEVOTHROID  Take 50 mcg by mouth daily.     methocarbamol 500 MG tablet  Commonly known as:  ROBAXIN  Take 500 mg by mouth daily as needed. For cramps.     oxyCODONE 5 MG immediate release tablet  Commonly known as:  Oxy IR/ROXICODONE  Take 1-2 tablets (5-10 mg total) by mouth every 3 (three) hours as needed for breakthrough pain.     polyethylene glycol packet  Commonly known as:  MIRALAX / GLYCOLAX  Take 17 g by mouth daily.     pravastatin 40 MG tablet  Commonly known as:  PRAVACHOL  Take 40 mg by mouth daily.      prednisoLONE acetate 1 % ophthalmic suspension  Commonly known as:  PRED FORTE  Place 1 drop into the left eye daily.     rivaroxaban 10 MG Tabs tablet  Commonly known as:  XARELTO  Take 1 tablet (10 mg total) by mouth daily.        FOLLOW UP VISIT:   Follow-up Information   Follow up with FARRAH,VICTOR B, DO. Schedule an appointment as soon as possible for a visit in 1  week. (f/u in 1-2 weeks)    Specialty:  Family Medicine   Contact information:   [address] [place] [address] [phone]       Follow up with [facility], Claude Manges, MD. Schedule an appointment as soon as possible for a visit on 03/31/2013.   Specialty:  Orthopedic Surgery   Contact information:   [address] [place] [address] [phone]       DISPOSITION:   Skilled Nursing Facility/Rehab  CONDITION:  Stable   PETRARCA,BRIAN 03/20/2013, 12:10 PM

## 2013-03-17 NOTE — Progress Notes (Signed)
INFECTIOUS DISEASE PROGRESS NOTE  ID: [name] is a 79 y.o. female with  Principal Problem:   Infection of total knee replacement Active Problems:   Infection of total joint prosthesis  Subjective: Without complaints- no CP, SOB or headache. family worried about her HTN  Abtx:  Anti-infectives   Start     Dose/Rate Route Frequency Ordered Stop   03/16/13 1400  ceFAZolin (ANCEF) IVPB 2 g/50 mL premix    Comments:  PATIENT HAS HAD INFECTED TKR REQUIRING LONGER ANTIBX   2 g 100 mL/hr over 30 Minutes Intravenous 3 times per day 03/16/13 1131     03/16/13 0100  ceFAZolin (ANCEF) IVPB 2 g/50 mL premix  Status:  Discontinued    Comments:  PATIENT HAS HAD INFECTED TKR REQUIRING LONGER ANTIBX   2 g 100 mL/hr over 30 Minutes Intravenous Every 6 hours 03/16/13 0000 03/16/13 1131   03/15/13 2015  ceFAZolin (ANCEF) powder 2 g     2 g Other To Surgery 03/15/13 2011 03/15/13 2158      Medications:  Scheduled: . amLODipine  5 mg Oral Daily  .  ceFAZolin (ANCEF) IV  2 g Intravenous Q8H  . docusate sodium  100 mg Oral BID  . indapamide  1.25 mg Oral Daily  . levothyroxine  50 mcg Oral QAC breakfast  . polyethylene glycol  17 g Oral Daily  . prednisoLONE acetate  1 drop Left Eye q morning - 10a  . rivaroxaban  10 mg Oral Daily  . simvastatin  20 mg Oral q1800    Objective: Vital signs in last 24 hours: Temp:  [98.1 F (36.7 C)-98.9 F (37.2 C)] 98.9 F (37.2 C) (12/19 0630) Pulse Rate:  [61-85] 85 (12/19 0630) Resp:  [15-18] 16 (12/19 0752) BP: (160-200)/(51-60) 168/56 mmHg (12/19 0630) SpO2:  [95 %-100 %] 100 % (12/19 0752) Weight:  [80.74 kg (178 lb)] 80.74 kg (178 lb) (12/19 0900)   General appearance: alert, cooperative and no distress Extremities: RLE wrapped.  Lab Results  Recent Labs  03/16/13 0509 03/17/13 0550  WBC 12.5* 11.9*  HGB 13.5 10.6*  HCT 38.6 31.2*  NA 136 135  K 4.1 3.9  CL 103 101  CO2 24 26  BUN 12 15  CREATININE 1.00 1.21*   Liver  Panel  Recent Labs  03/15/13 1820  PROT 7.2  ALBUMIN 3.7  AST 16  ALT 11  ALKPHOS 104  BILITOT 0.5   Sedimentation Rate  Recent Labs  03/16/13 0509  ESRSEDRATE 50*   C-Reactive Protein  Recent Labs  03/16/13 0509  CRP 10.9*    Microbiology: Recent Results (from the past 240 hour(s))  URINE CULTURE     Status: None   Collection Time    03/15/13  5:28 PM      Result Value Range Status   Specimen Description URINE, CLEAN CATCH   Final   Special Requests NONE   Final   Culture  Setup Time     Final   Value: 03/15/2013 18:27     Performed at [facility] Count     Final   Value: NO GROWTH     Performed at [facility]   Culture     Final   Value: NO GROWTH     Performed at [facility]   Report Status 03/17/2013 FINAL   Final  MRSA PCR SCREENING     Status: None   Collection Time    03/15/13  5:30 PM      Result Value Range Status   MRSA by PCR NEGATIVE  NEGATIVE Final   Comment:            The GeneXpert MRSA Assay (FDA     approved for NASAL specimens     only), is one component of a     comprehensive MRSA colonization     surveillance program. It is not     intended to diagnose MRSA     infection nor to guide or     monitor treatment for     MRSA infections.  ANAEROBIC CULTURE     Status: None   Collection Time    03/15/13  9:00 PM      Result Value Range Status   Specimen Description WOUND KNEE RIGHT   Final   Special Requests NONE   Final   Gram Stain     Final   Value: FEW WBC PRESENT,BOTH PMN AND MONONUCLEAR     NO SQUAMOUS EPITHELIAL CELLS SEEN     NO ORGANISMS SEEN     Performed at [facility]   Culture     Final   Value: NO ANAEROBES ISOLATED; CULTURE IN PROGRESS FOR 5 DAYS     Performed at [facility]   Report Status PENDING   Incomplete  AFB CULTURE WITH SMEAR     Status: None   Collection Time    03/15/13  9:00 PM      Result Value Range Status   Specimen Description WOUND RIGHT  KNEE   Final   Special Requests NONE   Final   ACID FAST SMEAR     Final   Value: NO ACID FAST BACILLI SEEN     Performed at [facility]   Culture     Final   Value: CULTURE WILL BE EXAMINED FOR 6 WEEKS BEFORE ISSUING A FINAL REPORT     Performed at [facility]   Report Status PENDING   Incomplete  WOUND CULTURE     Status: None   Collection Time    03/15/13  9:00 PM      Result Value Range Status   Specimen Description WOUND RIGHT KNEE   Final   Special Requests NONE   Final   Gram Stain     Final   Value: FEW WBC PRESENT,BOTH PMN AND MONONUCLEAR     NO SQUAMOUS EPITHELIAL CELLS SEEN     NO ORGANISMS SEEN     Performed at [facility]   Culture     Final   Value: NO GROWTH     Performed at [facility]   Report Status PENDING   Incomplete    Studies/Results: Chest 2 View  03/15/2013   CLINICAL DATA:  Preoperative examination  EXAM: CHEST  2 VIEW  COMPARISON:  03/08/2012; 01/10/2010; 09/04/2003  FINDINGS: Grossly unchanged borderline enlarged cardiac silhouette and mediastinal contours. Evaluation of retrosternal scar space is obscured secondary to overlying soft tissues. No focal airspace opacities. No pleural effusion or pneumothorax. No evidence of edema. Post left-sided mastectomy. Surgical clips overlie the left axilla. Grossly unchanged bones including high-riding right humeral head suggestive of rotator cuff pathology.  IMPRESSION: No acute cardiopulmonary disease.   Electronically Signed   By: Simonne Come M.D.   On: 03/15/2013 18:19     Assessment/Plan: TKR infection  resected 12/17 [facility] (grown [facility] [phone]) HTN, uncontrolled  Total days of antibiotics: 2  ancef  Will continue her current anbx F/u her Cx.  Plan for long term anbx again.  Appreciate hospitalist help with BP mgmt.          [name] Infectious Diseases (pager) [phone] [email or web address] 03/17/2013, 9:40 AM  LOS: 2 days

## 2013-03-17 NOTE — Progress Notes (Signed)
[facility] CONSULT NOTE/PROGRESS NOTE  [name] ZHY:[id number] DOB: 07-31-1925 DOA: 03/15/2013 PCP: Donata Duff, DO  Assessment/Plan: #1 malignant hypertension Likely with a component of pain. Patient was resumed back on indapamide which was on in the past. Patient's Norvasc was resumed at 5 mg daily. Will increase Norvasc to 5 mg twice a day. Follow. Will likely need outpatient followup.  #2 infection of total knee replacement Patient is status post exploration of right total knee replacement with extensive synovectomy, cultures, removal of components and insertion of antibiotic spacer. Continue IV Ancef. Cultures pending. Per primary team. ID following.  #3 urinary retention Continue Foley catheter. Will likely need a voiding trial in a few days. If no improvement will likely need to followup with urology as outpatient.  #4 anemia Likely secondary to postop blood loss. Patient with no overt GI bleed. Check an anemia panel. Follow H&H. Per primary team.  Code Status: Full Family Communication: Updated patient and daughter bedside. Disposition Plan: Per primary team   Consultants:  [facility] 03/16/2013  Infectious disease Dr. Ninetta Lights 03/16/2013  Procedures:  Status post exploration of right total knee replacement with extensive side effect to me, cultures, removal of components and insertion of antibiotic spacer 03/15/2013 per Dr. Cleophas Dunker  Antibiotics:  IV Ancef 03/16/2013  HPI/Subjective: Patient with no complaints.  Objective: Filed Vitals:   03/17/13 1535  BP:   Pulse:   Temp:   Resp: 18    Intake/Output Summary (Last 24 hours) at 03/17/13 1949 Last data filed at 03/17/13 1746  Gross per 24 hour  Intake    290 ml  Output   1500 ml  Net  -1210 ml   Filed Weights   03/17/13 0900  Weight: 80.74 kg (178 lb)    Exam:   General:  NAD  Cardiovascular: RRR  Respiratory: CTAB  Abdomen: Soft, nontender, nondistended, positive  bowel sounds  Musculoskeletal: No clubbing cyanosis or edema  Data Reviewed: Basic Metabolic Panel:  Recent Labs Lab 03/15/13 1820 03/16/13 0509 03/17/13 0550  NA 136 136 135  K 3.8 4.1 3.9  CL 101 103 101  CO2 26 24 26   GLUCOSE 101* 165* 145*  BUN 14 12 15   CREATININE 1.03 1.00 1.21*  CALCIUM 8.8 8.3* 7.4*   Liver Function Tests:  Recent Labs Lab 03/15/13 1820  AST 16  ALT 11  ALKPHOS 104  BILITOT 0.5  PROT 7.2  ALBUMIN 3.7   No results found for this basename: LIPASE, AMYLASE,  in the last 168 hours No results found for this basename: AMMONIA,  in the last 168 hours CBC:  Recent Labs Lab 03/15/13 1820 03/16/13 0509 03/17/13 0550  WBC 10.4 12.5* 11.9*  NEUTROABS 8.4*  --   --   HGB 14.5 13.5 10.6*  HCT 40.9 38.6 31.2*  MCV 88.7 89.6 91.0  PLT 201 207 182   Cardiac Enzymes: No results found for this basename: CKTOTAL, CKMB, CKMBINDEX, TROPONINI,  in the last 168 hours BNP (last 3 results) No results found for this basename: PROBNP,  in the last 8760 hours CBG: No results found for this basename: GLUCAP,  in the last 168 hours  Recent Results (from the past 240 hour(s))  URINE CULTURE     Status: None   Collection Time    03/15/13  5:28 PM      Result Value Range Status   Specimen Description URINE, CLEAN CATCH   Final   Special Requests NONE   Final   Culture  Setup Time     Final   Value: 03/15/2013 18:27     Performed at [facility] Count     Final   Value: NO GROWTH     Performed at [facility]   Culture     Final   Value: NO GROWTH     Performed at [facility]   Report Status 03/17/2013 FINAL   Final  MRSA PCR SCREENING     Status: None   Collection Time    03/15/13  5:30 PM      Result Value Range Status   MRSA by PCR NEGATIVE  NEGATIVE Final   Comment:            The GeneXpert MRSA Assay (FDA     approved for NASAL specimens     only), is one component of a     comprehensive MRSA colonization      surveillance program. It is not     intended to diagnose MRSA     infection nor to guide or     monitor treatment for     MRSA infections.  ANAEROBIC CULTURE     Status: None   Collection Time    03/15/13  9:00 PM      Result Value Range Status   Specimen Description WOUND KNEE RIGHT   Final   Special Requests NONE   Final   Gram Stain     Final   Value: FEW WBC PRESENT,BOTH PMN AND MONONUCLEAR     NO SQUAMOUS EPITHELIAL CELLS SEEN     NO ORGANISMS SEEN     Performed at [facility]   Culture     Final   Value: NO ANAEROBES ISOLATED; CULTURE IN PROGRESS FOR 5 DAYS     Performed at [facility]   Report Status PENDING   Incomplete  AFB CULTURE WITH SMEAR     Status: None   Collection Time    03/15/13  9:00 PM      Result Value Range Status   Specimen Description WOUND RIGHT KNEE   Final   Special Requests NONE   Final   ACID FAST SMEAR     Final   Value: NO ACID FAST BACILLI SEEN     Performed at [facility]   Culture     Final   Value: CULTURE WILL BE EXAMINED FOR 6 WEEKS BEFORE ISSUING A FINAL REPORT     Performed at [facility]   Report Status PENDING   Incomplete  WOUND CULTURE     Status: None   Collection Time    03/15/13  9:00 PM      Result Value Range Status   Specimen Description WOUND RIGHT KNEE   Final   Special Requests NONE   Final   Gram Stain     Final   Value: FEW WBC PRESENT,BOTH PMN AND MONONUCLEAR     NO SQUAMOUS EPITHELIAL CELLS SEEN     NO ORGANISMS SEEN     Performed at [facility]   Culture     Final   Value: Culture reincubated for better growth     Performed at [facility]   Report Status PENDING   Incomplete     Studies: No results found.  Scheduled Meds: . amLODipine  5 mg Oral BID  .  ceFAZolin (ANCEF) IV  2 g Intravenous Q8H  . docusate sodium  100 mg  Oral BID  . indapamide  1.25 mg Oral Daily  . levothyroxine  50 mcg Oral QAC breakfast  . polyethylene glycol  17 g Oral  Daily  . prednisoLONE acetate  1 drop Left Eye q morning - 10a  . rivaroxaban  10 mg Oral Daily  . simvastatin  20 mg Oral q1800   Continuous Infusions:   Principal Problem:   Infection of total knee replacement Active Problems:   Infection of total joint prosthesis   HTN (hypertension)   Anemia   HTN (hypertension), malignant   Urinary retention    Time spent: 30 mins    [facility]  [facility] Pager [phone]. If 7PM-7AM, please contact night-coverage at [email or web address], password [facility] 03/17/2013, 7:49 PM  LOS: 2 days

## 2013-03-17 NOTE — Op Note (Signed)
NAMEPADME, ARRIAGA NO.:  [id number]  MEDICAL RECORD NO.:  [id number]  LOCATION:  5N12C                        FACILITY:  [facility]  PHYSICIAN:  Claude Manges. Ozie Dimaria, M.D.DATE OF BIRTH:  Nov 01, 1925  DATE OF PROCEDURE:  03/15/2013 DATE OF DISCHARGE:                              OPERATIVE REPORT   PREOPERATIVE DIAGNOSIS:  Infected right total knee replacement.  POSTOPERATIVE DIAGNOSIS:  Infected right total knee replacement.  PROCEDURE:  Exploration of right total knee replacement with extensive synovectomy, cultures, removal of components and insertion of antibiotic spacer.  SURGEON:  Claude Manges. Cleophas Dunker, MD  ASSISTANT:  Oris Drone. Petrarca, PA-C  ANESTHESIA:  General.  COMPLICATIONS:  None.  INDICATIONS FOR PROCEDURE:  Ms. Huq was met in the holding area with her family, identified the right knee as the appropriate operative site. Earlier in the day, she was seen in the office with a swollen, painful right knee.  Her index knee replacement was performed in 2009 and one year ago, she developed an infection with coag-negative Staph, treated by the Infectious Disease Department initially with IV Ancef and then p.o. rifampin and Ancef.  She stopped the antibiotics approximately two months ago, and has done well with a normal sed rate and C-reactive protein until the last day or so, she has developed increased pain and swelling.  There has been no history of injury or trauma.  She has had some recent eye surgery.  I aspirated her knee in the office and she had 61,000 white cells.  The material appeared to be pus.  There were no bacteria seen on Gram stain nor were there any crystals identified.  She has evidence of recurrent infection of her right knee, and we planned to remove the components.  DESCRIPTION OF PROCEDURE:  Ms. Neuser was then taken to room #3 and placed under general anesthesia without difficulty.  Nursing staff inserted a Foley  catheter.  Tourniquet was applied to the right thigh.  The right leg was then prepped with chlorhexidine scrub and DuraPrep.  From the tourniquet to the toe, sterile draping was performed.  With the extremity elevated, was Esmarch exsanguinated with a proximal tourniquet at 350 mmHg.  The prior midline longitudinal incision was utilized and via sharp dissection, carried down to the subcutaneous tissue.  A medial parapatellar incision was then made through the deep capsule.  I did not encounter any old suture.  We did use PDS at closure a year ago.  The joint was entered.  There was considerably thickened synovium and capsular tissue.  We encountered purulence, I sent it for anaerobic and aerobic culture.  I had difficulty flexing the knee as she did develop a postoperative flexion contracture, but I did release the soft tissue medially and laterally in both gutters and then removed a moderate amount of the thickened tissue, and then I was able to flex the knee about 80 degrees.  I was able to bend to visualize the polyethylene component, used an osteotome to resect the stem and then remove it.  At that point, I could flex the knee about 80 degrees.  I proceeded to remove the femoral component.  Using the oscillating saw and  osteotomes, we removed it with very minimal loss of bone.  There was calyx beneath the components.  I then removed the tibial component in similar fashion. Using small osteotome, I removed any remaining methacrylate.  Neither of the components appeared to be loose nor did the metal-backed rotating patella which was also removed with a small osteotome without difficulty.  There was minimal bone loss.  There was quite a bit of synovitis circumferentially from the components and this was carefully removed and then I copiously irrigated the joint with saline.  Because of all the scar and the dissection, it was difficult, it took well over an hour just to remove the  components, but I felt that there was no compromise to any of the remaining structures.  LCL and MCL remained intact and was careful not to violate the posterior capsule.  I then inserted an antibiotic spacer.  We used the DePuy gentamicin methacrylate and mixed it with Ancef 1 g based on her prior cultures of coag-negative Staph.  We mixed the methacrylate, placed it in the tibia, and then used a 10-mm polyethylene bridging bearing as the articulating surface, and we maintained that in position until the methacrylate matured at about 16 minutes.  Any extraneous methacrylate was removed from the periphery.  We then made a femoral component using the DePuy mold.  We mixed the same gentamicin glue with 1 g of Ancef, and then applied the component against the femur and maintained its position until the methacrylate had hardened at approximately 16 minutes.  At that point, we reduced the components.  I had nice and perfect stability.  We lacked a few degrees to full extension.  She was able to flex about 30 degrees, had a nice articulating surface.  We again irrigated the joint.  We deflated the tourniquet at two hours.  Gross bleeders were Bovie coagulated.  The Hemovac was inserted.  We closed the deep capsule with interrupted #1 PDS and then the superficial capsule with a running 2-0 PDS.  Skin was closed with skin clips.  Sterile bulky dressing was applied followed by an Ace bandage with the patient's support stocking.  The patient tolerated the procedure without complications.     Claude Manges. Cleophas Dunker, M.D.     PWW/MEDQ  D:  03/15/2013  T:  03/16/2013  Job:  [id number]

## 2013-03-17 NOTE — Progress Notes (Signed)
Clinical Child psychotherapist (CSW) contacted [facility], who reported that the patient does not have a bed at their facility. CSW made daughter Gavin Pound and patient aware of above and discussed other options for skilled nursing facilities. Patient's other daughter is going to tour [facility] and make a decision about that facility. Case Manager met with patient and daughter Gavin Pound to discuss home health options. At this time home health is not an option because patient will have a PIC line and her daughters don't live in the area. CSW left a message with the [facility] admissions coordinator about bed offer. Signed [facility] is on shadow chart. CSW will continue to assist with D/C.    Jetta Lout, LCSWA Weekend CSW [phone]

## 2013-03-17 NOTE — Progress Notes (Signed)
Physical Therapy Treatment Patient Details Name: [name] MRN: [id number] DOB: Aug 08, 1925 Today's Date: 03/17/2013 Time: 7846-9629 PT Time Calculation (min): 18 min  PT Assessment / Plan / Recommendation  History of Present Illness Pt is an 79 y/o female admitted s/p I&D of infected R TKA. Components were removed and antibiotic spacer was inserted. Pt is now PWB 50% on the right.    PT Comments   Pt making slow progress towards physical therapy goals. Was able to ambulate 15 feet this session, with mod encouragement for distance. Pt and daughter state that she was up all night and didn't sleep, however pt agreed to sit in chair to eat breakfast prior to getting back in bed for a nap.  Follow Up Recommendations  SNF     Does the patient have the potential to tolerate intense rehabilitation     Barriers to Discharge        Equipment Recommendations  None recommended by PT    Recommendations for Other Services    Frequency Min 6X/week   Progress towards PT Goals Progress towards PT goals: Progressing toward goals  Plan Current plan remains appropriate    Precautions / Restrictions Precautions Precautions: Fall;Knee Required Braces or Orthoses: Knee Immobilizer - Right Knee Immobilizer - Right: Other (comment) (No order, but pt was wearing at all times throughout session) Restrictions Weight Bearing Restrictions: Yes RLE Weight Bearing: Partial weight bearing RLE Partial Weight Bearing Percentage or Pounds: 50   Pertinent Vitals/Pain 7/10 at rest    Mobility  Bed Mobility Bed Mobility: Supine to Sit;Sitting - Scoot to Edge of Bed Supine to Sit: 4: Min assist;HOB elevated;With rails Sitting - Scoot to Edge of Bed: 4: Min assist Details for Bed Mobility Assistance: VC's for sequencing and technique. Bed pad was used to assist pt to EOB. Transfers Transfers: Sit to Stand;Stand to Sit Sit to Stand: 4: Min assist;From bed;With upper extremity assist Stand to Sit: 4: Min  guard;To chair/3-in-1;With upper extremity assist Details for Transfer Assistance: VC's for hand placement and improved posture to come to full standing Ambulation/Gait Ambulation/Gait Assistance: 4: Min guard Ambulation Distance (Feet): 15 Feet Assistive device: Rolling walker Ambulation/Gait Assistance Details: Occasional min assist for walker movement as pt had poor awareness of obstacles, however overall pt required min guard assist during gait training. VC's for sequencing and safety awareness, as well as to maintain 50% PWB status on R. Gait Pattern: Step-to pattern Gait velocity: Decreased Stairs: No Wheelchair Mobility Wheelchair Mobility: No    Exercises     PT Diagnosis:    PT Problem List:   PT Treatment Interventions:     PT Goals (current goals can now be found in the care plan section) Acute Rehab PT Goals Patient Stated Goal: To return to PLOF PT Goal Formulation: With patient/family Time For Goal Achievement: 03/23/13 Potential to Achieve Goals: Good  Visit Information  Last PT Received On: 03/17/13 Assistance Needed: +2 History of Present Illness: Pt is an 79 y/o female admitted s/p I&D of infected R TKA. Components were removed and antibiotic spacer was inserted. Pt is now PWB 50% on the right.     Subjective Data  Subjective: "I feel tired and I hurt so much." Patient Stated Goal: To return to PLOF   Cognition  Cognition Arousal/Alertness: Lethargic Behavior During Therapy: WFL for tasks assessed/performed Overall Cognitive Status: Within Functional Limits for tasks assessed    Balance     End of Session PT - End of Session Equipment Utilized  During Treatment: Gait belt;Right knee immobilizer Activity Tolerance: Patient limited by pain;Patient limited by lethargy Patient left: in chair;with call bell/phone within reach;with family/visitor present Nurse Communication: Mobility status   GP     Ruthann Cancer 03/17/2013, 11:11 AM  Ruthann Cancer, PT, DPT [phone]

## 2013-03-17 NOTE — Progress Notes (Signed)
Patient ID: [name], female   DOB: 05-01-1925, 79 y.o.   MRN: [id number] PATIENT ID: [name]        MRN:  [id number]          DOB/AGE: [birth year] / 79 y.o.    Megan Campbell, MD   Jacqualine Code, PA-C [address] [place], [place]  [address]                             [phone]   PROGRESS NOTE  Subjective:  negative for Chest Pain  negative for Shortness of Breath  negative for Nausea/Vomiting   negative for Calf Pain    Tolerating Diet: yes         Patient reports pain as moderate.     Comfortable at rest in knee immobilizer  Objective: Vital signs in last 24 hours:   Patient Vitals for the past 24 hrs:  BP Temp Pulse Resp SpO2  03/17/13 0630 168/56 mmHg 98.9 F (37.2 C) 85 18 99 %  03/17/13 0400 - - - 17 -  03/17/13 0000 - - - 17 -  03/16/13 2300 160/51 mmHg 98.9 F (37.2 C) 82 18 99 %  03/16/13 2236 191/60 mmHg - - - -  03/16/13 2030 200/52 mmHg - - - -  03/16/13 2000 - - - 17 -  03/16/13 1400 163/52 mmHg 98.1 F (36.7 C) 61 15 95 %      Intake/Output from previous day:   12/18 0701 - 12/19 0700 In: 940 [P.O.:840] Out: 700 [Urine:650; Drains:50]   Intake/Output this shift:       Intake/Output     12/18 0701 - 12/19 0700 12/19 0701 - 12/20 0700   P.O. 840    I.V.     Other     IV Piggyback 100    Total Intake 940     Urine 650    Drains 50    Blood     Total Output 700     Net +240             LABORATORY DATA:  Recent Labs  03/15/13 1820 03/16/13 0509 03/17/13 0550  WBC 10.4 12.5* 11.9*  HGB 14.5 13.5 10.6*  HCT 40.9 38.6 31.2*  PLT 201 207 182    Recent Labs  03/15/13 1820 03/16/13 0509 03/17/13 0550  NA 136 136 135  K 3.8 4.1 3.9  CL 101 103 101  CO2 26 24 26   BUN 14 12 15   CREATININE 1.03 1.00 1.21*  GLUCOSE 101* 165* 145*  CALCIUM 8.8 8.3* 7.4*   Lab Results  Component Value Date   INR 1.01 03/15/2013   INR 1.16 03/08/2012   INR 0.93 01/10/2010    Recent Radiographic Studies :  Chest 2  View  03/15/2013   CLINICAL DATA:  Preoperative examination  EXAM: CHEST  2 VIEW  COMPARISON:  03/08/2012; 01/10/2010; 09/04/2003  FINDINGS: Grossly unchanged borderline enlarged cardiac silhouette and mediastinal contours. Evaluation of retrosternal scar space is obscured secondary to overlying soft tissues. No focal airspace opacities. No pleural effusion or pneumothorax. No evidence of edema. Post left-sided mastectomy. Surgical clips overlie the left axilla. Grossly unchanged bones including high-riding right humeral head suggestive of rotator cuff pathology.  IMPRESSION: No acute cardiopulmonary disease.   Electronically Signed   By: Simonne Come M.D.   On: 03/15/2013 18:19     Examination:  General appearance: alert, cooperative and no distress  Wound Exam: clean, dry, intact   Drainage:  None: wound tissue dry  Motor Exam: EHL, FHL, Anterior Tibial and Posterior Tibial intact  Sensory Exam: tingling in right foot probably related to tourniquet  Vascular Exam: feet warm, good cap refill  Assessment:    2 Days Post-Op  Procedure(s) (LRB): Exploration right total knee replacement , removal of components, and insertion of antibiotic spacer. (Right)  ADDITIONAL DIAGNOSIS:  Principal Problem:   Infection of total knee replacement Active Problems:   Infection of total joint prosthesis   Plan: Physical Therapy as ordered Partial Weight Bearing @ 50% (PWB)  DVT Prophylaxis:  Xarelto  DISCHARGE PLAN: Skilled Nursing Facility/Rehab  DISCHARGE NEEDS: has equipment     Dressing right knee replaced-wound clean, dry. Cultures at [facility] growing a coag neg staph most likely same as last year (?MSSA).Foley inserted with difficulty urinating,. Long discussion with daughter re plans for rehab at [facility] when bed available and ID and hospitalist service feel Ms Doscher is stable    Valeria Batman 03/17/2013 7:49 AM

## 2013-03-17 NOTE — Progress Notes (Signed)
Occupational Therapy Evaluation Patient Details Name: [name] MRN: [id number] DOB: 1925/10/27 Today's Date: 03/17/2013 Time: 4098-1191 OT Time Calculation (min): 23 min  OT Assessment / Plan / Recommendation History of present illness Pt is an 79 y/o female admitted s/p I&D of infected R TKA. Components were removed and antibiotic spacer was inserted. Pt is now PWB 50% on the right.    Clinical Impression   PT admitted with I& D Rt TKA. Pt currently with functional limitiations due to the deficits listed below (see OT problem list).  Pt will benefit from skilled OT to increase their independence and safety with adls and balance to allow discharge SNF.     OT Assessment  Patient needs continued OT Services    Follow Up Recommendations  SNF    Barriers to Discharge Decreased caregiver support    Equipment Recommendations  3 in 1 bedside comode;Wheelchair (measurements OT);Wheelchair cushion (measurements OT)    Recommendations for Other Services    Frequency  Min 2X/week    Precautions / Restrictions Precautions Precautions: Fall;Knee Required Braces or Orthoses: Knee Immobilizer - Right Knee Immobilizer - Right: On at all times Restrictions Weight Bearing Restrictions: Yes RLE Weight Bearing: Partial weight bearing RLE Partial Weight Bearing Percentage or Pounds: 50   Pertinent Vitals/Pain 4 out 10    ADL  Toilet Transfer: Moderate assistance Toilet Transfer Method: Sit to stand Toilet Transfer Equipment: Raised toilet seat with arms (or 3-in-1 over toilet) Toileting - Clothing Manipulation and Hygiene: Moderate assistance Where Assessed - Toileting Clothing Manipulation and Hygiene: Sit to stand from 3-in-1 or toilet Equipment Used: Gait belt;Knee Immobilizer;Rolling walker Transfers/Ambulation Related to ADLs: pt complete sit<>Stand Mod (A) from bed and Min (A) from 3n1 ADL Comments: Pt completing bed mobility slowing and needed (A) To progress. pt completing  toilet transfer with v/c to keep RT LE extended when sitting    OT Diagnosis: Generalized weakness;Acute pain  OT Problem List: Decreased strength;Decreased activity tolerance;Impaired balance (sitting and/or standing);Decreased safety awareness;Decreased knowledge of use of DME or AE;Decreased knowledge of precautions;Pain OT Treatment Interventions: Self-care/ADL training;Therapeutic exercise;DME and/or AE instruction;Therapeutic activities;Patient/family education;Balance training   OT Goals(Current goals can be found in the care plan section) Acute Rehab OT Goals Patient Stated Goal: to return home OT Goal Formulation: With patient Time For Goal Achievement: 03/31/13 Potential to Achieve Goals: Good  Visit Information  Last OT Received On: 03/17/13 Assistance Needed: +1 (+2 would be for chair pulling only- she can move MIN (A)) History of Present Illness: Pt is an 79 y/o female admitted s/p I&D of infected R TKA. Components were removed and antibiotic spacer was inserted. Pt is now PWB 50% on the right.        Prior Functioning     Home Living Family/patient expects to be discharged to:: Skilled nursing facility Living Arrangements: Alone Additional Comments: family requesting SNf due to lives alone Prior Function Level of Independence: Independent Communication Communication: No difficulties Dominant Hand: Right         Vision/Perception Vision - History Baseline Vision: No visual deficits Patient Visual Report: No change from baseline   Cognition  Cognition Arousal/Alertness: Lethargic Behavior During Therapy: WFL for tasks assessed/performed Overall Cognitive Status: Within Functional Limits for tasks assessed    Extremity/Trunk Assessment Upper Extremity Assessment Upper Extremity Assessment: Overall WFL for tasks assessed Lower Extremity Assessment Lower Extremity Assessment: Defer to PT evaluation Cervical / Trunk Assessment Cervical / Trunk Assessment:  Kyphotic     Mobility Bed Mobility Bed  Mobility: Supine to Sit;Sitting - Scoot to Edge of Bed;Sit to Supine Supine to Sit: 4: Min assist;HOB elevated;With rails Sitting - Scoot to Edge of Bed: 4: Min guard;With rail Sit to Supine: 3: Mod assist;HOB flat Details for Bed Mobility Assistance: (A) for bil Le back into bed Transfers Transfers: Sit to Stand;Stand to Sit Sit to Stand: 3: Mod assist;With upper extremity assist;From bed Stand to Sit: 4: Min assist;With upper extremity assist;To chair/3-in-1 Details for Transfer Assistance: v/c for Rt lE     Exercise     Balance     End of Session OT - End of Session Activity Tolerance: Patient tolerated treatment well Patient left: in bed;with call bell/phone within reach Nurse Communication: Mobility status;Precautions  GO     [name] 03/17/2013, 3:49 PM Pager: [phone]

## 2013-03-18 DIAGNOSIS — D509 Iron deficiency anemia, unspecified: Secondary | ICD-10-CM

## 2013-03-18 LAB — BASIC METABOLIC PANEL
BUN: 13 mg/dL (ref 6–23)
CO2: 29 mEq/L (ref 19–32)
Calcium: 7.8 mg/dL — ABNORMAL LOW (ref 8.4–10.5)
Creatinine, Ser: 1.11 mg/dL — ABNORMAL HIGH (ref 0.50–1.10)
GFR calc Af Amer: 51 mL/min — ABNORMAL LOW (ref >90)

## 2013-03-18 LAB — IRON AND TIBC: Iron: <10 ug/dL — ABNORMAL LOW (ref 42–135)

## 2013-03-18 LAB — CBC
HCT: 29.1 % — ABNORMAL LOW (ref 36.0–46.0)
MCH: 30.5 pg (ref 26.0–34.0)
MCV: 91.5 fL (ref 78.0–100.0)
Platelets: 154 10*3/uL (ref 150–400)
RBC: 3.18 MIL/uL — ABNORMAL LOW (ref 3.87–5.11)
RDW: 13.6 % (ref 11.5–15.5)

## 2013-03-18 LAB — VITAMIN B12: Vitamin B-12: 317 pg/mL (ref 211–911)

## 2013-03-18 LAB — FOLATE: Folate: 4.8 ng/mL

## 2013-03-18 MED ORDER — FERROUS SULFATE 325 (65 FE) MG PO TABS: 325.0000 mg | ORAL_TABLET | Freq: Three times a day (TID) | ORAL | Status: DC

## 2013-03-18 MED ORDER — INDAPAMIDE 1.25 MG PO TABS: 1.2500 mg | ORAL_TABLET | Freq: Every day | ORAL | Status: DC

## 2013-03-18 MED ORDER — FERROUS SULFATE 325 (65 FE) MG PO TABS
325.0000 mg | ORAL_TABLET | Freq: Three times a day (TID) | ORAL | Status: DC
  Administered 2013-03-18 – 2013-03-20 (×9): 325 mg via ORAL
  Filled 2013-03-18 (×11): qty 1

## 2013-03-18 MED ORDER — AMLODIPINE BESYLATE 5 MG PO TABS: 5.0000 mg | ORAL_TABLET | Freq: Two times a day (BID) | ORAL | Status: DC

## 2013-03-18 NOTE — Progress Notes (Signed)
Pt's b/p was 158/59. RN talked with patient about holding medication- pt's daughter said that her diastolic is always low and to proceed with the medication. RN checked documentations to see other b/p values and pt's diastolic continues to be low. Will continue to monitor

## 2013-03-18 NOTE — Progress Notes (Signed)
[facility] CONSULT NOTE/PROGRESS NOTE  [name] ZOX:[id number] DOB: Jan 10, 1926 DOA: 03/15/2013 PCP: Donata Duff, DO  Assessment/Plan: #1 malignant hypertension Likely with a component of pain. Patient was resumed back on indapamide which she was on in the past. Patient's Norvasc was increased to 5mg  BID yesterday. Continue current regimen. Will need to f/u with PCP as outpatient.   #2 infection of total knee replacement Patient is status post exploration of right total knee replacement with extensive synovectomy, cultures, removal of components and insertion of antibiotic spacer. Continue IV Ancef. Cultures with coag neg staph. Per primary team. ID following.  #3 urinary retention Continue Foley catheter. May consider voiding trial today and if fails place foley back in and start flomax with voiding trial as outpatient with urology f/u.   #4 anemia/ Iron deficiency anemia Likely secondary to postop blood loss and iron deficiency anemia. Patient with no overt GI bleed. H/H stable. Start oral iron supplementation. Follow H&H. Per primary team.  Ok to d/c from medicine perspective with outpatient f/u as stated.  Code Status: Full Family Communication: Updated patient and daughters at bedside. Disposition Plan: Per primary team   Consultants:  [facility] 03/16/2013  Infectious disease Dr. Ninetta Lights 03/16/2013  Procedures:  Status post exploration of right total knee replacement with extensive side effect to me, cultures, removal of components and insertion of antibiotic spacer 03/15/2013 per Dr. Cleophas Dunker  Antibiotics:  IV Ancef 03/16/2013  HPI/Subjective: Patient with no complaints. Patient states feels she needs to have a BM.  Objective: Filed Vitals:   03/18/13 0733  BP:   Pulse:   Temp:   Resp: 18    Intake/Output Summary (Last 24 hours) at 03/18/13 1010 Last data filed at 03/18/13 0800  Gross per 24 hour  Intake    840 ml  Output   1200 ml   Net   -360 ml   Filed Weights   03/17/13 0900  Weight: 80.74 kg (178 lb)    Exam:   General:  NAD  Cardiovascular: RRR  Respiratory: CTAB  Abdomen: Soft, nontender, nondistended, positive bowel sounds  Musculoskeletal: No clubbing cyanosis or edema. RLE in knee immobilizer.  Data Reviewed: Basic Metabolic Panel:  Recent Labs Lab 03/15/13 1820 03/16/13 0509 03/17/13 0550 03/18/13 0519  NA 136 136 135 136  K 3.8 4.1 3.9 4.1  CL 101 103 101 101  CO2 26 24 26 29   GLUCOSE 101* 165* 145* 127*  BUN 14 12 15 13   CREATININE 1.03 1.00 1.21* 1.11*  CALCIUM 8.8 8.3* 7.4* 7.8*   Liver Function Tests:  Recent Labs Lab 03/15/13 1820  AST 16  ALT 11  ALKPHOS 104  BILITOT 0.5  PROT 7.2  ALBUMIN 3.7   No results found for this basename: LIPASE, AMYLASE,  in the last 168 hours No results found for this basename: AMMONIA,  in the last 168 hours CBC:  Recent Labs Lab 03/15/13 1820 03/16/13 0509 03/17/13 0550 03/18/13 0519  WBC 10.4 12.5* 11.9* 10.0  NEUTROABS 8.4*  --   --   --   HGB 14.5 13.5 10.6* 9.7*  HCT 40.9 38.6 31.2* 29.1*  MCV 88.7 89.6 91.0 91.5  PLT 201 207 182 154   Cardiac Enzymes: No results found for this basename: CKTOTAL, CKMB, CKMBINDEX, TROPONINI,  in the last 168 hours BNP (last 3 results) No results found for this basename: PROBNP,  in the last 8760 hours CBG: No results found for this basename: GLUCAP,  in the last 168 hours  Recent Results (from the past 240 hour(s))  URINE CULTURE     Status: None   Collection Time    03/15/13  5:28 PM      Result Value Range Status   Specimen Description URINE, CLEAN CATCH   Final   Special Requests NONE   Final   Culture  Setup Time     Final   Value: 03/15/2013 18:27     Performed at [facility] Count     Final   Value: NO GROWTH     Performed at [facility]   Culture     Final   Value: NO GROWTH     Performed at [facility]   Report Status 03/17/2013  FINAL   Final  MRSA PCR SCREENING     Status: None   Collection Time    03/15/13  5:30 PM      Result Value Range Status   MRSA by PCR NEGATIVE  NEGATIVE Final   Comment:            The GeneXpert MRSA Assay (FDA     approved for NASAL specimens     only), is one component of a     comprehensive MRSA colonization     surveillance program. It is not     intended to diagnose MRSA     infection nor to guide or     monitor treatment for     MRSA infections.  ANAEROBIC CULTURE     Status: None   Collection Time    03/15/13  9:00 PM      Result Value Range Status   Specimen Description WOUND KNEE RIGHT   Final   Special Requests NONE   Final   Gram Stain     Final   Value: FEW WBC PRESENT,BOTH PMN AND MONONUCLEAR     NO SQUAMOUS EPITHELIAL CELLS SEEN     NO ORGANISMS SEEN     Performed at [facility]   Culture     Final   Value: NO ANAEROBES ISOLATED; CULTURE IN PROGRESS FOR 5 DAYS     Performed at [facility]   Report Status PENDING   Incomplete  AFB CULTURE WITH SMEAR     Status: None   Collection Time    03/15/13  9:00 PM      Result Value Range Status   Specimen Description WOUND RIGHT KNEE   Final   Special Requests NONE   Final   ACID FAST SMEAR     Final   Value: NO ACID FAST BACILLI SEEN     Performed at [facility]   Culture     Final   Value: CULTURE WILL BE EXAMINED FOR 6 WEEKS BEFORE ISSUING A FINAL REPORT     Performed at [facility]   Report Status PENDING   Incomplete  WOUND CULTURE     Status: None   Collection Time    03/15/13  9:00 PM      Result Value Range Status   Specimen Description WOUND RIGHT KNEE   Final   Special Requests NONE   Final   Gram Stain     Final   Value: FEW WBC PRESENT,BOTH PMN AND MONONUCLEAR     NO SQUAMOUS EPITHELIAL CELLS SEEN     NO ORGANISMS SEEN     Performed at [facility]   Culture     Final   Value:  FEW STAPHYLOCOCCUS SPECIES (COAGULASE NEGATIVE)     Performed at [facility]   Report Status 03/18/2013 FINAL   Final     Studies: No results found.  Scheduled Meds: . amLODipine  5 mg Oral BID  .  ceFAZolin (ANCEF) IV  2 g Intravenous Q8H  . docusate sodium  100 mg Oral BID  . ferrous sulfate  325 mg Oral TID WC  . indapamide  1.25 mg Oral Daily  . levothyroxine  50 mcg Oral QAC breakfast  . polyethylene glycol  17 g Oral Daily  . prednisoLONE acetate  1 drop Left Eye q morning - 10a  . rivaroxaban  10 mg Oral Daily  . simvastatin  20 mg Oral q1800   Continuous Infusions:   Principal Problem:   Infection of total knee replacement Active Problems:   Infection of total joint prosthesis   HTN (hypertension)   Iron deficiency anemia   HTN (hypertension), malignant   Urinary retention    Time spent: 30 mins    [facility], [place] MD [facility] Pager [phone]. If 7PM-7AM, please contact night-coverage at [email or web address], password [facility] 03/18/2013, 10:10 AM  LOS: 3 days

## 2013-03-18 NOTE — Progress Notes (Signed)
Subjective: 3 Days Post-Op Procedure(s) (LRB): Exploration right total knee replacement , removal of components, and insertion of antibiotic spacer. (Right) Patient reports pain as mild.    Objective: Vital signs in last 24 hours: Temp:  [98.4 F (36.9 C)-99.7 F (37.6 C)] 99.7 F (37.6 C) (12/20 0606) Pulse Rate:  [66-69] 68 (12/20 0606) Resp:  [17-20] 18 (12/20 0733) BP: (150-154)/(42-48) 153/42 mmHg (12/20 0606) SpO2:  [93 %-100 %] 94 % (12/20 0733)  Intake/Output from previous day: 12/19 0701 - 12/20 0700 In: 840 [P.O.:390; I.V.:250; IV Piggyback:200] Out: 1200 [Urine:1200] Intake/Output this shift: Total I/O In: 240 [P.O.:240] Out: 1050 [Urine:1050]   Recent Labs  03/15/13 1820 03/16/13 0509 03/17/13 0550 03/18/13 0519  HGB 14.5 13.5 10.6* 9.7*    Recent Labs  03/17/13 0550 03/18/13 0519  WBC 11.9* 10.0  RBC 3.43* 3.18*  HCT 31.2* 29.1*  PLT 182 154    Recent Labs  03/17/13 0550 03/18/13 0519  NA 135 136  K 3.9 4.1  CL 101 101  CO2 26 29  BUN 15 13  CREATININE 1.21* 1.11*  GLUCOSE 145* 127*  CALCIUM 7.4* 7.8*    Recent Labs  03/15/13 1820  INR 1.01    dressing intact, NVI  Assessment/Plan: 3 Days Post-Op Procedure(s) (LRB): Exploration right total knee replacement , removal of components, and insertion of antibiotic spacer. (Right) Continue ABX therapy due to Post-op infection  [name] C 03/18/2013, 10:44 AM

## 2013-03-18 NOTE — Progress Notes (Signed)
Clinical Child psychotherapist (CSW) met with patient and patient's daughter Terie Purser who was at bedside. Daughter reported that she is going to tour [facility] today and she is going to follow up with Grayce Sessions at [place] to see if they can get a bed at [facility]. CSW will continue to assist with D/C.   Jetta Lout, LCSWA Weekend CSW [phone]

## 2013-03-18 NOTE — Progress Notes (Signed)
Patient states that she spoke with the MD concerning stopping one of b/p medication. She states that her b/p has been doing a whole lot better these past few days. Daughter agrees with mother and states that she will be speaking with the MD in the morning.

## 2013-03-18 NOTE — Progress Notes (Signed)
INFECTIOUS DISEASE PROGRESS NOTE  ID: [name] is a 79 y.o. female with  Principal Problem:   Infection of total knee replacement Active Problems:   Infection of total joint prosthesis   HTN (hypertension)   Iron deficiency anemia   HTN (hypertension), malignant   Urinary retention  Subjective: Without complaint. Just urinated for first time since foley.   Abtx:  Anti-infectives   Start     Dose/Rate Route Frequency Ordered Stop   03/16/13 1400  ceFAZolin (ANCEF) IVPB 2 g/50 mL premix    Comments:  PATIENT HAS HAD INFECTED TKR REQUIRING LONGER ANTIBX   2 g 100 mL/hr over 30 Minutes Intravenous 3 times per day 03/16/13 1131     03/16/13 0100  ceFAZolin (ANCEF) IVPB 2 g/50 mL premix  Status:  Discontinued    Comments:  PATIENT HAS HAD INFECTED TKR REQUIRING LONGER ANTIBX   2 g 100 mL/hr over 30 Minutes Intravenous Every 6 hours 03/16/13 0000 03/16/13 1131   03/15/13 2015  ceFAZolin (ANCEF) powder 2 g     2 g Other To Surgery 03/15/13 2011 03/15/13 2158      Medications:  Scheduled: . amLODipine  5 mg Oral BID  .  ceFAZolin (ANCEF) IV  2 g Intravenous Q8H  . docusate sodium  100 mg Oral BID  . ferrous sulfate  325 mg Oral TID WC  . indapamide  1.25 mg Oral Daily  . levothyroxine  50 mcg Oral QAC breakfast  . polyethylene glycol  17 g Oral Daily  . prednisoLONE acetate  1 drop Left Eye q morning - 10a  . rivaroxaban  10 mg Oral Daily  . simvastatin  20 mg Oral q1800    Objective: Vital signs in last 24 hours: Temp:  [99.4 F (37.4 C)-99.7 F (37.6 C)] 99.7 F (37.6 C) (12/20 0606) Pulse Rate:  [68-69] 68 (12/20 0606) Resp:  [17-18] 18 (12/20 1118) BP: (150-153)/(42-48) 153/42 mmHg (12/20 0606) SpO2:  [93 %-96 %] 94 % (12/20 1118)   General appearance: alert, cooperative and no distress Extremities: RLE wrapped.  Lab Results  Recent Labs  03/17/13 0550 03/18/13 0519  WBC 11.9* 10.0  HGB 10.6* 9.7*  HCT 31.2* 29.1*  NA 135 136  K 3.9 4.1  CL  101 101  CO2 26 29  BUN 15 13  CREATININE 1.21* 1.11*   Liver Panel  Recent Labs  03/15/13 1820  PROT 7.2  ALBUMIN 3.7  AST 16  ALT 11  ALKPHOS 104  BILITOT 0.5   Sedimentation Rate  Recent Labs  03/16/13 0509  ESRSEDRATE 50*   C-Reactive Protein  Recent Labs  03/16/13 0509  CRP 10.9*    Microbiology: Recent Results (from the past 240 hour(s))  URINE CULTURE     Status: None   Collection Time    03/15/13  5:28 PM      Result Value Range Status   Specimen Description URINE, CLEAN CATCH   Final   Special Requests NONE   Final   Culture  Setup Time     Final   Value: 03/15/2013 18:27     Performed at [facility] Count     Final   Value: NO GROWTH     Performed at [facility]   Culture     Final   Value: NO GROWTH     Performed at [facility]   Report Status 03/17/2013 FINAL   Final  MRSA PCR SCREENING  Status: None   Collection Time    03/15/13  5:30 PM      Result Value Range Status   MRSA by PCR NEGATIVE  NEGATIVE Final   Comment:            The GeneXpert MRSA Assay (FDA     approved for NASAL specimens     only), is one component of a     comprehensive MRSA colonization     surveillance program. It is not     intended to diagnose MRSA     infection nor to guide or     monitor treatment for     MRSA infections.  ANAEROBIC CULTURE     Status: None   Collection Time    03/15/13  9:00 PM      Result Value Range Status   Specimen Description WOUND KNEE RIGHT   Final   Special Requests NONE   Final   Gram Stain     Final   Value: FEW WBC PRESENT,BOTH PMN AND MONONUCLEAR     NO SQUAMOUS EPITHELIAL CELLS SEEN     NO ORGANISMS SEEN     Performed at [facility]   Culture     Final   Value: NO ANAEROBES ISOLATED; CULTURE IN PROGRESS FOR 5 DAYS     Performed at [facility]   Report Status PENDING   Incomplete  AFB CULTURE WITH SMEAR     Status: None   Collection Time    03/15/13  9:00 PM       Result Value Range Status   Specimen Description WOUND RIGHT KNEE   Final   Special Requests NONE   Final   ACID FAST SMEAR     Final   Value: NO ACID FAST BACILLI SEEN     Performed at [facility]   Culture     Final   Value: CULTURE WILL BE EXAMINED FOR 6 WEEKS BEFORE ISSUING A FINAL REPORT     Performed at [facility]   Report Status PENDING   Incomplete  WOUND CULTURE     Status: None   Collection Time    03/15/13  9:00 PM      Result Value Range Status   Specimen Description WOUND RIGHT KNEE   Final   Special Requests NONE   Final   Gram Stain     Final   Value: FEW WBC PRESENT,BOTH PMN AND MONONUCLEAR     NO SQUAMOUS EPITHELIAL CELLS SEEN     NO ORGANISMS SEEN     Performed at [facility]   Culture     Final   Value: FEW STAPHYLOCOCCUS SPECIES (COAGULASE NEGATIVE)     Performed at [facility]   Report Status 03/18/2013 FINAL   Final    Studies/Results: No results found.   Assessment/Plan: TKR infection  resected 12/17  MSSE (grown at [facility] [phone] ext [phone])  HTN, uncontrolled   Total days of antibiotics: 3 ancef  Will continue her current anbx  OR Cx at [facility]- [facility]- sensi not done [place] lab shows (MSSE R-PEN)  I have asked lab to do sensi on her isolate.   I would recommend treating her with ancef for 6 weeks.  Will have her f/u in ID clinic.  availalble if questions          [name] Infectious Diseases (pager) [phone] [email or web address] 03/18/2013, 2:07 PM  LOS: 3 days

## 2013-03-19 LAB — WOUND CULTURE

## 2013-03-19 MED ORDER — AMLODIPINE BESYLATE 10 MG PO TABS
10.0000 mg | ORAL_TABLET | Freq: Every day | ORAL | Status: DC
  Administered 2013-03-19 – 2013-03-20 (×2): 10 mg via ORAL
  Filled 2013-03-19 (×2): qty 1

## 2013-03-19 MED ORDER — LISINOPRIL 20 MG PO TABS: 20.0000 mg | ORAL_TABLET | Freq: Every day | ORAL | Status: DC

## 2013-03-19 MED ORDER — HYDRALAZINE HCL 25 MG PO TABS
25.0000 mg | ORAL_TABLET | Freq: Three times a day (TID) | ORAL | Status: DC
  Administered 2013-03-19 – 2013-03-20 (×4): 25 mg via ORAL
  Filled 2013-03-19 (×8): qty 1

## 2013-03-19 NOTE — Progress Notes (Signed)
[facility] CONSULT NOTE/PROGRESS NOTE  Kareli Pittmon AVW:[id number] DOB: [birth year] DOA: 03/15/2013 PCP: Donata Duff, DO  Assessment/Plan: #1 malignant hypertension Likely with a component of pain. Patient was resumed back on indapamide which she was on in the past. Patient's Norvasc was increased to 10mg . Will start hydralazine 25 mg TID. Follow.  #2 infection of total knee replacement Patient is status post exploration of right total knee replacement with extensive synovectomy, cultures, removal of components and insertion of antibiotic spacer. Continue IV Ancef. Cultures with coag neg staph. Per primary team. ID following.  #3 urinary retention  Foley catheter d/c'd. Patient with good UOP.   #4 anemia/ Iron deficiency anemia Likely secondary to postop blood loss and iron deficiency anemia. Patient with no overt GI bleed. H/H stable. Start oral iron supplementation. Follow H&H. Per primary team.   Code Status: Full Family Communication: Updated patient and daughters at bedside. Disposition Plan: Per primary team   Consultants:  [facility] 03/16/2013  Infectious disease Dr. Ninetta Lights 03/16/2013  Procedures:  Status post exploration of right total knee replacement with extensive side effect to me, cultures, removal of components and insertion of antibiotic spacer 03/15/2013 per Dr. Cleophas Dunker  Antibiotics:  IV Ancef 03/16/2013  HPI/Subjective: Patient with no complaints.   Objective: Filed Vitals:   03/19/13 1316  BP: 152/54  Pulse: 76  Temp: 99.2 F (37.3 C)  Resp: 18    Intake/Output Summary (Last 24 hours) at 03/19/13 1507 Last data filed at 03/19/13 1200  Gross per 24 hour  Intake    720 ml  Output      0 ml  Net    720 ml   Filed Weights   03/17/13 0900  Weight: 80.74 kg (178 lb)    Exam:   General:  NAD  Cardiovascular: RRR  Respiratory: CTAB  Abdomen: Soft, nontender, nondistended, positive bowel sounds  Musculoskeletal:  No clubbing cyanosis or edema. RLE in knee immobilizer.  Data Reviewed: Basic Metabolic Panel:  Recent Labs Lab 03/15/13 1820 03/16/13 0509 03/17/13 0550 03/18/13 0519  NA 136 136 135 136  K 3.8 4.1 3.9 4.1  CL 101 103 101 101  CO2 26 24 26 29   GLUCOSE 101* 165* 145* 127*  BUN 14 12 15 13   CREATININE 1.03 1.00 1.21* 1.11*  CALCIUM 8.8 8.3* 7.4* 7.8*   Liver Function Tests:  Recent Labs Lab 03/15/13 1820  AST 16  ALT 11  ALKPHOS 104  BILITOT 0.5  PROT 7.2  ALBUMIN 3.7   No results found for this basename: LIPASE, AMYLASE,  in the last 168 hours No results found for this basename: AMMONIA,  in the last 168 hours CBC:  Recent Labs Lab 03/15/13 1820 03/16/13 0509 03/17/13 0550 03/18/13 0519  WBC 10.4 12.5* 11.9* 10.0  NEUTROABS 8.4*  --   --   --   HGB 14.5 13.5 10.6* 9.7*  HCT 40.9 38.6 31.2* 29.1*  MCV 88.7 89.6 91.0 91.5  PLT 201 207 182 154   Cardiac Enzymes: No results found for this basename: CKTOTAL, CKMB, CKMBINDEX, TROPONINI,  in the last 168 hours BNP (last 3 results) No results found for this basename: PROBNP,  in the last 8760 hours CBG: No results found for this basename: GLUCAP,  in the last 168 hours  Recent Results (from the past 240 hour(s))  URINE CULTURE     Status: None   Collection Time    03/15/13  5:28 PM      Result Value Range Status  Specimen Description URINE, CLEAN CATCH   Final   Special Requests NONE   Final   Culture  Setup Time     Final   Value: 03/15/2013 18:27     Performed at [facility] Count     Final   Value: NO GROWTH     Performed at [facility]   Culture     Final   Value: NO GROWTH     Performed at [facility]   Report Status 03/17/2013 FINAL   Final  MRSA PCR SCREENING     Status: None   Collection Time    03/15/13  5:30 PM      Result Value Range Status   MRSA by PCR NEGATIVE  NEGATIVE Final   Comment:            The GeneXpert MRSA Assay (FDA     approved for  NASAL specimens     only), is one component of a     comprehensive MRSA colonization     surveillance program. It is not     intended to diagnose MRSA     infection nor to guide or     monitor treatment for     MRSA infections.  ANAEROBIC CULTURE     Status: None   Collection Time    03/15/13  9:00 PM      Result Value Range Status   Specimen Description WOUND KNEE RIGHT   Final   Special Requests NONE   Final   Gram Stain     Final   Value: FEW WBC PRESENT,BOTH PMN AND MONONUCLEAR     NO SQUAMOUS EPITHELIAL CELLS SEEN     NO ORGANISMS SEEN     Performed at [facility]   Culture     Final   Value: NO ANAEROBES ISOLATED; CULTURE IN PROGRESS FOR 5 DAYS     Performed at [facility]   Report Status PENDING   Incomplete  AFB CULTURE WITH SMEAR     Status: None   Collection Time    03/15/13  9:00 PM      Result Value Range Status   Specimen Description WOUND RIGHT KNEE   Final   Special Requests NONE   Final   ACID FAST SMEAR     Final   Value: NO ACID FAST BACILLI SEEN     Performed at [facility]   Culture     Final   Value: CULTURE WILL BE EXAMINED FOR 6 WEEKS BEFORE ISSUING A FINAL REPORT     Performed at [facility]   Report Status PENDING   Incomplete  WOUND CULTURE     Status: None   Collection Time    03/15/13  9:00 PM      Result Value Range Status   Specimen Description WOUND RIGHT KNEE   Final   Special Requests NONE   Final   Gram Stain     Final   Value: FEW WBC PRESENT,BOTH PMN AND MONONUCLEAR     NO SQUAMOUS EPITHELIAL CELLS SEEN     NO ORGANISMS SEEN     Performed at [facility]   Culture     Final   Value: FEW STAPHYLOCOCCUS SPECIES (COAGULASE NEGATIVE)     Note: RIFAMPIN AND GENTAMICIN SHOULD NOT BE USED AS SINGLE DRUGS FOR TREATMENT OF STAPH INFECTIONS.     Performed at [facility]   Report Status  03/19/2013 FINAL   Final   Organism ID, Bacteria STAPHYLOCOCCUS SPECIES (COAGULASE NEGATIVE)   Final      Studies: No results found.  Scheduled Meds: . amLODipine  10 mg Oral Daily  .  ceFAZolin (ANCEF) IV  2 g Intravenous Q8H  . docusate sodium  100 mg Oral BID  . ferrous sulfate  325 mg Oral TID WC  . hydrALAZINE  25 mg Oral Q8H  . indapamide  1.25 mg Oral Daily  . levothyroxine  50 mcg Oral QAC breakfast  . polyethylene glycol  17 g Oral Daily  . prednisoLONE acetate  1 drop Left Eye q morning - 10a  . rivaroxaban  10 mg Oral Daily  . simvastatin  20 mg Oral q1800   Continuous Infusions:   Principal Problem:   Infection of total knee replacement Active Problems:   Infection of total joint prosthesis   HTN (hypertension)   Iron deficiency anemia   HTN (hypertension), malignant   Urinary retention    Time spent: 30 mins    [facility] MD [facility] Pager [phone]. If 7PM-7AM, please contact night-coverage at [email or web address], password [facility] 03/19/2013, 3:07 PM  LOS: 4 days

## 2013-03-19 NOTE — Progress Notes (Addendum)
Clinical Child psychotherapist (CSW) met with patient's daughter Terie Purser at bedside. Daughter reported that she went to [facility] and met with the nurses and they reported there were beds available. Daughter reported that she went to [facility] and is agreeable to [facility] if [facility] does not offer a bed Monday. CSW explained to daughter that patient will likely be D/C'ed Monday 03/20/13 and CSW will contact [facility] and pursue a bed. If [facility] does not extend a bed offer then CSW will pursue a bed at [facility]. Daughter verbalized her understanding.    Jetta Lout, LCSWA Weekend CSW [phone]

## 2013-03-19 NOTE — Progress Notes (Signed)
Subjective: 4 Days Post-Op Procedure(s) (LRB): Exploration right total knee replacement , removal of components, and insertion of antibiotic spacer. (Right) Patient reports pain as moderate.    Objective: Vital signs in last 24 hours: Temp:  [99.1 F (37.3 C)-99.7 F (37.6 C)] 99.6 F (37.6 C) (12/21 1610) Pulse Rate:  [72-80] 80 (12/21 0613) Resp:  [16-20] 18 (12/21 0800) BP: (151-182)/(44-59) 182/54 mmHg (12/21 0613) SpO2:  [95 %-98 %] 98 % (12/21 0800)  Intake/Output from previous day: 12/20 0701 - 12/21 0700 In: 720 [P.O.:720] Out: 1375 [Urine:1375] Intake/Output this shift: Total I/O In: 240 [P.O.:240] Out: -    Recent Labs  03/17/13 0550 03/18/13 0519  HGB 10.6* 9.7*    Recent Labs  03/17/13 0550 03/18/13 0519  WBC 11.9* 10.0  RBC 3.43* 3.18*  HCT 31.2* 29.1*  PLT 182 154    Recent Labs  03/17/13 0550 03/18/13 0519  NA 135 136  K 3.9 4.1  CL 101 101  CO2 26 29  BUN 15 13  CREATININE 1.21* 1.11*  GLUCOSE 145* 127*  CALCIUM 7.4* 7.8*   No results found for this basename: LABPT, INR,  in the last 72 hours  Neurologically intact  Assessment/Plan: 4 Days Post-Op Procedure(s) (LRB): Exploration right total knee replacement , removal of components, and insertion of antibiotic spacer. (Right) Up with therapy   WBC is trending down. Creatine back down  Daniesha Driver C 03/19/2013, 11:43 AM

## 2013-03-20 DIAGNOSIS — M7989 Other specified soft tissue disorders: Secondary | ICD-10-CM

## 2013-03-20 DIAGNOSIS — M79609 Pain in unspecified limb: Secondary | ICD-10-CM

## 2013-03-20 LAB — BASIC METABOLIC PANEL
CO2: 33 mEq/L — ABNORMAL HIGH (ref 19–32)
Calcium: 8.4 mg/dL (ref 8.4–10.5)
Chloride: 97 mEq/L (ref 96–112)
Creatinine, Ser: 0.93 mg/dL (ref 0.50–1.10)
GFR calc non Af Amer: 54 mL/min — ABNORMAL LOW (ref >90)
Glucose, Bld: 128 mg/dL — ABNORMAL HIGH (ref 70–99)
Sodium: 136 mEq/L (ref 135–145)

## 2013-03-20 LAB — ANAEROBIC CULTURE

## 2013-03-20 MED ORDER — HYDRALAZINE HCL 50 MG PO TABS
50.0000 mg | ORAL_TABLET | Freq: Three times a day (TID) | ORAL | Status: DC
  Administered 2013-03-20: 50 mg via ORAL
  Filled 2013-03-20 (×3): qty 1

## 2013-03-20 MED ORDER — AMLODIPINE BESYLATE 10 MG PO TABS: 10.0000 mg | ORAL_TABLET | Freq: Every day | ORAL | Status: DC

## 2013-03-20 MED ORDER — POLYETHYLENE GLYCOL 3350 17 G PO PACK: 17.0000 g | PACK | Freq: Every day | ORAL | Status: DC

## 2013-03-20 MED ORDER — CEFAZOLIN SODIUM-DEXTROSE 2-3 GM-% IV SOLR: 2.0000 g | Freq: Three times a day (TID) | INTRAVENOUS | Status: DC

## 2013-03-20 MED ORDER — RIVAROXABAN 10 MG PO TABS: 10.0000 mg | ORAL_TABLET | Freq: Every day | ORAL | Status: DC

## 2013-03-20 MED ORDER — BISACODYL 10 MG RE SUPP: 10.0000 mg | Freq: Every day | RECTAL | Status: DC | PRN

## 2013-03-20 MED ORDER — HEPARIN SOD (PORK) LOCK FLUSH 100 UNIT/ML IV SOLN
250.0000 [IU] | INTRAVENOUS | Status: AC | PRN
  Administered 2013-03-20: 250 [IU]

## 2013-03-20 MED ORDER — HYDRALAZINE HCL 50 MG PO TABS: 50.0000 mg | ORAL_TABLET | Freq: Three times a day (TID) | ORAL | Status: DC

## 2013-03-20 MED ORDER — OXYCODONE HCL 5 MG PO TABS: 5.0000 mg | ORAL_TABLET | ORAL | Status: DC | PRN

## 2013-03-20 MED ORDER — ACETAMINOPHEN 325 MG PO TABS: 650.0000 mg | ORAL_TABLET | Freq: Four times a day (QID) | ORAL | Status: DC | PRN

## 2013-03-20 NOTE — Progress Notes (Signed)
Patient is medically stable for D/C today. Patient and patient's daughter Leretha Dykes reported that their first choice of skilled nursing facility is [facility]. Clinical Social Worker (CSW) contacted both admissions coordinators at [facility] to pursue a bed. Elizabeth the admissions coordinator at [facility] reported that "there are no beds available and there is nothing they can do." Patient and daughter's second choice is [facility]. Admissions coordinator Melissa at [facility] confirmed that patient had a bed at her facility. Melissa reported that "patient can have a private room if they privately pay an [place] of $600 cash or check only, no credit." CSW made daughter and patient aware of the [facility] for a private room. Patient and daughter verbalized their understanding. Daughter of patient signed paper work with admissions coordinator Melissa at [facility] at 1:30 pm. CSW prepared D/C packet and arranged non-emergency EMS [facility]) for transport to [facility]. Family of patient is aware of above and nursing is aware of above. Please resoncult if further social work needs arise. CSW signing off.   Jetta Lout, LCSWA Weekend CSW [phone]

## 2013-03-20 NOTE — Progress Notes (Signed)
[facility] CONSULT NOTE/PROGRESS NOTE  [name] JYN:[id number] DOB: [birth year] DOA: 03/15/2013 PCP: Donata Duff, DO  Assessment/Plan: #1 malignant hypertension Likely with a component of pain. Patient was resumed back on indapamide which she was on in the past. Patient's Norvasc was increased to 10mg . Increase hydralazine to 50 mg TID. Follow.  #2 infection of total knee replacement Patient is status post exploration of right total knee replacement with extensive synovectomy, cultures, removal of components and insertion of antibiotic spacer. Continue IV Ancef. Cultures with coag neg staph. Per primary team. ID following.  #3 urinary retention  Foley catheter d/c'd. Patient with good UOP. Resolved.  #4 anemia/ Iron deficiency anemia Likely secondary to postop blood loss and iron deficiency anemia. Patient with no overt GI bleed. H/H stable. Continue oral iron supplementation. Follow H&H. Per primary team.  OK to be discharged from medicine standpoint. Outpatient f/u with PCP.   Code Status: Full Family Communication: Updated patient and daughters at bedside. Disposition Plan: Per primary team   Consultants:  [facility] 03/16/2013  Infectious disease Dr. Ninetta Lights 03/16/2013  Procedures:  Status post exploration of right total knee replacement with extensive side effect to me, cultures, removal of components and insertion of antibiotic spacer 03/15/2013 per Dr. Cleophas Dunker  Antibiotics:  IV Ancef 03/16/2013  HPI/Subjective: Patient c/o leg pain.   Objective: Filed Vitals:   03/20/13 0500  BP: 159/58  Pulse: 80  Temp: 98.2 F (36.8 C)  Resp: 20    Intake/Output Summary (Last 24 hours) at 03/20/13 1103 Last data filed at 03/20/13 0900  Gross per 24 hour  Intake   1280 ml  Output      0 ml  Net   1280 ml   Filed Weights   03/17/13 0900  Weight: 80.74 kg (178 lb)    Exam:   General:  NAD  Cardiovascular: RRR  Respiratory:  CTAB  Abdomen: Soft, nontender, nondistended, positive bowel sounds  Musculoskeletal: No clubbing cyanosis or edema. RLE in knee immobilizer.  Data Reviewed: Basic Metabolic Panel:  Recent Labs Lab 03/15/13 1820 03/16/13 0509 03/17/13 0550 03/18/13 0519 03/20/13 0500  NA 136 136 135 136 136  K 3.8 4.1 3.9 4.1 3.6  CL 101 103 101 101 97  CO2 26 24 26 29  33*  GLUCOSE 101* 165* 145* 127* 128*  BUN 14 12 15 13 11   CREATININE 1.03 1.00 1.21* 1.11* 0.93  CALCIUM 8.8 8.3* 7.4* 7.8* 8.4   Liver Function Tests:  Recent Labs Lab 03/15/13 1820  AST 16  ALT 11  ALKPHOS 104  BILITOT 0.5  PROT 7.2  ALBUMIN 3.7   No results found for this basename: LIPASE, AMYLASE,  in the last 168 hours No results found for this basename: AMMONIA,  in the last 168 hours CBC:  Recent Labs Lab 03/15/13 1820 03/16/13 0509 03/17/13 0550 03/18/13 0519  WBC 10.4 12.5* 11.9* 10.0  NEUTROABS 8.4*  --   --   --   HGB 14.5 13.5 10.6* 9.7*  HCT 40.9 38.6 31.2* 29.1*  MCV 88.7 89.6 91.0 91.5  PLT 201 207 182 154   Cardiac Enzymes: No results found for this basename: CKTOTAL, CKMB, CKMBINDEX, TROPONINI,  in the last 168 hours BNP (last 3 results) No results found for this basename: PROBNP,  in the last 8760 hours CBG: No results found for this basename: GLUCAP,  in the last 168 hours  Recent Results (from the past 240 hour(s))  URINE CULTURE     Status: None  Collection Time    03/15/13  5:28 PM      Result Value Range Status   Specimen Description URINE, CLEAN CATCH   Final   Special Requests NONE   Final   Culture  Setup Time     Final   Value: 03/15/2013 18:27     Performed at [facility] Count     Final   Value: NO GROWTH     Performed at [facility]   Culture     Final   Value: NO GROWTH     Performed at [facility]   Report Status 03/17/2013 FINAL   Final  MRSA PCR SCREENING     Status: None   Collection Time    03/15/13  5:30 PM       Result Value Range Status   MRSA by PCR NEGATIVE  NEGATIVE Final   Comment:            The GeneXpert MRSA Assay (FDA     approved for NASAL specimens     only), is one component of a     comprehensive MRSA colonization     surveillance program. It is not     intended to diagnose MRSA     infection nor to guide or     monitor treatment for     MRSA infections.  ANAEROBIC CULTURE     Status: None   Collection Time    03/15/13  9:00 PM      Result Value Range Status   Specimen Description WOUND KNEE RIGHT   Final   Special Requests NONE   Final   Gram Stain     Final   Value: FEW WBC PRESENT,BOTH PMN AND MONONUCLEAR     NO SQUAMOUS EPITHELIAL CELLS SEEN     NO ORGANISMS SEEN     Performed at [facility]   Culture     Final   Value: NO ANAEROBES ISOLATED     Performed at [facility]   Report Status 03/20/2013 FINAL   Final  AFB CULTURE WITH SMEAR     Status: None   Collection Time    03/15/13  9:00 PM      Result Value Range Status   Specimen Description WOUND RIGHT KNEE   Final   Special Requests NONE   Final   ACID FAST SMEAR     Final   Value: NO ACID FAST BACILLI SEEN     Performed at [facility]   Culture     Final   Value: CULTURE WILL BE EXAMINED FOR 6 WEEKS BEFORE ISSUING A FINAL REPORT     Performed at [facility]   Report Status PENDING   Incomplete  WOUND CULTURE     Status: None   Collection Time    03/15/13  9:00 PM      Result Value Range Status   Specimen Description WOUND RIGHT KNEE   Final   Special Requests NONE   Final   Gram Stain     Final   Value: FEW WBC PRESENT,BOTH PMN AND MONONUCLEAR     NO SQUAMOUS EPITHELIAL CELLS SEEN     NO ORGANISMS SEEN     Performed at [facility]   Culture     Final   Value: FEW STAPHYLOCOCCUS SPECIES (COAGULASE NEGATIVE)     Note: RIFAMPIN AND GENTAMICIN SHOULD NOT BE USED AS SINGLE DRUGS FOR TREATMENT OF  STAPH INFECTIONS.     Performed at [facility]   Report  Status 03/19/2013 FINAL   Final   Organism ID, Bacteria STAPHYLOCOCCUS SPECIES (COAGULASE NEGATIVE)   Final     Studies: No results found.  Scheduled Meds: . amLODipine  10 mg Oral Daily  .  ceFAZolin (ANCEF) IV  2 g Intravenous Q8H  . docusate sodium  100 mg Oral BID  . ferrous sulfate  325 mg Oral TID WC  . hydrALAZINE  50 mg Oral Q8H  . indapamide  1.25 mg Oral Daily  . levothyroxine  50 mcg Oral QAC breakfast  . polyethylene glycol  17 g Oral Daily  . prednisoLONE acetate  1 drop Left Eye q morning - 10a  . rivaroxaban  10 mg Oral Daily  . simvastatin  20 mg Oral q1800   Continuous Infusions:   Principal Problem:   Infection of total knee replacement Active Problems:   Infection of total joint prosthesis   HTN (hypertension)   Iron deficiency anemia   HTN (hypertension), malignant   Urinary retention    Time spent: 30 mins    [facility] MD [facility] Pager [phone]. If 7PM-7AM, please contact night-coverage at [email or web address], password [facility] 03/20/2013, 11:03 AM  LOS: 5 days

## 2013-03-20 NOTE — Progress Notes (Signed)
Patient ID: Taneia Mealor, female   DOB: 05-Apr-1925, 79 y.o.   MRN: [id number] PATIENT ID: Gerrianne Aydelott        MRN:  [id number]          DOB/AGE: [birth year] / 79 y.o.    [name], MD   Jacqualine Code, PA-C [address] [place], [place]  [address]                             [phone]   PROGRESS NOTE  Subjective:  negative for Chest Pain  negative for Shortness of Breath  negative for Nausea/Vomiting   positive for Calf Pain right    Tolerating Diet: yes         Patient reports pain as mild and moderate.     Having pain in right calf   Objective: Vital signs in last 24 hours:   Patient Vitals for the past 24 hrs:  BP Temp Temp src Pulse Resp SpO2  03/20/13 0500 159/58 mmHg 98.2 F (36.8 C) Oral 80 20 97 %  03/19/13 2207 168/59 mmHg - - - - -  03/19/13 2100 185/60 mmHg 99.2 F (37.3 C) Oral 79 20 95 %  03/19/13 1316 152/54 mmHg 99.2 F (37.3 C) - 76 18 93 %  03/19/13 1200 - - - - 18 -      Intake/Output from previous day:   12/21 0701 - 12/22 0700 In: 1280 [P.O.:840; I.V.:240] Out: -    Intake/Output this shift:       Intake/Output     12/21 0701 - 12/22 0700 12/22 0701 - 12/23 0700   P.O. 840    I.V. (mL/kg) 240 (3)    IV Piggyback 200    Total Intake(mL/kg) 1280 (15.9)    Urine (mL/kg/hr)     Total Output       Net +1280          Urine Occurrence 6 x       LABORATORY DATA:  Recent Labs  03/15/13 1820 03/16/13 0509 03/17/13 0550 03/18/13 0519  WBC 10.4 12.5* 11.9* 10.0  HGB 14.5 13.5 10.6* 9.7*  HCT 40.9 38.6 31.2* 29.1*  PLT 201 207 182 154    Recent Labs  03/15/13 1820 03/16/13 0509 03/17/13 0550 03/18/13 0519 03/20/13 0500  NA 136 136 135 136 136  K 3.8 4.1 3.9 4.1 3.6  CL 101 103 101 101 97  CO2 26 24 26 29  33*  BUN 14 12 15 13 11   CREATININE 1.03 1.00 1.21* 1.11* 0.93  GLUCOSE 101* 165* 145* 127* 128*  CALCIUM 8.8 8.3* 7.4* 7.8* 8.4   Lab Results  Component Value Date   INR 1.01 03/15/2013   INR 1.16 03/08/2012     INR 0.93 01/10/2010    Recent Radiographic Studies :  Chest 2 View  03/15/2013   CLINICAL DATA:  Preoperative examination  EXAM: CHEST  2 VIEW  COMPARISON:  03/08/2012; 01/10/2010; 09/04/2003  FINDINGS: Grossly unchanged borderline enlarged cardiac silhouette and mediastinal contours. Evaluation of retrosternal scar space is obscured secondary to overlying soft tissues. No focal airspace opacities. No pleural effusion or pneumothorax. No evidence of edema. Post left-sided mastectomy. Surgical clips overlie the left axilla. Grossly unchanged bones including high-riding right humeral head suggestive of rotator cuff pathology.  IMPRESSION: No acute cardiopulmonary disease.   Electronically Signed   By: Simonne Come M.D.   On: 03/15/2013 18:19     Examination:  General appearance: alert, cooperative and mild distress  Wound Exam: clean, dry, intact   Drainage:  None: wound tissue dry  Motor Exam: EHL, FHL, Anterior Tibial and Posterior Tibial Intact  Sensory Exam: Superficial Peroneal, Deep Peroneal and Tibial normal  Vascular Exam: Right dorsalis pedis artery has trace pulse  Assessment:    5 Days Post-Op  Procedure(s) (LRB): Exploration right total knee replacement , removal of components, and insertion of antibiotic spacer. (Right)  ADDITIONAL DIAGNOSIS:  Principal Problem:   Infection of total knee replacement Active Problems:   Infection of total joint prosthesis   HTN (hypertension)   Iron deficiency anemia   HTN (hypertension), malignant   Urinary retention  Acute Blood Loss Anemia asymptomatic   Plan: Physical Therapy as ordered Partial Weight Bearing @ 50% (PWB)  DVT Prophylaxis:  Xarelto, Foot Pumps and TED hose  DISCHARGE PLAN: Skilled Nursing Facility/Rehab  DISCHARGE NEEDS: HHPT, HHRN, Walker, 3-in-1 comode seat and IV Antibiotics  BUN, Creatinine normalized.  BP better  STAT doppler this morning to r/o DVT  Plan D/C today to [facility]        [facility] 03/20/2013 8:47 AM

## 2013-03-20 NOTE — Progress Notes (Signed)
Physical Therapy Treatment Patient Details Name: [name] MRN: [id number] DOB: 12-05-1925 Today's Date: 03/20/2013 Time: 4010-2725 PT Time Calculation (min): 29 min  PT Assessment / Plan / Recommendation  History of Present Illness Pt is an 79 y/o female admitted s/p I&D of infected R TKA. Components were removed and antibiotic spacer was inserted. Pt is now PWB 50% on the right.    PT Comments   Pt progressing very slow towards physical therapy goals. Overall poor rehab effort today. Pt refused gait training and demonstrated little effort during transfers and therapeutic exercise. Will continue to follow until d/c to SNF.  Follow Up Recommendations  SNF     Does the patient have the potential to tolerate intense rehabilitation     Barriers to Discharge        Equipment Recommendations  None recommended by PT    Recommendations for Other Services    Frequency Min 6X/week   Progress towards PT Goals Progress towards PT goals: Not progressing toward goals - comment (Moving very slow, appears unmotivated)  Plan Current plan remains appropriate    Precautions / Restrictions Precautions Precautions: Fall;Knee Required Braces or Orthoses: Knee Immobilizer - Right Knee Immobilizer - Right: On at all times Restrictions Weight Bearing Restrictions: Yes RLE Weight Bearing: Partial weight bearing RLE Partial Weight Bearing Percentage or Pounds: 50   Pertinent Vitals/Pain 6/10 at rest    Mobility  Bed Mobility Bed Mobility: Supine to Sit;Sitting - Scoot to Edge of Bed Supine to Sit: 3: Mod assist;HOB elevated;With rails Sitting - Scoot to Edge of Bed: 4: Min assist Details for Bed Mobility Assistance: Increased assist required to transition to EOB. Bed pad used to scoot to edge.  Transfers Transfers: Sit to Stand;Stand to Sit Sit to Stand: 3: Mod assist;With upper extremity assist;From bed Stand to Sit: 4: Min assist;With upper extremity assist;To chair/3-in-1 Stand Pivot  Transfers: 4: Min assist Details for Transfer Assistance: VC's for hand placement and safety when positioning herself in front of the chair prior to initiate sitting.  Ambulation/Gait Ambulation/Gait Assistance: Not tested (comment) Ambulation/Gait Assistance Details: Pt refused ambulation once standing, reporting increased SOB and feeling hot. Pt appeared slightly SOB, however no more than previous sessions when ambulation was performed.     Exercises Total Joint Exercises Towel Squeeze: 10 reps Hip ABduction/ADduction: 10 reps   PT Diagnosis:    PT Problem List:   PT Treatment Interventions:     PT Goals (current goals can now be found in the care plan section) Acute Rehab PT Goals Patient Stated Goal: to return home PT Goal Formulation: With patient/family Time For Goal Achievement: 03/23/13 Potential to Achieve Goals: Good  Visit Information  Last PT Received On: 03/20/13 Assistance Needed: +1 (+2 for chair follow) History of Present Illness: Pt is an 79 y/o female admitted s/p I&D of infected R TKA. Components were removed and antibiotic spacer was inserted. Pt is now PWB 50% on the right.     Subjective Data  Subjective: Pt doesn't say much during session, and PT asks direct questions numerous times before pt will verbalize an answer.  Patient Stated Goal: to return home   Cognition  Cognition Arousal/Alertness: Lethargic Behavior During Therapy: Flat affect Overall Cognitive Status: Within Functional Limits for tasks assessed    Balance     End of Session PT - End of Session Equipment Utilized During Treatment: Gait belt;Right knee immobilizer Activity Tolerance: Patient limited by pain;Patient limited by lethargy Patient left: in chair;with call bell/phone  within reach;with family/visitor present Nurse Communication: Mobility status   GP     Ruthann Cancer 03/20/2013, 12:38 PM  Ruthann Cancer, PT, DPT [phone]

## 2013-03-20 NOTE — Progress Notes (Signed)
VASCULAR LAB PRELIMINARY  PRELIMINARY  PRELIMINARY  PRELIMINARY  Right lower extremity venous duplex completed.    Preliminary report:  Right:  No evidence of DVT, superficial thrombosis, or Baker's cyst.  [name], RVS 03/20/2013, 12:38 PM

## 2013-03-25 ENCOUNTER — Encounter (HOSPITAL_COMMUNITY): Payer: Self-pay | Admitting: Emergency Medicine

## 2013-03-25 ENCOUNTER — Emergency Department (HOSPITAL_COMMUNITY)
Admission: EM | Admit: 2013-03-25 | Discharge: 2013-03-25 | Disposition: A | Discharge status: Skilled Nursing Facility | Payer: Medicare Other | Attending: Emergency Medicine | Admitting: Emergency Medicine

## 2013-03-25 DIAGNOSIS — M25569 Pain in unspecified knee: Secondary | ICD-10-CM | POA: Insufficient documentation

## 2013-03-25 DIAGNOSIS — M129 Arthropathy, unspecified: Secondary | ICD-10-CM | POA: Insufficient documentation

## 2013-03-25 DIAGNOSIS — G8918 Other acute postprocedural pain: Secondary | ICD-10-CM | POA: Insufficient documentation

## 2013-03-25 DIAGNOSIS — E7800 Pure hypercholesterolemia, unspecified: Secondary | ICD-10-CM | POA: Insufficient documentation

## 2013-03-25 DIAGNOSIS — M7989 Other specified soft tissue disorders: Secondary | ICD-10-CM

## 2013-03-25 DIAGNOSIS — E039 Hypothyroidism, unspecified: Secondary | ICD-10-CM | POA: Insufficient documentation

## 2013-03-25 DIAGNOSIS — Z8719 Personal history of other diseases of the digestive system: Secondary | ICD-10-CM | POA: Insufficient documentation

## 2013-03-25 DIAGNOSIS — Z8541 Personal history of malignant neoplasm of cervix uteri: Secondary | ICD-10-CM | POA: Insufficient documentation

## 2013-03-25 DIAGNOSIS — Z7982 Long term (current) use of aspirin: Secondary | ICD-10-CM | POA: Insufficient documentation

## 2013-03-25 DIAGNOSIS — I10 Essential (primary) hypertension: Secondary | ICD-10-CM | POA: Insufficient documentation

## 2013-03-25 DIAGNOSIS — Z853 Personal history of malignant neoplasm of breast: Secondary | ICD-10-CM | POA: Insufficient documentation

## 2013-03-25 DIAGNOSIS — Z79899 Other long term (current) drug therapy: Secondary | ICD-10-CM | POA: Insufficient documentation

## 2013-03-25 DIAGNOSIS — Z7901 Long term (current) use of anticoagulants: Secondary | ICD-10-CM | POA: Insufficient documentation

## 2013-03-25 DIAGNOSIS — Z8709 Personal history of other diseases of the respiratory system: Secondary | ICD-10-CM | POA: Insufficient documentation

## 2013-03-25 DIAGNOSIS — R509 Fever, unspecified: Secondary | ICD-10-CM | POA: Insufficient documentation

## 2013-03-25 DIAGNOSIS — Z96659 Presence of unspecified artificial knee joint: Secondary | ICD-10-CM | POA: Insufficient documentation

## 2013-03-25 HISTORY — DX: Other specified postprocedural states: Z98.890

## 2013-03-25 LAB — BASIC METABOLIC PANEL
CO2: 30 mEq/L (ref 19–32)
Calcium: 8.6 mg/dL (ref 8.4–10.5)
Creatinine, Ser: 1.02 mg/dL (ref 0.50–1.10)
GFR calc non Af Amer: 49 mL/min — ABNORMAL LOW (ref >90)
Glucose, Bld: 124 mg/dL — ABNORMAL HIGH (ref 70–99)

## 2013-03-25 LAB — CBC
Hemoglobin: 10 g/dL — ABNORMAL LOW (ref 12.0–15.0)
MCH: 30.9 pg (ref 26.0–34.0)
MCHC: 33.3 g/dL (ref 30.0–36.0)
MCV: 92.6 fL (ref 78.0–100.0)
Platelets: 345 10*3/uL (ref 150–400)
RBC: 3.24 MIL/uL — ABNORMAL LOW (ref 3.87–5.11)

## 2013-03-25 LAB — SEDIMENTATION RATE: Sed Rate: 118 mm/hr — ABNORMAL HIGH (ref 0–22)

## 2013-03-25 MED ORDER — MORPHINE SULFATE 4 MG/ML IJ SOLN
4.0000 mg | Freq: Once | INTRAMUSCULAR | Status: AC
  Administered 2013-03-25: 4 mg via INTRAVENOUS
  Filled 2013-03-25: qty 1

## 2013-03-25 MED ORDER — ONDANSETRON HCL 4 MG/2ML IJ SOLN
4.0000 mg | Freq: Once | INTRAMUSCULAR | Status: AC
  Administered 2013-03-25: 4 mg via INTRAVENOUS
  Filled 2013-03-25: qty 2

## 2013-03-25 NOTE — Progress Notes (Signed)
VASCULAR LAB PRELIMINARY  PRELIMINARY  PRELIMINARY  PRELIMINARY  Right lower extremity venous duplex completed.    Preliminary report:  Right:  No evidence of DVT, superficial thrombosis, or Baker's cyst.  Eileene Kisling, RVT 03/25/2013, 10:14 AM

## 2013-03-25 NOTE — ED Provider Notes (Signed)
CSN: [id number]     Arrival date & time 03/25/13  8413 History   First MD Initiated Contact with Patient 03/25/13 [phone]     Chief Complaint  Patient presents with  . Post-op Problem   (Consider location/radiation/quality/duration/timing/severity/associated sxs/prior Treatment) HPI Pt presenting with c/o pain in right knee. Pt is s/p removal of prosthetics of knee replacement due to infection.  Surgery was 12/18- she had antibiotic spacers placed and is currently on ancef via PICC line.  She reports low grade fever of approx 100 at rehab facility.  Also increased pain over the past 2 days.  She reports increased drainage from wound with clear/bloody tinge.  No vomiting.  States the oxycodone she is receiving for pain gives adequate pain relief.  Movement and palpation make pain worse. There are no other associated systemic symptoms, there are no other alleviating or modifying factors.   Past Medical History  Diagnosis Date  . Hypertension   . Thyroid disease   . Hiatal hernia   . Diverticulosis   . Complication of anesthesia   . PONV (postoperative nausea and vomiting)   . Hypothyroidism   . High cholesterol   . Chronic bronchitis     "yearly for the last 3-4 years" (03/16/2013)  . GERD (gastroesophageal reflux disease)   . Arthritis     "joints" (03/16/2013)  . Breast cancer   . Uterine cancer   . S/P knee surgery     right knee surgery on 12-18   Past Surgical History  Procedure Laterality Date  . Total knee arthroplasty Left 2007  . Patch angioplasty Right 01/16/10    popliteal artery repair  . I&d knee with poly exchange  03/08/2012    Procedure: IRRIGATION AND DEBRIDEMENT KNEE WITH POLY EXCHANGE;  Surgeon: Valeria Batman, MD;  Location: [facility] OR;  Service: Orthopedics;  Laterality: Right;  Right Total Knee Irrigation & Debridement, Synovectomy, Poly Exchange, Placement of Antibiotic Beads  . Joint replacement    . Total knee  prosthesis removal w/ spacer insertion Right  03/15/2013  . Appendectomy    . Total knee arthroplasty Right 01/17/2011  . Mastectomy Left ~ 2005  . Breast biopsy Left   . Glaucoma surgery Bilateral 03/01/2013-03/08/2013  . Abdominal hysterectomy      "left an ovary" (03/16/2013)  . I&d extremity Right 03/15/2013    Procedure: Exploration right total knee replacement , removal of components, and insertion of antibiotic spacer.;  Surgeon: Valeria Batman, MD;  Location: [facility] OR;  Service: Orthopedics;  Laterality: Right;   Family History  Problem Relation Age of Onset  . Diabetes Mother   . Heart disease Father   . Stroke Father   . Hypertension Father   . Heart attack Father   . Cancer Sister   . Diabetes Sister   . Hyperlipidemia Sister   . Hypertension Sister   . Cancer Brother   . Diabetes Brother   . Hypertension Brother   . Hyperlipidemia Daughter    History  Substance Use Topics  . Smoking status: Never Smoker   . Smokeless tobacco: Never Used  . Alcohol Use: 0.0 oz/week     Comment: 03/16/2013 "rarely has a sip of wine; nothing for awhile now"   OB History   Grav Para Term Preterm Abortions TAB SAB Ect Mult Living                 Review of Systems ROS reviewed and all otherwise negative except for mentioned in HPI  Allergies  Naproxen and Nsaids  Home Medications   Current Outpatient Rx  Name  Route  Sig  Dispense  Refill  . acetaminophen (TYLENOL) 325 MG tablet   Oral   Take 2 tablets (650 mg total) by mouth every 6 (six) hours as needed for mild pain (or Fever >/= 101).         Marland Kitchen amLODipine (NORVASC) 10 MG tablet   Oral   Take 1 tablet (10 mg total) by mouth daily.   30 tablet   0   . aspirin 81 MG tablet   Oral   Take 81 mg by mouth daily.         Marland Kitchen CEFAZOLIN SODIUM IV   Intravenous   Inject 2 g into the vein every 8 (eight) hours. Infected knee hardware removal         . Co-Enzyme Q10 200 MG CAPS   Oral   Take 1 tablet by mouth daily.         Marland Kitchen docusate sodium (COLACE) 100  MG capsule   Oral   Take 100 mg by mouth 2 (two) times daily.         . ergocalciferol (VITAMIN D2) 50000 UNITS capsule   Oral   Take 50,000 Units by mouth every 30 (thirty) days. Take on the 1st day of each month.         . ferrous sulfate 325 (65 FE) MG tablet   Oral   Take 1 tablet (325 mg total) by mouth 3 (three) times daily with meals.      3   . hydrALAZINE (APRESOLINE) 50 MG tablet   Oral   Take 1 tablet (50 mg total) by mouth every 8 (eight) hours.   90 tablet   0   . indapamide (LOZOL) 1.25 MG tablet   Oral   Take 1 tablet (1.25 mg total) by mouth daily.   60 tablet   0   . lactobacillus acidophilus (BACID) TABS tablet   Oral   Take 2 tablets by mouth 2 (two) times daily.         Marland Kitchen levothyroxine (SYNTHROID, LEVOTHROID) 50 MCG tablet   Oral   Take 50 mcg by mouth daily.         . magnesium oxide (MAG-OX) 400 MG tablet   Oral   Take 400 mg by mouth daily.         . methocarbamol (ROBAXIN) 500 MG tablet   Oral   Take 500 mg by mouth daily as needed. For cramps.         . Oxycodone HCl 10 MG TABS   Oral   Take 10 mg by mouth every 4 (four) hours as needed (moderate to severe pain).         . polyethylene glycol (MIRALAX / GLYCOLAX) packet   Oral   Take 17 g by mouth daily.   14 each   0   . pravastatin (PRAVACHOL) 40 MG tablet   Oral   Take 40 mg by mouth daily.         . prednisoLONE acetate (PRED FORTE) 1 % ophthalmic suspension   Left Eye   Place 1 drop into the left eye daily.         . rivaroxaban (XARELTO) 10 MG TABS tablet   Oral   Take 1 tablet (10 mg total) by mouth daily.   12 tablet   0   . bisacodyl (DULCOLAX) 10 MG suppository  Rectal   Place 1 suppository (10 mg total) rectally daily as needed for moderate constipation.   12 suppository   0    BP 138/51  Pulse 69  Temp(Src) 98.5 F (36.9 C) (Oral)  Resp 18  Ht 5\' 4"  (1.626 m)  Wt 163 lb (73.936 kg)  BMI 27.97 kg/m2  SpO2 95% Vitals  reviewed Physical Exam Physical Examination: General appearance - alert, well appearing, and in no distress Mental status - alert, oriented to person, place, and time Eyes - no conjunctival injection, no scleral icterus Mouth - mucous membranes moist, pharynx normal without lesions Neck - supple, no significant adenopathy Chest - clear to auscultation, no wheezes, rales or rhonchi, symmetric air entry Heart - normal rate, regular rhythm, normal S1, S2, no murmurs, rubs, clicks or gallops Abdomen - soft, nontender, nondistended, no masses or organomegaly Neurological - alert, oriented, normal speech, sensation intact distally, strength 5/5 in extremities x 4 Musculoskeletal - vertical incision site over right knee appears clean, no surroudning erythema, no prurulent drainage, ttp over joint and pain with movemnt of right knee, otherwise no joint tenderness, deformity or swelling Extremities - peripheral pulses normal, no pedal edema, no clubbing or cyanosis Skin - normal coloration and turgor, no rashes  ED Course  Procedures (including critical care time)  1:09 PM d/w ortho on call for Dr. Cleophas Dunker, Dr. Magnus Ivan, he states that as patient is continuing to be on antibitoics, no frank infection on exam that she should be treated with po pain meds and f/u with Dr. Cleophas Dunker on Monday Labs Review Labs Reviewed  CBC - Abnormal; Notable for the following:    RBC 3.24 (*)    Hemoglobin 10.0 (*)    HCT 30.0 (*)    All other components within normal limits  BASIC METABOLIC PANEL - Abnormal; Notable for the following:    Sodium 134 (*)    Glucose, Bld 124 (*)    GFR calc non Af Amer 49 (*)    GFR calc Af Amer 56 (*)    All other components within normal limits  SEDIMENTATION RATE - Abnormal; Notable for the following:    Sed Rate 118 (*)    All other components within normal limits  C-REACTIVE PROTEIN   Imaging Review No results found.  EKG Interpretation   None       MDM   1.  Postoperative pain of extremity    Pt presenting with right knee pain approx 1 week after removal of knee prosthetics due to infection.  No leukocytosis.  No erythema or prurulent drainage of knee or incision site.  ESR elevated, CRP obtained and pending.  D/w ortho as above- they recommend continue antibiotics and f/u in 2 days with Dr. Lester Kinsman.  Plan d/w patient and family at bedside.  Discharged with strict return precautions.  Pt agreeable with plan.   Ethelda Chick, MD 03/25/13 1540

## 2013-03-25 NOTE — ED Notes (Signed)
Patient presents to ED via EMS from [facility] rehab with complaints of fever, right knee swelling redness. Patient had right knee surgery on 12-17. Patient has picc line to right upper arm.

## 2013-03-31 ENCOUNTER — Other Ambulatory Visit (HOSPITAL_COMMUNITY): Payer: Medicare Other

## 2013-03-31 ENCOUNTER — Inpatient Hospital Stay (HOSPITAL_COMMUNITY): Admission: RE | Admit: 2013-03-31 | Payer: Medicare Other | Source: Ambulatory Visit

## 2013-03-31 ENCOUNTER — Ambulatory Visit: Payer: Medicare Other | Admitting: Family

## 2013-04-06 ENCOUNTER — Encounter: Payer: Self-pay | Admitting: Family

## 2013-04-07 ENCOUNTER — Encounter (HOSPITAL_COMMUNITY): Payer: Medicare Other

## 2013-04-07 ENCOUNTER — Other Ambulatory Visit (HOSPITAL_COMMUNITY): Payer: Medicare Other

## 2013-04-07 ENCOUNTER — Ambulatory Visit: Payer: Medicare Other | Admitting: Family

## 2013-04-11 ENCOUNTER — Encounter: Payer: Self-pay | Admitting: Infectious Diseases

## 2013-04-11 ENCOUNTER — Ambulatory Visit (INDEPENDENT_AMBULATORY_CARE_PROVIDER_SITE_OTHER): Payer: Medicare Other | Admitting: Infectious Diseases

## 2013-04-11 VITALS — Temp 98.7°F | Ht 64.0 in | Wt 160.0 lb

## 2013-04-11 DIAGNOSIS — B957 Other staphylococcus as the cause of diseases classified elsewhere: Secondary | ICD-10-CM

## 2013-04-11 LAB — CBC
HCT: 34.3 % — ABNORMAL LOW (ref 36.0–46.0)
Hemoglobin: 11.5 g/dL — ABNORMAL LOW (ref 12.0–15.0)
MCH: 30.8 pg (ref 26.0–34.0)
MCHC: 33.5 g/dL (ref 30.0–36.0)
MCV: 92 fL (ref 78.0–100.0)
PLATELETS: 252 10*3/uL (ref 150–400)
RBC: 3.73 MIL/uL — AB (ref 3.87–5.11)
RDW: 15.5 % (ref 11.5–15.5)
WBC: 6 10*3/uL (ref 4.0–10.5)

## 2013-04-11 LAB — COMPREHENSIVE METABOLIC PANEL
ALBUMIN: 3.7 g/dL (ref 3.5–5.2)
ALK PHOS: 114 U/L (ref 39–117)
AST: 14 U/L (ref 0–37)
BILIRUBIN TOTAL: 0.3 mg/dL (ref 0.3–1.2)
BUN: 18 mg/dL (ref 6–23)
CO2: 29 mEq/L (ref 19–32)
Calcium: 9 mg/dL (ref 8.4–10.5)
Chloride: 100 mEq/L (ref 96–112)
Creat: 0.93 mg/dL (ref 0.50–1.10)
Glucose, Bld: 108 mg/dL — ABNORMAL HIGH (ref 70–99)
POTASSIUM: 3.9 meq/L (ref 3.5–5.3)
SODIUM: 135 meq/L (ref 135–145)
Total Protein: 6.6 g/dL (ref 6.0–8.3)

## 2013-04-11 LAB — SEDIMENTATION RATE: SED RATE: 45 mm/h — AB (ref 0–22)

## 2013-04-11 LAB — C-REACTIVE PROTEIN: CRP: 0.6 mg/dL — AB (ref <0.60)

## 2013-04-11 MED ORDER — CEPHALEXIN 500 MG PO CAPS: 500.0000 mg | ORAL_CAPSULE | Freq: Two times a day (BID) | ORAL | Status: DC

## 2013-04-11 NOTE — Assessment & Plan Note (Signed)
She is doing well, day 26 of anbx. Will give her 16 more days then pull PIC, restart her on keflex. Will check her CRP and ESR today. Will see her back in 1 month. She asks about timing her re-implantation, will defer this to her orthopedist, assume that we will want to wait til her ESR and CRP are normal.

## 2013-04-11 NOTE — Progress Notes (Signed)
   Subjective:    Patient ID: [name], female    DOB: [birth year], 80 y.o.   MRN: [id number]  HPI 80 yo F with hx of R TKR 10-09. Her surgery was complicated by popliteal artery injury requiring patch. 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 She has had manipulation of her knee 04-2010, I & D/exchange of parts 03-09-2012. She had CoAg negative staph in her pre-operative Cx (R- pen). She was treated with ancef/rifampin then transitioned to keflex and rifampin. She again underwent manipulation of her knee 05-2012.  She was maintained on keflex/rifampin until 11-2012 when it was noted that her ESR and CRP were normal.  She saw ortho recently with swelling, pain and temp to 100. She had aspiration in office and was admitted on 03-15-13 and underwent removal of her prosthesis with placement of anbx spacer. Her Cx grew MSSE and she was d/c home on 12-24 on ancef.  Has been doing well- mild GI upset with anbx, noted mucous stool (having loose BM, attributes to fruits she eats, colace, milk of magnesia- BM usually qday, up to tid). No chills, temp to 99.5.  Wound is well healed, is bearing 50% weight, can stand. Still has some swelling but better. No pain.   Day 26 anbx (of 42)   Review of Systems     Objective:   Physical Exam  Constitutional: She appears well-developed and well-nourished.  Eyes: EOM are normal. Pupils are equal, round, and reactive to light.  Neck: Neck supple.  Cardiovascular: Normal rate, regular rhythm and normal heart sounds.   Pulmonary/Chest: Effort normal and breath sounds normal.  Abdominal: Soft. Bowel sounds are normal. There is no tenderness. There is no rebound.  Musculoskeletal:       Arms:      Legs: Lymphadenopathy:    She has no cervical adenopathy.          Assessment & Plan:

## 2013-04-20 ENCOUNTER — Telehealth: Payer: Self-pay | Admitting: *Deleted

## 2013-04-20 NOTE — Telephone Encounter (Signed)
Patient's daughter saw the sed rate results on mychart and was just wondering if this is anything to be concerned about. Told her it had come down from 3 weeks ago, but would forward Dr. Johnnye Sima a note. Patient has follow up appt 2/11/5. Myrtis Hopping

## 2013-04-21 NOTE — Telephone Encounter (Signed)
That is correct, would read it as improving.  thanks

## 2013-04-24 NOTE — Telephone Encounter (Signed)
Daughter notified Myrtis Hopping

## 2013-04-27 ENCOUNTER — Telehealth: Payer: Self-pay | Admitting: Licensed Clinical Social Worker

## 2013-04-27 LAB — AFB CULTURE WITH SMEAR (NOT AT ARMC): Acid Fast Smear: NONE SEEN

## 2013-04-27 NOTE — Telephone Encounter (Signed)
Patient called concerned that she was only taking Keflex and was wondering why Rifampin is no longer needed. Patient states she is very anxious about getting another infection.

## 2013-05-08 NOTE — Telephone Encounter (Signed)
Can you let her know that she is off the rifampin as she no longer has prosthesis/hardware in place.

## 2013-05-10 ENCOUNTER — Ambulatory Visit (INDEPENDENT_AMBULATORY_CARE_PROVIDER_SITE_OTHER): Payer: Medicare Other | Admitting: Infectious Diseases

## 2013-05-10 ENCOUNTER — Encounter: Payer: Self-pay | Admitting: Infectious Diseases

## 2013-05-10 VITALS — BP 156/73 | HR 59 | Temp 98.2°F | Ht 64.0 in | Wt 160.0 lb

## 2013-05-10 DIAGNOSIS — Z96659 Presence of unspecified artificial knee joint: Secondary | ICD-10-CM

## 2013-05-10 DIAGNOSIS — T8459XA Infection and inflammatory reaction due to other internal joint prosthesis, initial encounter: Secondary | ICD-10-CM

## 2013-05-10 DIAGNOSIS — T8450XA Infection and inflammatory reaction due to unspecified internal joint prosthesis, initial encounter: Secondary | ICD-10-CM

## 2013-05-10 LAB — COMPREHENSIVE METABOLIC PANEL
ALK PHOS: 104 U/L (ref 39–117)
ALT: 9 U/L (ref 0–35)
AST: 16 U/L (ref 0–37)
Albumin: 4.2 g/dL (ref 3.5–5.2)
BUN: 15 mg/dL (ref 6–23)
CO2: 30 mEq/L (ref 19–32)
CREATININE: 1.06 mg/dL (ref 0.50–1.10)
Calcium: 9.8 mg/dL (ref 8.4–10.5)
Chloride: 100 mEq/L (ref 96–112)
Glucose, Bld: 92 mg/dL (ref 70–99)
Potassium: 4 mEq/L (ref 3.5–5.3)
SODIUM: 137 meq/L (ref 135–145)
TOTAL PROTEIN: 7.2 g/dL (ref 6.0–8.3)
Total Bilirubin: 0.4 mg/dL (ref 0.2–1.2)

## 2013-05-10 LAB — CBC
HCT: 39.9 % (ref 36.0–46.0)
Hemoglobin: 13.5 g/dL (ref 12.0–15.0)
MCH: 30.5 pg (ref 26.0–34.0)
MCHC: 33.8 g/dL (ref 30.0–36.0)
MCV: 90.1 fL (ref 78.0–100.0)
PLATELETS: 281 10*3/uL (ref 150–400)
RBC: 4.43 MIL/uL (ref 3.87–5.11)
RDW: 14.4 % (ref 11.5–15.5)
WBC: 6.7 10*3/uL (ref 4.0–10.5)

## 2013-05-10 LAB — C-REACTIVE PROTEIN: CRP: 1 mg/dL — AB (ref <0.60)

## 2013-05-10 NOTE — Assessment & Plan Note (Addendum)
Will plan on keeping her on kelfex until she has her joint replaced. Would consider repeat Cx at the time of her knee replacement. Would certainly consider giving her prolonged po anbx after she has 2nd replacement. Will be glad to see her in hospital when she has surgery.  I have also given her the names of ID MDs at [facility] as she and her daughter request.

## 2013-05-10 NOTE — Addendum Note (Signed)
Addended by: Verma Grothaus C on: 05/10/2013 04:08 PM   Modules accepted: Orders

## 2013-05-10 NOTE — Progress Notes (Signed)
   Subjective:    Patient ID: [name], female    DOB: 28-Mar-1926, 80 y.o.   MRN: [id number]  HPI 80 yo F with hx of R TKR 10-09. Her surgery was complicated by popliteal artery injury requiring patch. 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 She has had manipulation of her knee 04-2010, I & D/exchange of parts 03-09-2012. She had CoAg negative staph in her pre-operative Cx (R- pen). She was treated with ancef/rifampin then transitioned to keflex and rifampin. She again underwent manipulation of her knee 05-2012.  She was maintained on keflex/rifampin until 11-2012 when it was noted that her ESR and CRP were normal.  She saw ortho recently with swelling, pain and temp to 100. She had aspiration in office (Dr Durward Fortes) and was admitted on 03-15-13 and underwent removal of her prosthesis with placement of anbx spacer. Her Cx grew MSSE and she was d/c home on 12-24 on ancef.  She was seen in ID f/u on 1/13 and had ESR 45 and CRP 0.6. She was to complete her IV anbx on 04-27-13 and then be transitioned to po keflex.  Today her knee is a little sore because she had therapy. Has f/u with ortho on 05-19-13.   Review of Systems  Constitutional: Negative for fever, chills and appetite change.  Gastrointestinal: Negative for diarrhea and constipation.  Genitourinary: Negative for difficulty urinating.  Skin: Positive for rash.       Objective:   Physical Exam  Constitutional: She appears well-developed and well-nourished.  HENT:  Mouth/Throat: No oropharyngeal exudate.  Eyes: EOM are normal. Pupils are equal, round, and reactive to light.  Cardiovascular: Normal rate, regular rhythm and normal heart sounds.   Pulmonary/Chest: Effort normal and breath sounds normal.  Abdominal: Soft. Bowel sounds are normal. She exhibits no distension. There is no tenderness.  Musculoskeletal:       Legs:         Assessment & Plan:

## 2013-05-11 LAB — SEDIMENTATION RATE: SED RATE: 40 mm/h — AB (ref 0–22)

## 2013-05-16 ENCOUNTER — Telehealth: Payer: Self-pay | Admitting: *Deleted

## 2013-05-16 NOTE — Telephone Encounter (Signed)
RN shared results.  Daughter would appreciate lab results being sent to Dr. Durward Fortes for f/u visit on 05/19/13.  Office note and results faxed to Dr. Durward Fortes.

## 2013-05-18 ENCOUNTER — Encounter: Payer: Self-pay | Admitting: Family

## 2013-05-19 ENCOUNTER — Ambulatory Visit (INDEPENDENT_AMBULATORY_CARE_PROVIDER_SITE_OTHER): Payer: Medicare Other | Admitting: Family

## 2013-05-19 ENCOUNTER — Ambulatory Visit (HOSPITAL_COMMUNITY)
Admission: RE | Admit: 2013-05-19 | Discharge: 2013-05-19 | Disposition: A | Discharge status: Home / Self Care | Payer: Medicare Other | Source: Ambulatory Visit | Attending: Family | Admitting: Family

## 2013-05-19 ENCOUNTER — Telehealth: Payer: Self-pay | Admitting: Infectious Diseases

## 2013-05-19 ENCOUNTER — Encounter: Payer: Self-pay | Admitting: Family

## 2013-05-19 ENCOUNTER — Ambulatory Visit (INDEPENDENT_AMBULATORY_CARE_PROVIDER_SITE_OTHER)
Admission: RE | Admit: 2013-05-19 | Discharge: 2013-05-19 | Disposition: A | Discharge status: Home / Self Care | Payer: Medicare Other | Source: Ambulatory Visit | Attending: Vascular Surgery | Admitting: Vascular Surgery

## 2013-05-19 VITALS — BP 127/79 | HR 60 | Resp 16 | Ht 64.0 in | Wt 154.0 lb

## 2013-05-19 DIAGNOSIS — M791 Myalgia, unspecified site: Secondary | ICD-10-CM

## 2013-05-19 DIAGNOSIS — Z09 Encounter for follow-up examination after completed treatment for conditions other than malignant neoplasm: Secondary | ICD-10-CM | POA: Insufficient documentation

## 2013-05-19 DIAGNOSIS — S85001A Unspecified injury of popliteal artery, right leg, initial encounter: Secondary | ICD-10-CM

## 2013-05-19 DIAGNOSIS — Z48812 Encounter for surgical aftercare following surgery on the circulatory system: Secondary | ICD-10-CM

## 2013-05-19 DIAGNOSIS — IMO0001 Reserved for inherently not codable concepts without codable children: Secondary | ICD-10-CM

## 2013-05-19 DIAGNOSIS — S85009A Unspecified injury of popliteal artery, unspecified leg, initial encounter: Secondary | ICD-10-CM

## 2013-05-19 DIAGNOSIS — M79609 Pain in unspecified limb: Secondary | ICD-10-CM | POA: Insufficient documentation

## 2013-05-19 DIAGNOSIS — I739 Peripheral vascular disease, unspecified: Secondary | ICD-10-CM

## 2013-05-19 NOTE — Progress Notes (Signed)
[facility] [place] HISTORY AND PHYSICAL -PAD  History of Present Illness [name] is a 80 y.o. female patient of Dr. Bridgett Larsson who is s/p bovine patch angioplasty of iatrogenically injured right popliteal artery (Date: 01/16/10). Since that procedure her knee has gotten infected and required debridement and further intervention. The patient's symptoms have progressed due the right subsequent procedures. The patient's symptoms are: aching in right knee. The patient's treatment regimen currently included: maximal medical management and physical therapy. She returns today for surveillance of her right popliteal artery.  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 She remains on oral antibiotics after receiving IV antibx.  Is able to full weight bear on RLE, using a cane to ambulate. She denies non-healing wounds, is not able to walk very far. She denies any history of stroke or TIA.  The patient reports New Medical or Surgical History: had laser to each eye for narrow angle glaucoma.  Patient reports that she has little swelling in her right knee in the morning, and moore in the evening.  Pt Diabetic: No Pt smoker: non-smoker  Pt meds include: Statin :Yes  ASA: Yes Other anticoagulants/antiplatelets: Xaralto, for PAD, prescribed during her last hospital admission.  Past Medical History  Diagnosis Date  . Hypertension   . Thyroid disease   . Hiatal hernia   . Diverticulosis   . Complication of anesthesia   . PONV (postoperative nausea and vomiting)   . Hypothyroidism   . High cholesterol   . Chronic bronchitis     "yearly for the last 3-4 years" (03/16/2013)  . GERD (gastroesophageal reflux disease)   . Arthritis     "joints" (03/16/2013)  . Breast cancer   . Uterine cancer   . S/P knee surgery     right knee surgery on 12-18     Social History History  Substance Use Topics  . Smoking status: Never Smoker   . Smokeless tobacco: Never Used  . Alcohol Use: 0.0 oz/week     Comment: 03/16/2013 "rarely has a sip of wine; nothing for awhile now"    Family History Family History  Problem Relation Age of Onset  . Diabetes Mother   . Heart disease Father   . Stroke Father   . Hypertension Father   . Heart attack Father   . Cancer Sister   . Diabetes Sister   . Hyperlipidemia Sister   . Hypertension Sister   . Cancer Brother   . Diabetes Brother   . Hypertension Brother   . Hyperlipidemia Daughter     Past Surgical History  Procedure Laterality Date  . Total knee arthroplasty Left 2007  . Patch angioplasty Right 01/16/10    popliteal artery repair  . I&d knee with poly exchange  03/08/2012    Procedure: IRRIGATION AND DEBRIDEMENT KNEE WITH POLY EXCHANGE;  Surgeon: Garald Balding, MD;  Location: [facility];  Service: Orthopedics;  Laterality: Right;  Right Total Knee Irrigation & Debridement, Synovectomy, Poly Exchange, Placement of Antibiotic Beads  . Total knee  prosthesis removal w/ spacer insertion Right 03/15/2013  . Appendectomy    . Total knee arthroplasty Right 01/17/2011  . Mastectomy Left ~ 2005  . Breast biopsy Left   . Glaucoma surgery Bilateral 03/01/2013-03/08/2013  . Abdominal hysterectomy      "left an ovary" (03/16/2013)  . I&d extremity Right 03/15/2013  Procedure: Exploration right total knee replacement , removal of components, and insertion of antibiotic spacer.;  Surgeon: Garald Balding, MD;  Location: [facility];  Service: Orthopedics;  Laterality: Right;  . Joint replacement Right Dec. 10, 2013    Knee  . Joint replacement Right Dec. 17, 2014    Knee I & D    Allergies  Allergen Reactions  . Naproxen     bleeding stomach  . Nsaids     Bleeding stomach    Current Outpatient Prescriptions  Medication Sig Dispense Refill  . amLODipine (NORVASC) 10 MG tablet Take 1  tablet (10 mg total) by mouth daily.  30 tablet  0  . aspirin 81 MG tablet Take 81 mg by mouth daily.      . cephALEXin (KEFLEX) 500 MG capsule Take 1 capsule (500 mg total) by mouth 2 (two) times daily.  120 capsule  6  . Co-Enzyme Q10 200 MG CAPS Take 1 tablet by mouth daily.      Marland Kitchen docusate sodium (COLACE) 100 MG capsule Take 100 mg by mouth 2 (two) times daily.      . ergocalciferol (VITAMIN D2) 50000 UNITS capsule Take 50,000 Units by mouth every 30 (thirty) days. Take on the 1st day of each month.      . ferrous sulfate 325 (65 FE) MG tablet Take 1 tablet (325 mg total) by mouth 3 (three) times daily with meals.    3  . indapamide (LOZOL) 1.25 MG tablet Take 1 tablet (1.25 mg total) by mouth daily.  60 tablet  0  . levothyroxine (SYNTHROID, LEVOTHROID) 50 MCG tablet Take 50 mcg by mouth daily.      . Oxycodone HCl 10 MG TABS Take 10 mg by mouth every 4 (four) hours as needed (moderate to severe pain).      . polyethylene glycol (MIRALAX / GLYCOLAX) packet Take 17 g by mouth as needed.      . pravastatin (PRAVACHOL) 40 MG tablet Take 40 mg by mouth daily.      . rivaroxaban (XARELTO) 10 MG TABS tablet Take 1 tablet (10 mg total) by mouth daily.  12 tablet  0  . traMADol (ULTRAM) 50 MG tablet       . bisacodyl (DULCOLAX) 10 MG suppository Place 1 suppository (10 mg total) rectally daily as needed for moderate constipation.  12 suppository  0  . lactobacillus acidophilus (BACID) TABS tablet Take 2 tablets by mouth 2 (two) times daily.       No current facility-administered medications for this visit.    ROS: See HPI for pertinent positives and negatives.   Physical Examination  Filed Vitals:   05/19/13 1524  BP: 127/79  Pulse: 60  Resp: 16   Filed Weights   05/19/13 1524  Weight: 154 lb (69.854 kg)   Body mass index is 26.42 kg/(m^2).  General: A&O x 3, WDWN, . Gait: limp, using cane Eyes: PERRLA. Pulmonary: CTAB, without wheezes , rales or rhonchi. Cardiac: regular Rythm  , without detected murmur.         Carotid Bruits Left Right   Negative Negative  Aorta is not palpable. Radial pulses: are 2+ palpable and =                           VASCULAR EXAM: Extremities without ischemic changes  without Gangrene; without open wounds.  LE Pulses LEFT RIGHT       FEMORAL   palpable   palpable        POPLITEAL  not palpable    palpable       POSTERIOR TIBIAL   palpable    palpable        DORSALIS PEDIS      ANTERIOR TIBIAL  palpable   palpable    Abdomen: soft, NT, no masses. Skin: no rashes, no ulcers noted. Musculoskeletal: no muscle wasting or atrophy. Right knee is slightly swollen. Neurologic: A&O X 3; Appropriate Affect ; SENSATION: normal; MOTOR FUNCTION:  moving all extremities equally, motor strength 5/5 throughout. Speech is fluent/normal. CN 2-12 intact.  Non-Invasive Vascular Imaging: DATE: 05/19/2013 ABI: RIGHT 1.2, Waveforms: triphasic;  LEFT >1.3, Waveforms: triphasic  DUPLEX SCAN OF BYPASS:  LOWER EXTREMITY ARTERIAL DUPLEX EVALUATION    INDICATION: Injury to popliteal artery    PREVIOUS INTERVENTION(S): Patch angioplasty popliteal artery repair 01/22/2010    DUPLEX EXAM:     RIGHT  LEFT   Peak Systolic Velocity (cm/s) Ratio (if abnormal) Waveform  Peak Systolic Velocity (cm/s) Ratio (if abnormal) Waveform  149  T Common Femoral Artery     128  B Deep Femoral Artery     131  B Superficial Femoral Artery Proximal     167  T Superficial Femoral Artery Mid     138  T Superficial Femoral Artery Distal     134/90/226/ 95  T/T/T/T Popliteal Artery     119  B Posterior Tibial Artery Dist     85  B Anterior Tibial Artery Distal     39  B Peroneal Artery Distal     1.20/0.80 Today's ABI / TBI 1.39/1.19  1.20/1.42 Previous ABI / TBI (09/23/2012  ) 1.32/0.94    Waveform:    M - Monophasic       B - Biphasic       T - Triphasic  If  Ankle Brachial Index (ABI) or Toe Brachial Index (TBI) performed, please see complete report     ADDITIONAL FINDINGS:     IMPRESSION: Patent right popliteal artery with history of patch angioplasty.  There is a diameter change and velocity change present at the mid/distal popliteal artery which suggest a stenosis greater than 50% however no plaque is visualized.    Compared to the previous exam:  Stable ankle and toe brachial indices since previous study on 09/23/2012.     ASSESSMENT: Valeka Massar is a 80 y.o. female who is s/p bovine patch angioplasty of iatrogenically injured right popliteal artery (Date: 01/16/10). Since that procedure her knee has gotten infected and required debridement and further intervention. Patent right popliteal artery with history of patch angioplasty.  There is a diameter change and velocity change present at the mid/distal popliteal artery which suggest a stenosis greater than 50% however no plaque is visualized. Stable ankle and toe brachial indices since previous study on 09/23/2012.  PLAN:  Elevate legs above heart level when not walking to minimize right knee swelling. I discussed in depth with the patient the nature of atherosclerosis, and emphasized the importance of maximal medical management including strict control of blood pressure, blood glucose, and lipid levels, obtaining regular exercise, and continuedcessation of smoking.  The patient is aware that without maximal medical management the underlying atherosclerotic disease process will progress, limiting the benefit of any interventions.  Based on the patient's vascular studies and examination, and after discussing with Dr. Bridgett Larsson, pt will  return to clinic in 2 weeks to see Dr. Bridgett Larsson, specifically to ask him questions about the history of her right knee problems.  The patient was given information about PAD including signs, symptoms, treatment, what symptoms should prompt the patient to seek  immediate medical care, and risk reduction measures to take.  Clemon Chambers, RN, MSN, FNP-C [facility] Phone: [phone]  Clinic MD: Bridgett Larsson  05/19/2013 3:25 PM

## 2013-05-19 NOTE — Telephone Encounter (Signed)
Spoke with Dr Durward Fortes re: her anbx and possible future surgery. Will see her back in ~ 6 weeks to re-eval this due to her continued elevation in ESR.

## 2013-05-19 NOTE — Patient Instructions (Signed)
Peripheral Vascular Disease Peripheral Vascular Disease (PVD), also called Peripheral Arterial Disease (PAD), is a circulation problem caused by cholesterol (atherosclerotic plaque) deposits in the arteries. PVD commonly occurs in the lower extremities (legs) but it can occur in other areas of the body, such as your arms. The cholesterol buildup in the arteries reduces blood flow which can cause pain and other serious problems. The presence of PVD can place a person at risk for Coronary Artery Disease (CAD).  CAUSES  Causes of PVD can be many. It is usually associated with more than one risk factor such as:   High Cholesterol.  Smoking.  Diabetes.  Lack of exercise or inactivity.  High blood pressure (hypertension).  Obesity.  Family history. SYMPTOMS   When the lower extremities are affected, patients with PVD may experience:  Leg pain with exertion or physical activity. This is called INTERMITTENT CLAUDICATION. This may present as cramping or numbness with physical activity. The location of the pain is associated with the level of blockage. For example, blockage at the abdominal level (distal abdominal aorta) may result in buttock or hip pain. Lower leg arterial blockage may result in calf pain.  As PVD becomes more severe, pain can develop with less physical activity.  In people with severe PVD, leg pain may occur at rest.  Other PVD signs and symptoms:  Leg numbness or weakness.  Coldness in the affected leg or foot, especially when compared to the other leg.  A change in leg color.  Patients with significant PVD are more prone to ulcers or sores on toes, feet or legs. These may take longer to heal or may reoccur. The ulcers or sores can become infected.  If signs and symptoms of PVD are ignored, gangrene may occur. This can result in the loss of toes or loss of an entire limb.  Not all leg pain is related to PVD. Other medical conditions can cause leg pain such  as:  Blood clots (embolism) or Deep Vein Thrombosis.  Inflammation of the blood vessels (vasculitis).  Spinal stenosis. DIAGNOSIS  Diagnosis of PVD can involve several different types of tests. These can include:  Pulse Volume Recording Method (PVR). This test is simple, painless and does not involve the use of X-rays. PVR involves measuring and comparing the blood pressure in the arms and legs. An ABI (Ankle-Brachial Index) is calculated. The normal ratio of blood pressures is 1. As this number becomes smaller, it indicates more severe disease.  < 0.95  indicates significant narrowing in one or more leg vessels.  <0.8 there will usually be pain in the foot, leg or buttock with exercise.  <0.4 will usually have pain in the legs at rest.  <0.25  usually indicates limb threatening PVD.  Doppler detection of pulses in the legs. This test is painless and checks to see if you have a pulses in your legs/feet.  A dye or contrast material (a substance that highlights the blood vessels so they show up on x-ray) may be given to help your caregiver better see the arteries for the following tests. The dye is eliminated from your body by the kidney's. Your caregiver may order blood work to check your kidney function and other laboratory values before the following tests are performed:  Magnetic Resonance Angiography (MRA). An MRA is a picture study of the blood vessels and arteries. The MRA machine uses a large magnet to produce images of the blood vessels.  Computed Tomography Angiography (CTA). A CTA is a   specialized x-ray that looks at how the blood flows in your blood vessels. An IV may be inserted into your arm so contrast dye can be injected.  Angiogram. Is a procedure that uses x-rays to look at your blood vessels. This procedure is minimally invasive, meaning a small incision (cut) is made in your groin. A small tube (catheter) is then inserted into the artery of your groin. The catheter is  guided to the blood vessel or artery your caregiver wants to examine. Contrast dye is injected into the catheter. X-rays are then taken of the blood vessel or artery. After the images are obtained, the catheter is taken out. TREATMENT  Treatment of PVD involves many interventions which may include:  Lifestyle changes:  Quitting smoking.  Exercise.  Following a low fat, low cholesterol diet.  Control of diabetes.  Foot care is very important to the PVD patient. Good foot care can help prevent infection.  Medication:  Cholesterol-lowering medicine.  Blood pressure medicine.  Anti-platelet drugs.  Certain medicines may reduce symptoms of Intermittent Claudication.  Interventional/Surgical options:  Angioplasty. An Angioplasty is a procedure that inflates a balloon in the blocked artery. This opens the blocked artery to improve blood flow.  Stent Implant. A wire mesh tube (stent) is placed in the artery. The stent expands and stays in place, allowing the artery to remain open.  Peripheral Bypass Surgery. This is a surgical procedure that reroutes the blood around a blocked artery to help improve blood flow. This type of procedure may be performed if Angioplasty or stent implants are not an option. SEEK IMMEDIATE MEDICAL CARE IF:   You develop pain or numbness in your arms or legs.  Your arm or leg turns cold, becomes blue in color.  You develop redness, warmth, swelling and pain in your arms or legs. MAKE SURE YOU:   Understand these instructions.  Will watch your condition.  Will get help right away if you are not doing well or get worse. Document Released: 04/23/2004 Document Revised: 06/08/2011 Document Reviewed: 03/20/2008 Exit[employer] Patient Information 2014 [employer].  

## 2013-05-30 ENCOUNTER — Other Ambulatory Visit: Payer: Self-pay | Admitting: Infectious Diseases

## 2013-05-30 DIAGNOSIS — M009 Pyogenic arthritis, unspecified: Secondary | ICD-10-CM

## 2013-06-08 ENCOUNTER — Encounter: Payer: Self-pay | Admitting: Vascular Surgery

## 2013-06-09 ENCOUNTER — Ambulatory Visit (INDEPENDENT_AMBULATORY_CARE_PROVIDER_SITE_OTHER): Payer: Medicare Other | Admitting: Vascular Surgery

## 2013-06-09 ENCOUNTER — Encounter: Payer: Self-pay | Admitting: Vascular Surgery

## 2013-06-09 VITALS — BP 164/62 | HR 59 | Resp 18 | Ht 64.0 in | Wt 156.0 lb

## 2013-06-09 DIAGNOSIS — S85009A Unspecified injury of popliteal artery, unspecified leg, initial encounter: Secondary | ICD-10-CM

## 2013-06-09 DIAGNOSIS — Z48812 Encounter for surgical aftercare following surgery on the circulatory system: Secondary | ICD-10-CM

## 2013-06-09 NOTE — Progress Notes (Signed)
Established Right Popliteal Patch Angioplasty  History of Present Illness  [name] is a 80 y.o. (03/20/1926) female s/p repair of R popliteal artery after iatrogenic injury of the R popliteal artery during a R TKR who presents with chief complaint: questions about repair.  Previous operation(s) complete on 01/16/10 include: patch angioplasty of R popliteal artery.  The patient has been asx from her artery repair.  She has never had rest pain or intermittent claudication.  Unfortunately, the R TKR has gotten infected twice, leading to removal of all hardware.  The patient's treatment regimen currently included: maximal medical management.  The patient's PMH, PSH, SH, FamHx, Med, and Allergies are unchanged from 05/19/13.  On ROS today: right knee swelling, joint pain, no foot pain or ulcer  Physical Examination  Filed Vitals:   06/09/13 1520  BP: 164/62  Pulse: 59  Resp: 18  Height: 5\' 4"  (1.626 m)  Weight: 156 lb (70.761 kg)   Body mass index is 26.76 kg/(m^2).  Physical Examination  Filed Vitals:   06/09/13 1520  BP: 164/62  Pulse: 59  Resp: 18  Height: 5\' 4"  (1.626 m)  Weight: 156 lb (70.761 kg)   Body mass index is 26.76 kg/(m^2).  General: A&O x 3, WDWN  Pulmonary: Sym exp, good air movt, CTAB, no rales, rhonchi, & wheezing  Cardiac: RRR, Nl S1, S2, no Murmurs, rubs or gallops  Vascular: Vessel Right Left  Radial Palpable Palpable  Brachial Palpable Palpable  Carotid Palpable, without bruit Palpable, without bruit  Aorta Not palpable N/A  Femoral Palpable Palpable  Popliteal Not palpable Not palpable  PT Palpable Palpable  DP Palpable Palpable   Gastrointestinal: soft, NTND, -G/R, - HSM, - masses, - CVAT B  Musculoskeletal: M/S 5/5 throughout , Extremities without ischemic changes , R TKR incision healed, swollen R knee  Neurologic: Pain and light touch intact in extremities   Non-Invasive Vascular Imaging ABI (Date: 05/19/13) R: 1.2, DP: tri,  PT: tri L: 1.4, DP: tri, PT: tri  RLE arterial duplex (Date: 06/09/2013) R pop: 134 c/s to 90 c/s/ to 226 c/s , lumen at narrowest 4.7 mm  Medical Decision Making  [name] is a 80 y.o. female who presents with: s/p repair of iatrogenic popliteal artery injury with bovine patch angioplasty of right popliteal artery .  I suspected the arterial duplex findings are due to vessel diameter changes +/- change in orientation of the artery rather than true atherosclerotic stenosis.    Regardless with a residual 4.7 mm arterial lumen, no intervention is planned.  The patient had multiple concerns given the recurrent infections in the R knee.  I discuss with the patient resistant nature of the bovine patch to infection.  We also discussed that if the patch would become infected it likely would present with pseudoaneurysm and possible rupture, neither of which is evident.  Based on the patient's vascular studies and examination, I have offered the patient: annual BLE ABI and right arterial duplex  I discussed in depth with the patient the nature of atherosclerosis, and emphasized the importance of maximal medical management including strict control of blood pressure, blood glucose, and lipid levels, obtaining regular exercise, and cessation of smoking.  The patient is aware that without maximal medical management the underlying atherosclerotic disease process will progress, limiting the benefit of any interventions.  I discussed in depth with the patient the need for long term surveillance to improve the primary assisted patency of his bypass.  The patient  agrees to cooperate with such.  The patient is scheduled for the previous listed surveillance studies in 12 months.  Thank you for allowing [place] to participate in this patient's care.  Adele Barthel, MD [facility] [place] [facility] Office: [phone] Pager: [phone]  06/09/2013, 4:42 PM

## 2013-06-12 NOTE — Addendum Note (Signed)
Addended by: Mena Goes on: 06/12/2013 04:08 PM   Modules accepted: Orders

## 2013-06-13 ENCOUNTER — Other Ambulatory Visit: Payer: Medicare Other

## 2013-06-13 DIAGNOSIS — M009 Pyogenic arthritis, unspecified: Secondary | ICD-10-CM

## 2013-06-13 LAB — C-REACTIVE PROTEIN: CRP: <0.5 mg/dL (ref <0.60)

## 2013-06-14 LAB — SEDIMENTATION RATE: Sed Rate: 14 mm/hr (ref 0–22)

## 2013-06-19 ENCOUNTER — Encounter: Payer: Self-pay | Admitting: Infectious Diseases

## 2013-06-19 ENCOUNTER — Ambulatory Visit (INDEPENDENT_AMBULATORY_CARE_PROVIDER_SITE_OTHER): Payer: Medicare Other | Admitting: Infectious Diseases

## 2013-06-19 ENCOUNTER — Telehealth: Payer: Self-pay | Admitting: *Deleted

## 2013-06-19 VITALS — BP 161/71 | HR 61 | Temp 98.0°F | Ht 64.0 in | Wt 156.0 lb

## 2013-06-19 DIAGNOSIS — T8450XA Infection and inflammatory reaction due to unspecified internal joint prosthesis, initial encounter: Secondary | ICD-10-CM

## 2013-06-19 DIAGNOSIS — I10 Essential (primary) hypertension: Secondary | ICD-10-CM

## 2013-06-19 NOTE — Progress Notes (Signed)
   Subjective:    Patient ID: [name], female    DOB: February 21, 1926, 80 y.o.   MRN: [id number]  HPI 80 yo F with hx of R TKR 10-09. Her surgery was complicated by popliteal artery injury requiring patch. 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 She has had manipulation of her knee 04-2010, I & D/exchange of parts 03-09-2012. She had CoAg negative staph in her pre-operative Cx (R- pen). She was treated with ancef/rifampin then transitioned to keflex and rifampin. She again underwent manipulation of her knee 05-2012.  She was maintained on keflex/rifampin until 11-2012 when it was noted that her ESR and CRP were normal.  She saw ortho recently with swelling, pain and temp to 100. She had aspiration in office (Dr Durward Fortes) and was admitted on 03-15-13 and underwent removal of her prosthesis with placement of anbx spacer. Her Cx grew MSSE and she was d/c home on 12-24 on ancef. She was seen in ID f/u on 1/13 and had ESR 45 and CRP 0.6. She completed her IV anbx on 04-27-13 and then transitioned to po keflex. She is still having swelling and warmth in her knee. Has pain and swelling when she is on her leg too much (either sitting too long or walking a lot).   Still taking keflex. No problems with this.  Her surgery for her knee is pending, has appt this afternoon.  Her CRP and ESR were repeated on 06-13-13 (ESR 14 and CRP <0.5).   Review of Systems  Constitutional: Negative for fever and chills.  Gastrointestinal: Negative for diarrhea.  Musculoskeletal: Positive for arthralgias and joint swelling.  Skin: Negative for rash.       Objective:   Physical Exam  Constitutional: She appears well-developed and well-nourished.  Musculoskeletal:       Legs:         Assessment & Plan:

## 2013-06-19 NOTE — Telephone Encounter (Signed)
Patient's daughter, Helene Kelp wanted to let Dr. Johnnye Sima know that she is scheduled for her knee replacement surgery on 07/18/13 with Dr. Durward Fortes. Myrtis Hopping

## 2013-06-19 NOTE — Assessment & Plan Note (Signed)
She appears to be doing fairly well today. Her inflammatory markers are improved. Will defer to her orthopedist regarding timing her new knee. Will keep her on anbx at least until she has surgery, possibly after depending on what is found in OR. Will see her back in 3-4 months if she has not had surgery before that.

## 2013-06-19 NOTE — Assessment & Plan Note (Addendum)
She feels like this is due to having suasage for breakfast and going to [facility] today. Has been running in the 140s at home with her home health aide. She has had f/u with her PCP

## 2013-06-28 ENCOUNTER — Ambulatory Visit: Payer: Medicare Other | Admitting: Infectious Diseases

## 2013-07-12 ENCOUNTER — Encounter (HOSPITAL_COMMUNITY): Payer: Self-pay | Admitting: Pharmacy Technician

## 2013-07-15 NOTE — Pre-Procedure Instructions (Signed)
[place] - Preparing for Surgery  Before surgery, you can play an important role.  Because skin is not sterile, your skin needs to be as free of germs as possible.  You can reduce the number of germs on you skin by washing with CHG (chlorahexidine gluconate) soap before surgery.  CHG is an antiseptic cleaner which kills germs and bonds with the skin to continue killing germs even after washing.  Please DO NOT use if you have an allergy to CHG or antibacterial soaps.  If your skin becomes reddened/irritated stop using the CHG and inform your nurse when you arrive at Short Stay.  Do not shave (including legs and underarms) for at least 48 hours prior to the first CHG shower.  You may shave your face.  Please follow these instructions carefully:   1.  Shower with CHG Soap the night before surgery and the morning of Surgery.  2.  If you choose to wash your hair, wash your hair first as usual with your normal shampoo.  3.  After you shampoo, rinse your hair and body thoroughly to remove the shampoo.  4.  Use CHG as you would any other liquid soap.  You can apply CHG directly to the skin and wash gently with scrungie or a clean washcloth.  5.  Apply the CHG Soap to your body ONLY FROM THE NECK DOWN.  Do not use on open wounds or open sores.  Avoid contact with your eyes, ears, mouth and genitals (private parts).  Wash genitals (private parts) with your normal soap.  6.  Wash thoroughly, paying special attention to the area where your surgery will be performed.  7.  Thoroughly rinse your body with warm water from the neck down.  8.  DO NOT shower/wash with your normal soap after using and rinsing off the CHG Soap.  9.  Pat yourself dry with a clean towel.            10.  Wear clean pajamas.            11.  Place clean sheets on your bed the night of your first shower and do not sleep with pets.  Day of Surgery  Do not apply any lotions the morning of surgery.  Please wear clean clothes to the  hospital/surgery center.   

## 2013-07-15 NOTE — Pre-Procedure Instructions (Signed)
[name]  07/15/2013   Your procedure is scheduled on:  April 28  Report to [facility] Entrance "A" [address] at [employer] AM.  Call this number if you have problems the morning of surgery: [phone]   Remember:   Do not eat food or drink liquids after midnight.   Take these medicines the morning of surgery with A SIP OF WATER: Amlodipine, Keflex, Levothyroxine, Lorazepam (if needed), Oxycodone or tramadol (if needed),   STOP Aspirin, Co-Q10, Vitamin D2, April 21   STOP/ Do not take Aspirin, Aleve, Naproxen, Advil, Ibuprofen, Vitamin, Herbs, or Supplements starting April 21   Do not wear jewelry, make-up or nail polish.  Do not wear lotions, powders, or perfumes. You may wear deodorant.  Do not shave 48 hours prior to surgery. Men may shave face and neck.  Do not bring valuables to the hospital.  [facility] is not responsible for any belongings or valuables.               Contacts, dentures or bridgework may not be worn into surgery.  Leave suitcase in the car. After surgery it may be brought to your room.  For patients admitted to the hospital, discharge time is determined by your treatment team.               Special Instructions: See [facility] Preparing For Surgery   Please read over the following fact sheets that you were given: Pain Booklet, Coughing and Deep Breathing, Blood Transfusion Information and Surgical Site Infection Prevention

## 2013-07-17 ENCOUNTER — Encounter (HOSPITAL_COMMUNITY)
Admission: RE | Admit: 2013-07-17 | Discharge: 2013-07-17 | Disposition: A | Discharge status: Home / Self Care | Payer: Medicare Other | Source: Ambulatory Visit | Attending: Orthopaedic Surgery | Admitting: Orthopaedic Surgery

## 2013-07-17 ENCOUNTER — Encounter (HOSPITAL_COMMUNITY): Payer: Self-pay

## 2013-07-17 DIAGNOSIS — Z01812 Encounter for preprocedural laboratory examination: Secondary | ICD-10-CM | POA: Insufficient documentation

## 2013-07-17 HISTORY — DX: Anxiety disorder, unspecified: F41.9

## 2013-07-17 HISTORY — DX: Bronchitis, not specified as acute or chronic: J40

## 2013-07-17 LAB — URINALYSIS, ROUTINE W REFLEX MICROSCOPIC
Bilirubin Urine: NEGATIVE
GLUCOSE, UA: NEGATIVE mg/dL
Hgb urine dipstick: NEGATIVE
Ketones, ur: NEGATIVE mg/dL
Nitrite: NEGATIVE
PH: 6 (ref 5.0–8.0)
Protein, ur: NEGATIVE mg/dL
Specific Gravity, Urine: 1.016 (ref 1.005–1.030)
Urobilinogen, UA: 0.2 mg/dL (ref 0.0–1.0)

## 2013-07-17 LAB — COMPREHENSIVE METABOLIC PANEL
ALT: 15 U/L (ref 0–35)
AST: 18 U/L (ref 0–37)
Albumin: 3.7 g/dL (ref 3.5–5.2)
Alkaline Phosphatase: 105 U/L (ref 39–117)
BUN: 15 mg/dL (ref 6–23)
CO2: 26 meq/L (ref 19–32)
Calcium: 9.3 mg/dL (ref 8.4–10.5)
Chloride: 101 mEq/L (ref 96–112)
Creatinine, Ser: 1.07 mg/dL (ref 0.50–1.10)
GFR, EST AFRICAN AMERICAN: 53 mL/min — AB (ref >90)
GFR, EST NON AFRICAN AMERICAN: 46 mL/min — AB (ref >90)
Glucose, Bld: 74 mg/dL (ref 70–99)
POTASSIUM: 3.9 meq/L (ref 3.7–5.3)
SODIUM: 141 meq/L (ref 137–147)
Total Bilirubin: 0.3 mg/dL (ref 0.3–1.2)
Total Protein: 7.9 g/dL (ref 6.0–8.3)

## 2013-07-17 LAB — CBC WITH DIFFERENTIAL/PLATELET
Basophils Absolute: 0 10*3/uL (ref 0.0–0.1)
Basophils Relative: 0 % (ref 0–1)
EOS PCT: 1 % (ref 0–5)
Eosinophils Absolute: 0.1 10*3/uL (ref 0.0–0.7)
HCT: 47.7 % — ABNORMAL HIGH (ref 36.0–46.0)
HEMOGLOBIN: 16.6 g/dL — AB (ref 12.0–15.0)
LYMPHS ABS: 1.7 10*3/uL (ref 0.7–4.0)
LYMPHS PCT: 24 % (ref 12–46)
MCH: 31 pg (ref 26.0–34.0)
MCHC: 34.8 g/dL (ref 30.0–36.0)
MCV: 89 fL (ref 78.0–100.0)
Monocytes Absolute: 0.4 10*3/uL (ref 0.1–1.0)
Monocytes Relative: 5 % (ref 3–12)
NEUTROS PCT: 70 % (ref 43–77)
Neutro Abs: 5.1 10*3/uL (ref 1.7–7.7)
PLATELETS: 233 10*3/uL (ref 150–400)
RBC: 5.36 MIL/uL — AB (ref 3.87–5.11)
RDW: 13.6 % (ref 11.5–15.5)
WBC: 7.3 10*3/uL (ref 4.0–10.5)

## 2013-07-17 LAB — URINE MICROSCOPIC-ADD ON

## 2013-07-17 LAB — TYPE AND SCREEN
ABO/RH(D): A POS
Antibody Screen: NEGATIVE

## 2013-07-17 LAB — SURGICAL PCR SCREEN
MRSA, PCR: NEGATIVE
Staphylococcus aureus: NEGATIVE

## 2013-07-17 LAB — PROTIME-INR
INR: 0.96 (ref 0.00–1.49)
PROTHROMBIN TIME: 12.6 s (ref 11.6–15.2)

## 2013-07-17 LAB — APTT: aPTT: 30 seconds (ref 24–37)

## 2013-07-17 MED ORDER — CHLORHEXIDINE GLUCONATE 4 % EX LIQD: 60.0000 mL | Freq: Every day | CUTANEOUS | Status: DC

## 2013-07-18 LAB — URINE CULTURE
COLONY COUNT: NO GROWTH
CULTURE: NO GROWTH

## 2013-07-25 ENCOUNTER — Encounter (HOSPITAL_COMMUNITY): Admission: RE | Payer: Self-pay | Source: Ambulatory Visit

## 2013-07-25 ENCOUNTER — Inpatient Hospital Stay (HOSPITAL_COMMUNITY): Admission: RE | Admit: 2013-07-25 | Payer: Medicare Other | Source: Ambulatory Visit | Admitting: Orthopaedic Surgery

## 2013-07-25 SURGERY — TOTAL KNEE REVISION
Anesthesia: General | Site: Knee | Laterality: Right

## 2013-08-09 ENCOUNTER — Ambulatory Visit: Payer: Medicare Other | Admitting: Infectious Disease

## 2013-09-05 ENCOUNTER — Encounter: Payer: Self-pay | Admitting: Infectious Disease

## 2013-09-05 ENCOUNTER — Ambulatory Visit (INDEPENDENT_AMBULATORY_CARE_PROVIDER_SITE_OTHER): Payer: Medicare Other | Admitting: Infectious Disease

## 2013-09-05 VITALS — BP 144/72 | HR 55 | Temp 97.7°F | Wt 158.0 lb

## 2013-09-05 DIAGNOSIS — M009 Pyogenic arthritis, unspecified: Secondary | ICD-10-CM

## 2013-09-05 DIAGNOSIS — T8450XA Infection and inflammatory reaction due to unspecified internal joint prosthesis, initial encounter: Secondary | ICD-10-CM

## 2013-09-05 DIAGNOSIS — B957 Other staphylococcus as the cause of diseases classified elsewhere: Secondary | ICD-10-CM

## 2013-09-05 LAB — C-REACTIVE PROTEIN

## 2013-09-05 LAB — BASIC METABOLIC PANEL WITH GFR
BUN: 14 mg/dL (ref 6–23)
CO2: 28 mEq/L (ref 19–32)
Calcium: 9.2 mg/dL (ref 8.4–10.5)
Chloride: 102 mEq/L (ref 96–112)
Creat: 1.09 mg/dL (ref 0.50–1.10)
GFR, EST AFRICAN AMERICAN: 53 mL/min — AB
GFR, EST NON AFRICAN AMERICAN: 46 mL/min — AB
Glucose, Bld: 81 mg/dL (ref 70–99)
POTASSIUM: 4.6 meq/L (ref 3.5–5.3)
Sodium: 140 mEq/L (ref 135–145)

## 2013-09-05 LAB — SEDIMENTATION RATE: SED RATE: 11 mm/h (ref 0–22)

## 2013-09-05 NOTE — Progress Notes (Signed)
   Subjective:    Patient ID: [name], female    DOB: [birth year], 80 y.o.   MRN: [id number]  HPI  80 yo F with hx of R TKR 10-09. Her surgery was complicated by popliteal artery injury requiring patch. 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 She has had manipulation of her knee 04-2010, I & D/exchange of parts 03-09-2012. She had CoAg negative staph in her pre-operative Cx (R- pen). She was treated with ancef/rifampin then transitioned to keflex and rifampin. She again underwent manipulation of her knee 05-2012.  She was maintained on keflex/rifampin until 11-2012 when it was noted that her ESR and CRP were normal.  She saw ortho recently with swelling, pain and temp to 100. She had aspiration in office (Dr Durward Fortes) and was admitted on 03-15-13 and underwent removal of her prosthesis with placement of anbx spacer. Her Cx grew MSSE and she was d/c home on 12-24 on ancef. She was seen in ID f/u on 1/13 and had ESR 45 and CRP 0.6. She completed her IV anbx on 04-27-13 and then transitioned to po keflex. She is still having swelling and warmth in her knee. Has pain and swelling when she is on her leg too much (either sitting too long or walking a lot).   Still taking keflex. No problems with this.   Her CRP and ESR were repeated on 06-13-13 (ESR 14 and CRP <0.5).   She saw Dr. Johnnye Sima several times this spring. Plan was for stent antibiotics through her surgery and potentially after surgery. 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 The planned joint replacement surgery was postponed again by the patient due to her anxiety about potential infection. She states right now that she has pain with bearing weight but at rest no pain whatsoever in her right knee does stay warm all the time but the swelling has not worsened she also alluded effusion chronically there  Review of Systems  Constitutional: Negative  for fever and chills.  Gastrointestinal: Negative for diarrhea.  Musculoskeletal: Positive for arthralgias and joint swelling.  Skin: Negative for rash.       Objective:   Physical Exam  Constitutional: She is oriented to person, place, and time. She appears well-developed and well-nourished.  HENT:  Head: Normocephalic and atraumatic.  Eyes: EOM are normal.  Neck: Normal range of motion.  Cardiovascular: Normal rate and regular rhythm.   Pulmonary/Chest: No respiratory distress. She has no wheezes.  Abdominal: Soft. She exhibits no distension.  Musculoskeletal:       Legs: Neurological: She is alert and oriented to person, place, and time.  Skin: Skin is warm and dry.  Psychiatric: She has a normal mood and affect. Her behavior is normal. Judgment and thought content normal.          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  Continue her on her Keflex for now. She will consider whether or not she'll have surgery not will plan on seeing her back in 3 months time also recheck her inflammatory markers namely her sedimentation rate and C-reactive protein today.

## 2013-09-20 ENCOUNTER — Ambulatory Visit: Payer: Medicare Other | Admitting: Infectious Disease

## 2013-11-29 ENCOUNTER — Ambulatory Visit (INDEPENDENT_AMBULATORY_CARE_PROVIDER_SITE_OTHER): Payer: Medicare Other | Admitting: Infectious Disease

## 2013-11-29 ENCOUNTER — Encounter: Payer: Self-pay | Admitting: Infectious Disease

## 2013-11-29 VITALS — BP 155/89 | HR 54 | Temp 98.7°F | Wt 161.0 lb

## 2013-11-29 DIAGNOSIS — Z23 Encounter for immunization: Secondary | ICD-10-CM

## 2013-11-29 DIAGNOSIS — T889XXS Complication of surgical and medical care, unspecified, sequela: Secondary | ICD-10-CM

## 2013-11-29 DIAGNOSIS — Z96659 Presence of unspecified artificial knee joint: Secondary | ICD-10-CM

## 2013-11-29 DIAGNOSIS — T8459XS Infection and inflammatory reaction due to other internal joint prosthesis, sequela: Secondary | ICD-10-CM

## 2013-11-29 DIAGNOSIS — M009 Pyogenic arthritis, unspecified: Secondary | ICD-10-CM

## 2013-11-29 LAB — CBC WITH DIFFERENTIAL/PLATELET
BASOS PCT: 1 % (ref 0–1)
Basophils Absolute: 0.1 10*3/uL (ref 0.0–0.1)
EOS ABS: 0.1 10*3/uL (ref 0.0–0.7)
Eosinophils Relative: 2 % (ref 0–5)
HCT: 43.6 % (ref 36.0–46.0)
Hemoglobin: 15.5 g/dL — ABNORMAL HIGH (ref 12.0–15.0)
Lymphocytes Relative: 32 % (ref 12–46)
Lymphs Abs: 1.9 10*3/uL (ref 0.7–4.0)
MCH: 31.4 pg (ref 26.0–34.0)
MCHC: 35.6 g/dL (ref 30.0–36.0)
MCV: 88.3 fL (ref 78.0–100.0)
Monocytes Absolute: 0.4 10*3/uL (ref 0.1–1.0)
Monocytes Relative: 6 % (ref 3–12)
NEUTROS PCT: 59 % (ref 43–77)
Neutro Abs: 3.5 10*3/uL (ref 1.7–7.7)
PLATELETS: 216 10*3/uL (ref 150–400)
RBC: 4.94 MIL/uL (ref 3.87–5.11)
RDW: 13.9 % (ref 11.5–15.5)
WBC: 6 10*3/uL (ref 4.0–10.5)

## 2013-11-29 NOTE — Progress Notes (Signed)
   Subjective:    Patient ID: Lenore Cordia, female    DOB: [birth year], 80 y.o.   MRN: [id number]  HPI  80 yo F with hx of R TKR 10-09. Her surgery was complicated by popliteal artery injury requiring patch. 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 She has had manipulation of her knee 04-2010, I & D/exchange of parts 03-09-2012. She had CoAg negative staph in her pre-operative Cx (R- pen). She was treated with ancef/rifampin then transitioned to keflex and rifampin. She again underwent manipulation of her knee 05-2012.  She was maintained on keflex/rifampin until 11-2012 when it was noted that her ESR and CRP were normal.  She saw ortho recently with swelling, pain and temp to 100. She had aspiration in office (Dr Durward Fortes) and was admitted on 03-15-13 and underwent removal of her prosthesis with placement of anbx spacer. Her Cx grew MSSE and she was d/c home on 12-24 on ancef. She was seen in ID f/u on 1/13 and had ESR 45 and CRP 0.6. She completed her IV anbx on 04-27-13 and then transitioned to po keflex. She is still having swelling and warmth in her knee. Has pain and swelling when she is on her leg too much (either sitting too long or walking a lot).   Still taking keflex. No problems with this.   Her CRP and ESR were repeated on 06-13-13 (ESR 14 and CRP <0.5).   She saw Dr. Johnnye Sima several times in spring of 2013.   Her surgeon was quite worried about risk of recurrent infection as is the patient. She has remained on oral Keflex since we last saw her. Knee largely hurts when she bears weight but not at rest. She was able to bowl recently     Review of Systems  Constitutional: Negative for fever and chills.  Gastrointestinal: Negative for diarrhea.  Musculoskeletal: Positive for arthralgias and joint swelling.  Skin: Negative for rash.       Objective:   Physical Exam  Constitutional: She is oriented to person, place, and time. She appears well-developed and  well-nourished.  HENT:  Head: Normocephalic and atraumatic.  Eyes: EOM are normal.  Neck: Normal range of motion.  Cardiovascular: Normal rate and regular rhythm.   Pulmonary/Chest: No respiratory distress. She has no wheezes.  Abdominal: Soft. She exhibits no distension.  Musculoskeletal:       Legs: Neurological: She is alert and oriented to person, place, and time.  Skin: Skin is warm and dry.  Psychiatric: She has a normal mood and affect. Her behavior is normal. Judgment and thought content normal.          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  Continue her on her Keflex for now. Recheck ESR, CRP  RTC in 4 months  Need for flu vaccination: give flu shot

## 2013-11-30 LAB — BASIC METABOLIC PANEL WITH GFR
BUN: 14 mg/dL (ref 6–23)
CALCIUM: 9.2 mg/dL (ref 8.4–10.5)
CO2: 28 mEq/L (ref 19–32)
Chloride: 101 mEq/L (ref 96–112)
Creat: 1.17 mg/dL — ABNORMAL HIGH (ref 0.50–1.10)
GFR, EST AFRICAN AMERICAN: 49 mL/min — AB
GFR, Est Non African American: 42 mL/min — ABNORMAL LOW
Glucose, Bld: 72 mg/dL (ref 70–99)
Potassium: 4.2 mEq/L (ref 3.5–5.3)
SODIUM: 139 meq/L (ref 135–145)

## 2013-11-30 LAB — C-REACTIVE PROTEIN: CRP: <0.5 mg/dL (ref <0.60)

## 2013-11-30 LAB — SEDIMENTATION RATE: Sed Rate: 8 mm/hr (ref 0–22)

## 2013-11-30 NOTE — Progress Notes (Signed)
I let her know.

## 2013-12-06 ENCOUNTER — Ambulatory Visit: Payer: Medicare Other | Admitting: Infectious Disease

## 2014-04-04 ENCOUNTER — Ambulatory Visit (INDEPENDENT_AMBULATORY_CARE_PROVIDER_SITE_OTHER): Payer: Medicare Other | Admitting: Infectious Disease

## 2014-04-04 ENCOUNTER — Encounter: Payer: Self-pay | Admitting: Infectious Disease

## 2014-04-04 DIAGNOSIS — Z96659 Presence of unspecified artificial knee joint: Principal | ICD-10-CM

## 2014-04-04 DIAGNOSIS — T8450XD Infection and inflammatory reaction due to unspecified internal joint prosthesis, subsequent encounter: Secondary | ICD-10-CM

## 2014-04-04 DIAGNOSIS — T8459XD Infection and inflammatory reaction due to other internal joint prosthesis, subsequent encounter: Secondary | ICD-10-CM

## 2014-04-04 LAB — CBC WITH DIFFERENTIAL/PLATELET
Basophils Absolute: 0 10*3/uL (ref 0.0–0.1)
Basophils Relative: 0 % (ref 0–1)
Eosinophils Absolute: 0.2 10*3/uL (ref 0.0–0.7)
Eosinophils Relative: 3 % (ref 0–5)
HCT: 43.9 % (ref 36.0–46.0)
Hemoglobin: 15.3 g/dL — ABNORMAL HIGH (ref 12.0–15.0)
LYMPHS ABS: 1.6 10*3/uL (ref 0.7–4.0)
Lymphocytes Relative: 28 % (ref 12–46)
MCH: 30.6 pg (ref 26.0–34.0)
MCHC: 34.9 g/dL (ref 30.0–36.0)
MCV: 87.8 fL (ref 78.0–100.0)
MPV: 10 fL (ref 8.6–12.4)
Monocytes Absolute: 0.3 10*3/uL (ref 0.1–1.0)
Monocytes Relative: 6 % (ref 3–12)
Neutro Abs: 3.7 10*3/uL (ref 1.7–7.7)
Neutrophils Relative %: 63 % (ref 43–77)
Platelets: 209 10*3/uL (ref 150–400)
RBC: 5 MIL/uL (ref 3.87–5.11)
RDW: 14.4 % (ref 11.5–15.5)
WBC: 5.8 10*3/uL (ref 4.0–10.5)

## 2014-04-04 LAB — BASIC METABOLIC PANEL WITH GFR
BUN: 14 mg/dL (ref 6–23)
CO2: 27 meq/L (ref 19–32)
CREATININE: 0.97 mg/dL (ref 0.50–1.10)
Calcium: 8.9 mg/dL (ref 8.4–10.5)
Chloride: 101 mEq/L (ref 96–112)
GFR, Est African American: 61 mL/min
GFR, Est Non African American: 53 mL/min — ABNORMAL LOW
Glucose, Bld: 86 mg/dL (ref 70–99)
Potassium: 4 mEq/L (ref 3.5–5.3)
SODIUM: 137 meq/L (ref 135–145)

## 2014-04-04 LAB — C-REACTIVE PROTEIN

## 2014-04-04 NOTE — Progress Notes (Signed)
 Subjective:    Patient ID: [name], female    DOB: 06/16/1925, 81 y.o.   MRN: [id number]  HPI  81 yo F with hx of R TKR 10-09. Her surgery was complicated by popliteal artery injury requiring patch. 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 She has had manipulation of her knee 04-2010, I & D/exchange of parts 03-09-2012. She had CoAg negative staph in her pre-operative Cx (R- pen). She was treated with ancef/rifampin then transitioned to keflex and rifampin. She again underwent manipulation of her knee 05-2012.  She was maintained on keflex/rifampin until 11-2012 when it was noted that her ESR and CRP were normal.  She saw ortho recently with swelling, pain and temp to 100. She had aspiration in office (Dr Whitfield) and was admitted on 03-15-13 and underwent removal of her prosthesis with placement of anbx spacer. Her Cx grew MSSE and she was d/c home on 12-24 on ancef. She was seen in ID f/u on 1/13 and had ESR 45 and CRP 0.6. She completed her IV anbx on 04-27-13 and then transitioned to po keflex. She is still having swelling and warmth in her knee. Has pain and swelling when she is on her leg too much (either sitting too long or walking a lot).   Still taking keflex. No problems with this.   Her CRP and ESR were repeated on 06-13-13 (ESR 14 and CRP <0.5).   She saw Dr. Hatcher several times in spring of 2013.   Her surgeon was quite worried about risk of recurrent infection as is the patient. She has remained on oral Keflex since we last saw her. Knee largely hurts when she bears weight but not at rest. She was able to bowl recently     Review of Systems  Constitutional: Negative for fever, chills, diaphoresis, activity change, appetite change, fatigue and unexpected weight change.  HENT: Negative for congestion, rhinorrhea, sinus pressure, sneezing, sore throat and trouble swallowing.   Eyes: Negative for photophobia and visual disturbance.  Respiratory: Negative for  cough, chest tightness, shortness of breath, wheezing and stridor.   Cardiovascular: Negative for chest pain, palpitations and leg swelling.  Gastrointestinal: Negative for nausea, vomiting, abdominal pain, diarrhea, constipation, blood in stool, abdominal distention and anal bleeding.  Genitourinary: Negative for dysuria, hematuria, flank pain and difficulty urinating.  Musculoskeletal: Positive for joint swelling and arthralgias. Negative for myalgias, back pain and gait problem.  Skin: Negative for color change, pallor, rash and wound.  Neurological: Negative for dizziness, tremors, weakness and light-headedness.  Hematological: Negative for adenopathy. Does not bruise/bleed easily.  Psychiatric/Behavioral: Negative for behavioral problems, confusion, sleep disturbance, dysphoric mood, decreased concentration and agitation.       Objective:   Physical Exam  Constitutional: She is oriented to person, place, and time. She appears well-developed and well-nourished. No distress.  HENT:  Head: Normocephalic and atraumatic.  Mouth/Throat: No oropharyngeal exudate.  Eyes: Conjunctivae and EOM are normal. No scleral icterus.  Neck: Normal range of motion. Neck supple.  Cardiovascular: Normal rate and regular rhythm.   Pulmonary/Chest: Effort normal. No respiratory distress. She has no wheezes.  Abdominal: She exhibits no distension.  Musculoskeletal: She exhibits no edema or tenderness.  Neurological: She is alert and oriented to person, place, and time. She exhibits normal muscle tone. Coordination normal.  Skin: Skin is warm and dry. No rash noted. She is not diaphoretic. No erythema. No pallor.  Psychiatric: She has a normal mood and affect. Her behavior is normal.   Judgment and thought content normal.    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  Continue her on her Keflex for now. Recheck ESR, CRP bmp and CBC c diff  RTC in 1 year

## 2014-04-05 LAB — SEDIMENTATION RATE: Sed Rate: 6 mm/hr (ref 0–22)

## 2014-04-18 ENCOUNTER — Telehealth: Payer: Self-pay | Admitting: *Deleted

## 2014-04-18 NOTE — Telephone Encounter (Signed)
RN discussed the pt's most recent results.  Pt's hemaglobin was slightly elevated and the GFR was normal for an African American.  RN questioned the pt about her fluid intake.  Pt approximated that she drank 32 oz / day.  RN advised the pt to increase her daily fluid intake during the waking hours to keep her kidneys healthy.  Pt verbalized understanding and agreed to increase her fluid intake.

## 2014-04-18 NOTE — Telephone Encounter (Signed)
Thanks Denise! 

## 2014-06-13 ENCOUNTER — Encounter: Payer: Self-pay | Admitting: Family

## 2014-06-14 ENCOUNTER — Ambulatory Visit (INDEPENDENT_AMBULATORY_CARE_PROVIDER_SITE_OTHER)
Admission: RE | Admit: 2014-06-14 | Discharge: 2014-06-14 | Disposition: A | Discharge status: Home / Self Care | Payer: Medicare Other | Source: Ambulatory Visit | Attending: Vascular Surgery | Admitting: Vascular Surgery

## 2014-06-14 ENCOUNTER — Ambulatory Visit (HOSPITAL_COMMUNITY)
Admission: RE | Admit: 2014-06-14 | Discharge: 2014-06-14 | Disposition: A | Discharge status: Home / Self Care | Payer: Medicare Other | Source: Ambulatory Visit | Attending: Family | Admitting: Family

## 2014-06-14 ENCOUNTER — Ambulatory Visit (INDEPENDENT_AMBULATORY_CARE_PROVIDER_SITE_OTHER): Payer: Medicare Other | Admitting: Family

## 2014-06-14 ENCOUNTER — Encounter: Payer: Self-pay | Admitting: Family

## 2014-06-14 VITALS — BP 145/78 | HR 56 | Resp 16 | Ht 64.0 in | Wt 162.0 lb

## 2014-06-14 DIAGNOSIS — I10 Essential (primary) hypertension: Secondary | ICD-10-CM | POA: Diagnosis not present

## 2014-06-14 DIAGNOSIS — S85001D Unspecified injury of popliteal artery, right leg, subsequent encounter: Secondary | ICD-10-CM | POA: Diagnosis not present

## 2014-06-14 DIAGNOSIS — E785 Hyperlipidemia, unspecified: Secondary | ICD-10-CM | POA: Insufficient documentation

## 2014-06-14 DIAGNOSIS — I739 Peripheral vascular disease, unspecified: Secondary | ICD-10-CM | POA: Diagnosis not present

## 2014-06-14 DIAGNOSIS — Z48812 Encounter for surgical aftercare following surgery on the circulatory system: Secondary | ICD-10-CM | POA: Insufficient documentation

## 2014-06-14 DIAGNOSIS — S85009A Unspecified injury of popliteal artery, unspecified leg, initial encounter: Secondary | ICD-10-CM

## 2014-06-14 NOTE — Progress Notes (Signed)
[facility] [place] HISTORY AND PHYSICAL -PAD  History of Present Illness [name] is a 81 y.o. female  patient of Dr. Bridgett Orozco who is s/p bovine patch angioplasty of iatrogenically injured right popliteal artery (Date: 01/16/10). Since that procedure her knee has gotten infected and required debridement and further intervention in December 2014.  She has less aching in right knee.  The patient's treatment regimen currently included: maximal medical management and physical therapy. She returns today for surveillance of her right popliteal artery.  She had emergent right knee surgery on 03/15/2013 at [facility] for reinfection of the right total knee replacement, artificial joint was removed and she has a spacer in place. She remains on oral antibiotics after receiving IV antibx.  Is able to full weight bear on RLE, using a cane to ambulate. She denies non-healing wounds, is not able to walk very far, she does enjoy bowling. She denies any history of stroke or TIA.  The patient denies New Medical or Surgical History.  Patient reports that she has little swelling in her right knee in the morning, and more in the evening.  Pt Diabetic: No Pt smoker: non-smoker  Pt meds include: Statin :Yes Antibiotic: Keflex, remains on this, states when antibx stopped in September 2015, had recurrence of right knee infection by December 2015 ASA: Yes Other anticoagulants/antiplatelets: [name] was stopped in 2015   Past Medical History  Diagnosis Date  . Hypertension   . Thyroid disease   . Hiatal hernia   . Diverticulosis   . Complication of anesthesia   . PONV (postoperative nausea and vomiting)   . Hypothyroidism   . High cholesterol   . Chronic bronchitis     "yearly for the last 3-4 years" (03/16/2013)  . GERD (gastroesophageal reflux disease)   . Arthritis     "joints" (03/16/2013)  . Breast cancer   . Uterine cancer   . S/P knee surgery     right knee surgery on  12-18  . Bronchitis   . Anxiety     Social History History  Substance Use Topics  . Smoking status: Never Smoker   . Smokeless tobacco: Never Used  . Alcohol Use: No    Family History Family History  Problem Relation Age of Onset  . Diabetes Mother   . Heart disease Father   . Stroke Father   . Hypertension Father   . Heart attack Father   . Cancer Sister   . Diabetes Sister   . Hyperlipidemia Sister   . Hypertension Sister   . Cancer Brother   . Diabetes Brother   . Hypertension Brother   . Hyperlipidemia Daughter     Past Surgical History  Procedure Laterality Date  . Total knee arthroplasty Left 2007  . Patch angioplasty Right 01/16/10    popliteal artery repair  . I&d knee with poly exchange  03/08/2012    Procedure: IRRIGATION AND DEBRIDEMENT KNEE WITH POLY EXCHANGE;  Surgeon: Megan Balding, MD;  Location: [place] [facility];  Service: Orthopedics;  Laterality: Right;  Right Total Knee Irrigation & Debridement, Synovectomy, Poly Exchange, Placement of Antibiotic Beads  . Total knee  prosthesis removal w/ spacer insertion Right 03/15/2013  . Appendectomy    . Total knee arthroplasty Right 01/17/2011  . Mastectomy Left ~ 2005  . Breast biopsy Left   . Glaucoma surgery Bilateral 03/01/2013-03/08/2013  . Abdominal hysterectomy      "left an ovary" (03/16/2013)  . I&d extremity Right 03/15/2013  Procedure: Exploration right total knee replacement , removal of components, and insertion of antibiotic spacer.;  Surgeon: Megan Balding, MD;  Location: [place];  Service: Orthopedics;  Laterality: Right;  . Joint replacement Right Dec. 10, 2013    Knee  . Joint replacement Right Dec. 17, 2014    Knee I & D  . Eye surgery      Allergies  Allergen Reactions  . Naproxen     bleeding stomach  . Nsaids     Bleeding stomach    Current Outpatient Prescriptions  Medication Sig Dispense Refill  . amLODipine (NORVASC) 5 MG tablet Take 5 mg by mouth daily.    Marland Kitchen aspirin 81 MG  tablet Take 81 mg by mouth daily.    . cephALEXin (KEFLEX) 500 MG capsule Take 1 capsule (500 mg total) by mouth 2 (two) times daily. 120 capsule 6  . Co-Enzyme Q10 200 MG CAPS Take 1 tablet by mouth daily.    Marland Kitchen docusate sodium (COLACE) 100 MG capsule Take 100 mg by mouth 2 (two) times daily.    . ergocalciferol (VITAMIN D2) 50000 UNITS capsule Take 50,000 Units by mouth every 30 (thirty) days. Take on the 1st day of each month.    . indapamide (LOZOL) 1.25 MG tablet Take 1.25 mg by mouth every other day.    . levothyroxine (SYNTHROID, LEVOTHROID) 50 MCG tablet Take 50 mcg by mouth daily.    Marland Kitchen LORazepam (ATIVAN) 0.5 MG tablet Take 0.5 mg by mouth every 8 (eight) hours as needed for anxiety.     . Magnesium 250 MG TABS Take 1 tablet by mouth.    . Magnesium Hydroxide (MILK OF MAGNESIA PO) Take 30 mLs by mouth daily as needed (constipation).    . Oxycodone HCl 10 MG TABS Take 10 mg by mouth daily as needed (moderate to severe pain).     . pravastatin (PRAVACHOL) 40 MG tablet Take 40 mg by mouth daily.    . traMADol (ULTRAM) 50 MG tablet Take 50 mg by mouth every 6 (six) hours as needed for moderate pain.     . ferrous sulfate 325 (65 FE) MG tablet Take 1 tablet (325 mg total) by mouth 3 (three) times daily with meals. (Patient not taking: Reported on 06/14/2014)  3   No current facility-administered medications for this visit.    ROS: See HPI for pertinent positives and negatives.   Physical Examination  Filed Vitals:   06/14/14 1537  BP: 145/78  Pulse: 56  Resp: 16  Height: 5\' 4"  (1.626 m)  Weight: 162 lb (73.483 kg)   Body mass index is 27.79 kg/(m^2).  General: A&O x 3, WDWN . Gait: limp, using cane Eyes: PERRLA. Pulmonary: CTAB, without wheezes , rales or rhonchi. Cardiac: regular Rythm , without detected murmur.     Carotid Bruits Left Right   Negative Negative  Aorta is not palpable. Radial pulses: are 2+ palpable and =   VASCULAR  EXAM: Extremities without ischemic changes  without Gangrene; without open wounds.     LE Pulses LEFT RIGHT   FEMORAL  palpable  palpable    POPLITEAL not palpable   palpable   POSTERIOR TIBIAL  palpable   palpable    DORSALIS PEDIS  ANTERIOR TIBIAL palpable  palpable    Abdomen: soft, NT, no palpable masses. Skin: no rashes, no ulcers. Musculoskeletal: no muscle wasting or atrophy. Right knee is slightly swollen. Neurologic: A&O X 3; Appropriate Affect ; SENSATION: normal; MOTOR FUNCTION:  moving all extremities equally, motor strength 5/5 throughout. Speech is fluent/normal. CN 2-12 intact.         Non-Invasive Vascular Imaging: DATE: 06/14/2014 LOWER EXTREMITY ARTERIAL DUPLEX EVALUATION    INDICATION: Injury to popliteal artery    PREVIOUS INTERVENTION(S): Right popliteal artery patch angioplasty and repair on 01/22/2010.    DUPLEX EXAM:     RIGHT  LEFT   Peak Systolic Velocity (cm/s) Ratio (if abnormal) Waveform  Peak Systolic Velocity (cm/s) Ratio (if abnormal) Waveform  159  T Common Femoral Artery     102  T Deep Femoral Artery     145  T Superficial Femoral Artery Proximal     143  T Superficial Femoral Artery Mid     134  T Superficial Femoral Artery Distal     125/159/90  T/T/B Popliteal Artery     107  B Posterior Tibial Artery Dist     54  B Anterior Tibial Artery Distal     65  B Peroneal Artery Distal     1.19/0.75 Today's ABI / TBI 1.27/1.10  1.20/0.80 Previous ABI / TBI (05/19/2013  ) 1.39/1.19    Waveform:    M - Monophasic       B - Biphasic       T - Triphasic  If Ankle Brachial Index (ABI) or Toe Brachial Index (TBI) performed, please see complete report     ADDITIONAL FINDINGS:     IMPRESSION: Patent right lower extremity arterial system, no  hemodynamically significant stenosis or plaque visualized.    Compared to the previous exam:  Bilateral ankle and toe brachial indices are stable since previous study on 05/19/2013.      ASSESSMENT: [name] is a 81 y.o. female  who is s/p bovine patch angioplasty of iatrogenically injured right popliteal artery (Date: 01/16/10). Since that procedure her knee has gotten infected and required debridement and further intervention in December 2014.  She is able to walk with occasional difficulty, remains on antibiotic to prevent recurrence of right knee infection. She has no claudication symptoms, no tissue loss. Today's right LE arterial Duplex reveals a patent right lower extremity arterial system, no hemodynamically significant stenosis or plaque visualized. Bilateral ankle and toe brachial indices are stable since previous study on 05/19/2013. ABI's have triphasic waveforms throughout.    PLAN:  I discussed in depth with the patient the nature of atherosclerosis, and emphasized the importance of maximal medical management including strict control of blood pressure, blood glucose, and lipid levels, obtaining regular exercise, and continued cessation of smoking.  The patient is aware that without maximal medical management the underlying atherosclerotic disease process will progress, limiting the benefit of any interventions.  Based on the patient's vascular studies and examination, pt will return to clinic in 1 year for ABI's and right LE arterial Duplex.   The patient was given information about PAD including signs, symptoms, treatment, what symptoms should prompt the patient to seek immediate medical care, and risk reduction measures to take.  Clemon Chambers, RN, MSN, FNP-C [facility] [employer] Phone: [phone]  Clinic MD: Oneida Alar on call  06/14/2014 3:53 PM

## 2014-06-14 NOTE — Patient Instructions (Signed)

## 2014-06-15 ENCOUNTER — Other Ambulatory Visit (HOSPITAL_COMMUNITY): Payer: Medicare Other

## 2014-06-15 ENCOUNTER — Encounter (HOSPITAL_COMMUNITY): Payer: Medicare Other

## 2014-06-15 ENCOUNTER — Ambulatory Visit: Payer: Medicare Other | Admitting: Family

## 2014-06-15 NOTE — Addendum Note (Signed)
Addended by: Thresa Ross C on: 06/15/2014 10:54 AM   Modules accepted: Orders

## 2014-09-24 ENCOUNTER — Other Ambulatory Visit: Payer: Self-pay

## 2015-06-10 ENCOUNTER — Encounter: Payer: Self-pay | Admitting: Vascular Surgery

## 2015-06-14 ENCOUNTER — Other Ambulatory Visit: Payer: Self-pay | Admitting: Family

## 2015-06-14 ENCOUNTER — Encounter: Payer: Self-pay | Admitting: Family

## 2015-06-14 ENCOUNTER — Ambulatory Visit (HOSPITAL_COMMUNITY)
Admission: RE | Admit: 2015-06-14 | Discharge: 2015-06-14 | Disposition: A | Discharge status: Home / Self Care | Payer: Medicare Other | Source: Ambulatory Visit | Attending: Family | Admitting: Family

## 2015-06-14 ENCOUNTER — Ambulatory Visit (INDEPENDENT_AMBULATORY_CARE_PROVIDER_SITE_OTHER)
Admission: RE | Admit: 2015-06-14 | Discharge: 2015-06-14 | Disposition: A | Discharge status: Home / Self Care | Payer: Medicare Other | Source: Ambulatory Visit | Attending: Family | Admitting: Family

## 2015-06-14 ENCOUNTER — Ambulatory Visit (INDEPENDENT_AMBULATORY_CARE_PROVIDER_SITE_OTHER): Payer: Medicare Other | Admitting: Family

## 2015-06-14 VITALS — BP 172/73 | HR 54 | Temp 97.6°F | Resp 16

## 2015-06-14 DIAGNOSIS — E7800 Pure hypercholesterolemia, unspecified: Secondary | ICD-10-CM | POA: Insufficient documentation

## 2015-06-14 DIAGNOSIS — S85001D Unspecified injury of popliteal artery, right leg, subsequent encounter: Secondary | ICD-10-CM

## 2015-06-14 DIAGNOSIS — K219 Gastro-esophageal reflux disease without esophagitis: Secondary | ICD-10-CM | POA: Diagnosis not present

## 2015-06-14 DIAGNOSIS — I70201 Unspecified atherosclerosis of native arteries of extremities, right leg: Secondary | ICD-10-CM | POA: Insufficient documentation

## 2015-06-14 DIAGNOSIS — I10 Essential (primary) hypertension: Secondary | ICD-10-CM | POA: Insufficient documentation

## 2015-06-14 DIAGNOSIS — I739 Peripheral vascular disease, unspecified: Secondary | ICD-10-CM | POA: Diagnosis present

## 2015-06-14 NOTE — Progress Notes (Signed)
[facility] [place] HISTORY AND PHYSICAL -PAD  History of Present Illness [name] is a 82 y.o. female patient of Dr. Bridgett Larsson who is s/p bovine patch angioplasty of iatrogenically injured right popliteal artery (Date: 01/16/10). Since that procedure her knee has gotten infected and required debridement and further intervention in December 2014. She has less aching in right knee. The patient's treatment regimen currently included: maximal medical management and physical therapy. She returns today for surveillance of her right popliteal artery.  She had emergent right knee surgery on 03/15/2013 at 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 She remains on oral antibiotics after receiving IV antibx.  Is able to full weight bear on RLE, using a cane to ambulate. She denies non-healing wounds, is not able to walk very far, she does enjoy bowling. She denies any history of stroke or TIA.  The patient denies New Medical or Surgical History.  Patient reports that she has little swelling in her right knee in the morning, and more in the evening.  Pt Diabetic: No Pt smoker: non-smoker  Pt meds include: Statin :Yes Antibiotic: Keflex, remains on this, states when antibx stopped in September 2015, had recurrence of right knee infection by December 2015 ASA: Yes Other anticoagulants/antiplatelets: [name] was stopped in 2015      Past Medical History  Diagnosis Date  . Hypertension   . Thyroid disease   . Hiatal hernia   . Diverticulosis   . Complication of anesthesia   . PONV (postoperative nausea and vomiting)   . Hypothyroidism   . High cholesterol   . Chronic bronchitis     "yearly for the last 3-4 years" (03/16/2013)  . GERD (gastroesophageal reflux disease)   . Arthritis     "joints" (03/16/2013)  . Breast cancer   . Uterine cancer   . S/P knee surgery     right knee surgery on  12-18  . Bronchitis   . Anxiety     Social History Social History  Substance Use Topics  . Smoking status: Never Smoker   . Smokeless tobacco: Never Used  . Alcohol Use: No    Family History Family History  Problem Relation Age of Onset  . Diabetes Mother   . Heart disease Father   . Stroke Father   . Hypertension Father   . Heart attack Father   . Cancer Sister   . Diabetes Sister   . Hyperlipidemia Sister   . Hypertension Sister   . Cancer Brother   . Diabetes Brother   . Hypertension Brother   . Hyperlipidemia Daughter     Past Surgical History  Procedure Laterality Date  . Total knee arthroplasty Left 2007  . Patch angioplasty Right 01/16/10    popliteal artery repair  . I&d knee with poly exchange  03/08/2012    Procedure: IRRIGATION AND DEBRIDEMENT KNEE WITH POLY EXCHANGE;  Surgeon: Garald Balding, MD;  Location: [place];  Service: Orthopedics;  Laterality: Right;  Right Total Knee Irrigation & Debridement, Synovectomy, Poly Exchange, Placement of Antibiotic Beads  . Total knee  prosthesis removal w/ spacer insertion Right 03/15/2013  . Appendectomy    . Total knee arthroplasty Right 01/17/2011  . Mastectomy Left ~ 2005  . Breast biopsy Left   . Glaucoma surgery Bilateral 03/01/2013-03/08/2013  . Abdominal hysterectomy      "left an ovary" (03/16/2013)  . I&d extremity Right 03/15/2013  Procedure: Exploration right total knee replacement , removal of components, and insertion of antibiotic spacer.;  Surgeon: Garald Balding, MD;  Location: [place];  Service: Orthopedics;  Laterality: Right;  . Joint replacement Right Dec. 10, 2013    Knee  . Joint replacement Right Dec. 17, 2014    Knee I & D  . Eye surgery      Allergies  Allergen Reactions  . Naproxen     bleeding stomach  . Nsaids     Bleeding stomach    Current Outpatient Prescriptions  Medication Sig Dispense Refill  . amLODipine (NORVASC) 5 MG tablet Take 5 mg by mouth daily.    Marland Kitchen  aspirin 81 MG tablet Take 81 mg by mouth daily.    . cephALEXin (KEFLEX) 500 MG capsule Take 1 capsule (500 mg total) by mouth 2 (two) times daily. 120 capsule 6  . Co-Enzyme Q10 200 MG CAPS Take 1 tablet by mouth daily.    Marland Kitchen docusate sodium (COLACE) 100 MG capsule Take 100 mg by mouth 2 (two) times daily.    . ergocalciferol (VITAMIN D2) 50000 UNITS capsule Take 50,000 Units by mouth every 30 (thirty) days. Take on the 1st day of each month.    . ferrous sulfate 325 (65 FE) MG tablet Take 1 tablet (325 mg total) by mouth 3 (three) times daily with meals. (Patient not taking: Reported on 06/14/2014)  3  . indapamide (LOZOL) 1.25 MG tablet Take 1.25 mg by mouth every other day.    . levothyroxine (SYNTHROID, LEVOTHROID) 50 MCG tablet Take 50 mcg by mouth daily.    Marland Kitchen LORazepam (ATIVAN) 0.5 MG tablet Take 0.5 mg by mouth every 8 (eight) hours as needed for anxiety.     . Magnesium 250 MG TABS Take 1 tablet by mouth.    . Magnesium Hydroxide (MILK OF MAGNESIA PO) Take 30 mLs by mouth daily as needed (constipation).    . Oxycodone HCl 10 MG TABS Take 10 mg by mouth daily as needed (moderate to severe pain).     . pravastatin (PRAVACHOL) 40 MG tablet Take 40 mg by mouth daily.    . traMADol (ULTRAM) 50 MG tablet Take 50 mg by mouth every 6 (six) hours as needed for moderate pain.      No current facility-administered medications for this visit.    ROS: See HPI for pertinent positives and negatives.   Physical Examination  Filed Vitals:   06/14/15 1300 06/14/15 1304  BP: 170/74 172/73  Pulse: 54 54  Temp: 97.6 F (36.4 C)   TempSrc: Oral   Resp: 16   SpO2: 98%    There is no weight on file to calculate BMI.    General: A&O x 3, WDWN . Gait: limp, using cane Eyes: PERRLA. Pulmonary: CTAB, without wheezes , rales or rhonchi. Cardiac: regular Rythm , without detected murmur.     Carotid Bruits Left Right   Negative Negative  Aorta is not palpable. Radial pulses:  are 2+ palpable and =   VASCULAR EXAM: Extremities without ischemic changes  without Gangrene; without open wounds.     LE Pulses LEFT RIGHT   FEMORAL  palpable  palpable    POPLITEAL not palpable   palpable   POSTERIOR TIBIAL  palpable   palpable    DORSALIS PEDIS  ANTERIOR TIBIAL palpable  palpable    Abdomen: soft, NT, no palpable masses. Skin: no rashes, no ulcers. Musculoskeletal: no muscle wasting or atrophy. Right knee is  slightly swollen. Neurologic: A&O X 3; Appropriate Affect ; SENSATION: normal; MOTOR FUNCTION: moving all extremities equally, motor strength 5/5 throughout. Speech is fluent/normal. CN 2-12 intact.                Non-Invasive Vascular Imaging: DATE: 06/14/2015 ABI: RIGHT: 1.21 (1.19, 06/14/14), Waveforms: triphasic;  LEFT: 1.24 (1.27), Waveforms: triphasic  Right LE arterial Duplex: Approximately 50% focal stenosis of right popliteal artery. Remainder of RLE is widely patent. All triphasic waveforms from right femoral to ankle.  ASSESSMENT: [name] is a 82 y.o. female who is s/p bovine patch angioplasty of iatrogenically injured right popliteal artery (Date: 01/16/10). Since that procedure her knee has gotten infected and required debridement and further intervention in December 2014.  She is able to walk with occasional difficulty, remains on antibiotic to prevent recurrence of right knee infection. She has no claudication symptoms, no signs of ischemia in her feet/legs.  Today's right LE arterial Duplex suggests approximately 50% focal stenosis of right popliteal artery. Remainder of RLE is widely patent. All triphasic waveforms from right femoral to ankle. Bilateral ABI's remain normal with triphasic waveforms.    I advised pt to see her PCP as soon as possible re her elevated blood pressure.    PLAN:  Daily seated and standing leg exercises as demonstrated and discussed. She cannot walk much due to right knee issues.  Based on the patient's vascular studies and examination, pt will return to clinic in 1 year with ABI's and and right LE arterial Duplex.   I discussed in depth with the patient the nature of atherosclerosis, and emphasized the importance of maximal medical management including strict control of blood pressure, blood glucose, and lipid levels, obtaining regular exercise, and continued cessation of smoking.  The patient is aware that without maximal medical management the underlying atherosclerotic disease process will progress, limiting the benefit of any interventions.  The patient was given information about PAD including signs, symptoms, treatment, what symptoms should prompt the patient to seek immediate medical care, and risk reduction measures to take.  Clemon Chambers, RN, MSN, FNP-C [facility] Phone: [phone]  Clinic MD: Bridgett Larsson  06/14/2015 1:03 PM

## 2015-06-14 NOTE — Patient Instructions (Signed)
Peripheral Vascular Disease Peripheral vascular disease (PVD) is a disease of the blood vessels that are not part of your heart and brain. A simple term for PVD is poor circulation. In most cases, PVD narrows the blood vessels that carry blood from your heart to the rest of your body. This can result in a decreased supply of blood to your arms, legs, and internal organs, like your stomach or kidneys. However, it most often affects a person's lower legs and feet. There are two types of PVD.  Organic PVD. This is the more common type. It is caused by damage to the structure of blood vessels.  Functional PVD. This is caused by conditions that make blood vessels contract and tighten (spasm). Without treatment, PVD tends to get worse over time. PVD can also lead to acute ischemic limb. This is when an arm or limb suddenly has trouble getting enough blood. This is a medical emergency. CAUSES Each type of PVD has many different causes. The most common cause of PVD is buildup of a fatty material (plaque) inside of your arteries (atherosclerosis). Small amounts of plaque can break off from the walls of the blood vessels and become lodged in a smaller artery. This blocks blood flow and can cause acute ischemic limb. Other common causes of PVD include:  Blood clots that form inside of blood vessels.  Injuries to blood vessels.  Diseases that cause inflammation of blood vessels or cause blood vessel spasms.  Health behaviors and health history that increase your risk of developing PVD. RISK FACTORS  You may have a greater risk of PVD if you:  Have a family history of PVD.  Have certain medical conditions, including:  High cholesterol.  Diabetes.  High blood pressure (hypertension).  Coronary heart disease.  Past problems with blood clots.  Past injury, such as burns or a broken bone. These may have damaged blood vessels in your limbs.  Buerger disease. This is caused by inflamed blood  vessels in your hands and feet.  Some forms of arthritis.  Rare birth defects that affect the arteries in your legs.  Use tobacco.  Do not get enough exercise.  Are obese.  Are age 50 or older. SIGNS AND SYMPTOMS  PVD may cause many different symptoms. Your symptoms depend on what part of your body is not getting enough blood. Some common signs and symptoms include:  Cramps in your lower legs. This may be a symptom of poor leg circulation (claudication).  Pain and weakness in your legs while you are physically active that goes away when you rest (intermittent claudication).  Leg pain when at rest.  Leg numbness, tingling, or weakness.  Coldness in a leg or foot, especially when compared with the other leg.  Skin or hair changes. These can include:  Hair loss.  Shiny skin.  Pale or bluish skin.  Thick toenails.  Inability to get or maintain an erection (erectile dysfunction). People with PVD are more prone to developing ulcers and sores on their toes, feet, or legs. These may take longer than normal to heal. DIAGNOSIS Your health care provider may diagnose PVD from your signs and symptoms. The health care provider will also do a physical exam. You may have tests to find out what is causing your PVD and determine its severity. Tests may include:  Blood pressure recordings from your arms and legs and measurements of the strength of your pulses (pulse volume recordings).  Imaging studies using sound waves to take pictures of   the blood flow through your blood vessels (Doppler ultrasound).  Injecting a dye into your blood vessels before having imaging studies using:  X-rays (angiogram or arteriogram).  Computer-generated X-rays (CT angiogram).  A powerful electromagnetic field and a computer (magnetic resonance angiogram or MRA). TREATMENT Treatment for PVD depends on the cause of your condition and the severity of your symptoms. It also depends on your age. Underlying  causes need to be treated and controlled. These include long-lasting (chronic) conditions, such as diabetes, high cholesterol, and high blood pressure. You may need to first try making lifestyle changes and taking medicines. Surgery may be needed if these do not work. Lifestyle changes may include:  Quitting smoking.  Exercising regularly.  Following a low-fat, low-cholesterol diet. Medicines may include:  Blood thinners to prevent blood clots.  Medicines to improve blood flow.  Medicines to improve your blood cholesterol levels. Surgical procedures may include:  A procedure that uses an inflated balloon to open a blocked artery and improve blood flow (angioplasty).  A procedure to put in a tube (stent) to keep a blocked artery open (stent implant).  Surgery to reroute blood flow around a blocked artery (peripheral bypass surgery).  Surgery to remove dead tissue from an infected wound on the affected limb.  Amputation. This is surgical removal of the affected limb. This may be necessary in cases of acute ischemic limb that are not improved through medical or surgical treatments. HOME CARE INSTRUCTIONS  Take medicines only as directed by your health care provider.  Do not use any tobacco products, including cigarettes, chewing tobacco, or electronic cigarettes. If you need help quitting, ask your health care provider.  Lose weight if you are overweight, and maintain a healthy weight as directed by your health care provider.  Eat a diet that is low in fat and cholesterol. If you need help, ask your health care provider.  Exercise regularly. Ask your health care provider to suggest some good activities for you.  Use compression stockings or other mechanical devices as directed by your health care provider.  Take good care of your feet.  Wear comfortable shoes that fit well.  Check your feet often for any cuts or sores. SEEK MEDICAL CARE IF:  You have cramps in your legs  while walking.  You have leg pain when you are at rest.  You have coldness in a leg or foot.  Your skin changes.  You have erectile dysfunction.  You have cuts or sores on your feet that are not healing. SEEK IMMEDIATE MEDICAL CARE IF:  Your arm or leg turns cold and blue.  Your arms or legs become red, warm, swollen, painful, or numb.  You have chest pain or trouble breathing.  You suddenly have weakness in your face, arm, or leg.  You become very confused or lose the ability to speak.  You suddenly have a very bad headache or lose your vision.   This information is not intended to replace advice given to you by your health care provider. Make sure you discuss any questions you have with your health care provider.   Document Released: 04/23/2004 Document Revised: 04/06/2014 Document Reviewed: 08/24/2013 Elsevier Interactive Patient Education 2016 [employer] Inc[employer]  

## 2016-06-08 ENCOUNTER — Encounter: Payer: Self-pay | Admitting: Family

## 2016-06-17 ENCOUNTER — Other Ambulatory Visit: Payer: Self-pay | Admitting: *Deleted

## 2016-06-17 DIAGNOSIS — I739 Peripheral vascular disease, unspecified: Secondary | ICD-10-CM

## 2016-06-19 ENCOUNTER — Encounter: Payer: Self-pay | Admitting: Family

## 2016-06-19 ENCOUNTER — Ambulatory Visit (INDEPENDENT_AMBULATORY_CARE_PROVIDER_SITE_OTHER): Payer: Medicare Other | Admitting: Family

## 2016-06-19 ENCOUNTER — Ambulatory Visit (HOSPITAL_COMMUNITY)
Admission: RE | Admit: 2016-06-19 | Discharge: 2016-06-19 | Disposition: A | Discharge status: Home / Self Care | Payer: Medicare Other | Source: Ambulatory Visit | Attending: Family | Admitting: Family

## 2016-06-19 ENCOUNTER — Ambulatory Visit (INDEPENDENT_AMBULATORY_CARE_PROVIDER_SITE_OTHER)
Admission: RE | Admit: 2016-06-19 | Discharge: 2016-06-19 | Disposition: A | Discharge status: Home / Self Care | Payer: Medicare Other | Source: Ambulatory Visit | Attending: Family | Admitting: Family

## 2016-06-19 VITALS — BP 148/75 | HR 54 | Temp 97.6°F | Resp 16 | Ht 64.0 in | Wt 167.0 lb

## 2016-06-19 DIAGNOSIS — Z9862 Peripheral vascular angioplasty status: Secondary | ICD-10-CM | POA: Insufficient documentation

## 2016-06-19 DIAGNOSIS — I739 Peripheral vascular disease, unspecified: Secondary | ICD-10-CM | POA: Diagnosis not present

## 2016-06-19 DIAGNOSIS — I10 Essential (primary) hypertension: Secondary | ICD-10-CM | POA: Insufficient documentation

## 2016-06-19 DIAGNOSIS — Z96651 Presence of right artificial knee joint: Secondary | ICD-10-CM | POA: Diagnosis not present

## 2016-06-19 DIAGNOSIS — S85001D Unspecified injury of popliteal artery, right leg, subsequent encounter: Secondary | ICD-10-CM | POA: Diagnosis not present

## 2016-06-19 NOTE — Progress Notes (Signed)
[facility] [place]   CC: Follow up s/p repair of iatrogenically injured right popliteal artery   History of Present Illness [name] is a 83 y.o. female patient of Dr. Bridgett Larsson who is s/p bovine patch angioplasty of iatrogenically injured right popliteal artery (Date: 01/16/10). Since that procedure her knee has gotten infected and required debridement and further intervention in December 2014. She has less aching in right knee. The patient's treatment regimen currently included: maximal medical management, physical therapy which has concluded, and yearly surveillance of right lower extremity arterial system. She returns today for surveillance of her right popliteal artery.  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 She remains on oral antibiotics after receiving IV antibx.  She is back to her full activities including bowling.   She denies any history of stroke or TIA.  The patient denies New Medical or Surgical History.  Pt Diabetic: No Pt smoker: non-smoker  Pt meds include: Statin :Yes Antibiotic: Keflex, pt remains on this, states when antibx stopped in September 2015, had recurrence of right knee infection by December 2015 ASA: Yes Other anticoagulants/antiplatelets: [name] was stopped in 2015      Past Medical History:  Diagnosis Date  . Anxiety   . Arthritis    "joints" (03/16/2013)  . Breast cancer ([place])   . Bronchitis   . Chronic bronchitis ([place])    "yearly for the last 3-4 years" (03/16/2013)  . Complication of anesthesia   . Diverticulosis   . GERD (gastroesophageal reflux disease)   . Hiatal hernia   . High cholesterol   . Hypertension   . Hypothyroidism   . PONV (postoperative nausea and vomiting)   . S/P knee surgery    right knee surgery on 12-18  . Thyroid disease   . Uterine cancer [facility])      Social History Social History  Substance Use Topics  . Smoking status: Never Smoker  . Smokeless tobacco: Never Used  . Alcohol use No    Family History Family History  Problem Relation Age of Onset  . Diabetes Mother   . Heart disease Father   . Stroke Father   . Hypertension Father   . Heart attack Father   . Cancer Sister   . Diabetes Sister   . Hyperlipidemia Sister   . Hypertension Sister   . Cancer Brother   . Diabetes Brother   . Hypertension Brother   . Hyperlipidemia Daughter     Past Surgical History:  Procedure Laterality Date  . ABDOMINAL HYSTERECTOMY     "left an ovary" (03/16/2013)  . APPENDECTOMY    . BREAST BIOPSY Left   . EYE SURGERY    . GLAUCOMA SURGERY Bilateral 03/01/2013-03/08/2013  . I&D EXTREMITY Right 03/15/2013   Procedure: Exploration right total knee replacement , removal of components, and insertion of antibiotic spacer.;  Surgeon: Garald Balding, MD;  Location: [place];  Service: Orthopedics;  Laterality: Right;  . I&D KNEE WITH POLY EXCHANGE  03/08/2012   Procedure: IRRIGATION AND DEBRIDEMENT KNEE WITH POLY EXCHANGE;  Surgeon: Garald Balding, MD;  Location: [place];  Service: Orthopedics;  Laterality: Right;  Right Total Knee Irrigation & Debridement, Synovectomy, Poly Exchange, Placement of Antibiotic Beads  . JOINT REPLACEMENT Right Dec. 10, 2013   Knee  . JOINT REPLACEMENT Right Dec. 17, 2014   Knee I &  D  . MASTECTOMY Left ~ 2005  . PATCH ANGIOPLASTY Right 01/16/10   popliteal artery repair  . TOTAL KNEE  PROSTHESIS REMOVAL W/ SPACER INSERTION Right 03/15/2013  . TOTAL KNEE ARTHROPLASTY Left 2007  . TOTAL KNEE ARTHROPLASTY Right 01/17/2011    Allergies  Allergen Reactions  . Naproxen     bleeding stomach  . Nsaids     Bleeding stomach    Current Outpatient Prescriptions  Medication Sig Dispense Refill  . amLODipine (NORVASC) 5 MG tablet Take 5 mg by mouth daily.    Marland Kitchen aspirin 81 MG tablet Take 81 mg by mouth daily.     . cephALEXin (KEFLEX) 500 MG capsule Take 1 capsule (500 mg total) by mouth 2 (two) times daily. 120 capsule 6  . docusate sodium (COLACE) 100 MG capsule Take 100 mg by mouth 2 (two) times daily.    . ergocalciferol (VITAMIN D2) 50000 UNITS capsule Take 50,000 Units by mouth every 30 (thirty) days. Take on the 1st day of each month.    . indapamide (LOZOL) 1.25 MG tablet Take 1.25 mg by mouth every other day. Reported on 06/14/2015    . levothyroxine (SYNTHROID, LEVOTHROID) 50 MCG tablet Take 50 mcg by mouth daily.    Marland Kitchen LORazepam (ATIVAN) 0.5 MG tablet Take 0.5 mg by mouth every 8 (eight) hours as needed for anxiety. Reported on 06/14/2015    . Magnesium Hydroxide (MILK OF MAGNESIA PO) Take 30 mLs by mouth daily as needed (constipation).    . Oxycodone HCl 10 MG TABS Take 10 mg by mouth daily as needed (moderate to severe pain).     . pravastatin (PRAVACHOL) 40 MG tablet Take 40 mg by mouth daily.    . traMADol (ULTRAM) 50 MG tablet Take 50 mg by mouth every 6 (six) hours as needed for moderate pain.      No current facility-administered medications for this visit.     ROS: See HPI for pertinent positives and negatives.   Physical Examination  Vitals:   06/19/16 1525  BP: (!) 148/75  Pulse: (!) 54  Resp: 16  Temp: 97.6 F (36.4 C)  TempSrc: Oral  SpO2: 99%  Weight: 167 lb (75.8 kg)  Height: 5\' 4"  (1.626 m)   Body mass index is 28.67 kg/m.  General: A&O x 3, female, WDWN . Gait: normal Eyes: PERRLA. Pulmonary: Respirations are non labored, CTAB, without wheezes , rales, or rhonchi. Cardiac: regular rythm, no detected murmur.     Carotid Bruits Left Right   Negative Negative  Aorta is not palpable. Radial pulses: are 2+ palpable and =   VASCULAR EXAM: Extremities without ischemic changes  without Gangrene; without open wounds.      LE Pulses LEFT RIGHT   FEMORAL  palpable  palpable    POPLITEAL not palpable   palpable   POSTERIOR TIBIAL  palpable   palpable    DORSALIS PEDIS  ANTERIOR TIBIAL palpable  palpable    Abdomen: soft, NT, no palpable masses. Skin: no rashes, no ulcers. Musculoskeletal: no muscle wasting or atrophy.  Neurologic: A&O X 3; Appropriate Affect ; SENSATION: normal; MOTOR FUNCTION: moving all extremities equally, motor strength 5/5 throughout. Speech is fluent/normal. CN 2-12 intact.    ASSESSMENT: Pheobe Sandiford is a 83 y.o. female who is s/p bovine patch angioplasty of iatrogenically injured right popliteal artery (Date: 01/16/10). Since that procedure her knee has gotten infected and required debridement and further intervention in December 2014.  She is back to her  full activities including bowling.  She has no claudication symptoms, no signs of ischemia in her feet/legs.   DATA (06/19/16):  Right LE arterial Duplex suggests a diameter change (0.60 to 0.42 cm) (highest velocity at 162 cm/s, biphasic waveforms) in the popliteal artery with doubling of velocity suggesting >50% stenosis, with possible small area of plaque visualized.    ABI: Right: 1.20 (1.21, 06-14-15), waveforms: triphasic, TBI: 0.75 (0.65) Left: 1.27 (1.24, 06-14-15), waveforms: triphasic; TBI: 1.07 (0.94) Bilateral ABI and TBI remain normal.     PLAN:   Based on the patient's vascular studies and examination, pt will return to clinic in 1 year with ABI's and and right LE arterial Duplex.   I discussed in depth with the patient the nature of atherosclerosis, and emphasized the importance of maximal medical management including strict control of blood pressure, blood glucose, and lipid levels, obtaining regular exercise, and continued cessation of  smoking.  The patient is aware that without maximal medical management the underlying atherosclerotic disease process will progress, limiting the benefit of any interventions.  The patient was given information about PAD including signs, symptoms, treatment, what symptoms should prompt the patient to seek immediate medical care, and risk reduction measures to take.  Clemon Chambers, RN, MSN, FNP-C [facility] Phone: [phone]  Clinic MD: Bridgett Larsson  06/19/16 3:31 PM

## 2016-06-23 ENCOUNTER — Telehealth (INDEPENDENT_AMBULATORY_CARE_PROVIDER_SITE_OTHER): Payer: Self-pay | Admitting: Orthopaedic Surgery

## 2016-06-23 ENCOUNTER — Other Ambulatory Visit (INDEPENDENT_AMBULATORY_CARE_PROVIDER_SITE_OTHER): Payer: Self-pay

## 2016-06-23 DIAGNOSIS — B957 Other staphylococcus as the cause of diseases classified elsewhere: Secondary | ICD-10-CM

## 2016-06-23 NOTE — Telephone Encounter (Signed)
Patient is requesting refill of cephalexin to be sent to [facility] in [place].

## 2016-06-23 NOTE — Telephone Encounter (Signed)
Pt asked to make an appt with PW

## 2016-06-23 NOTE — Telephone Encounter (Signed)
Spoke with pt for over 20 min regarding this antibiotic. She said PW prescribed the meds but in EPIC only Dr Johnnye [place]a is listed as the provider.

## 2016-06-24 ENCOUNTER — Telehealth: Payer: Self-pay | Admitting: *Deleted

## 2016-06-24 ENCOUNTER — Other Ambulatory Visit: Payer: Self-pay | Admitting: *Deleted

## 2016-06-24 ENCOUNTER — Other Ambulatory Visit (INDEPENDENT_AMBULATORY_CARE_PROVIDER_SITE_OTHER): Payer: Self-pay

## 2016-06-24 DIAGNOSIS — B957 Other staphylococcus as the cause of diseases classified elsewhere: Secondary | ICD-10-CM

## 2016-06-24 MED ORDER — CEPHALEXIN 500 MG PO CAPS: 500.0000 mg | ORAL_CAPSULE | Freq: Two times a day (BID) | ORAL | 0 refills | Status: DC

## 2016-06-24 NOTE — Addendum Note (Signed)
Addended by: Lianne Cure A on: 06/24/2016 03:15 PM   Modules accepted: Orders

## 2016-06-24 NOTE — Telephone Encounter (Signed)
Patient called requesting a refill on keflex. Last seen 03/2014 and was suppose to follow up in one year. Scheduled her for 08/10/16, first available. She said her orthopedic has been refilling it, but no record of this. She is very worried about the infection returning. Please advise

## 2016-06-24 NOTE — Telephone Encounter (Signed)
She was supposed to see me a year ago. It is find to have her see [place] in May and continue the abx in the meantime

## 2016-06-24 NOTE — Telephone Encounter (Signed)
Rx sent and patient notified.

## 2016-06-25 NOTE — Telephone Encounter (Signed)
Perfect

## 2016-08-10 ENCOUNTER — Ambulatory Visit (INDEPENDENT_AMBULATORY_CARE_PROVIDER_SITE_OTHER): Payer: Medicare Other | Admitting: Infectious Disease

## 2016-08-10 ENCOUNTER — Encounter: Payer: Self-pay | Admitting: Infectious Disease

## 2016-08-10 VITALS — BP 133/80 | HR 60 | Temp 98.0°F | Ht 64.0 in | Wt 168.0 lb

## 2016-08-10 DIAGNOSIS — T8459XD Infection and inflammatory reaction due to other internal joint prosthesis, subsequent encounter: Secondary | ICD-10-CM

## 2016-08-10 DIAGNOSIS — B957 Other staphylococcus as the cause of diseases classified elsewhere: Secondary | ICD-10-CM

## 2016-08-10 DIAGNOSIS — Z96659 Presence of unspecified artificial knee joint: Secondary | ICD-10-CM | POA: Diagnosis not present

## 2016-08-10 MED ORDER — CEPHALEXIN 500 MG PO CAPS: 500.0000 mg | ORAL_CAPSULE | Freq: Two times a day (BID) | ORAL | 7 refills | Status: DC

## 2016-08-10 NOTE — Progress Notes (Signed)
Subjective:    Patient ID: [name], female    DOB: 05-Nov-1925, 83 y.o.   MRN: [id number]  HPI 83 yo F with hx of R TKR 10-09. Her surgery was complicated by popliteal artery injury requiring patch. Her course has also been complicated  She has had manipulation of her knee 04-2010, I & D/exchange of parts 03-09-2012. She had CoAg negative staph in her pre-operative Cx (R- pen). She was treated with ancef/rifampin then transitioned to keflex and rifampin. She again underwent manipulation of her knee 05-2012.  She was maintained on keflex/rifampin until 11-2012 when it was noted that her ESR and CRP were normal.  She then saw ortho  with swelling, pain and temp to 100. She had aspiration in office (Dr Durward Fortes) and was admitted on 03-15-13 and underwent removal of her prosthesis with placement of anbx spacer. Her Cx grew MSSE and she was d/c home on 12-24 on ancef. She was seen in ID f/u on 04/11/13 and had ESR 45 and CRP 0.6. She completed her IV anbx on 04-27-13 and then transitioned to po keflex. She was still having swelling and warmth in her knee. Has pain and swelling when she is on her leg too much (either sitting too long or walking a lot).    Still taking keflex. No problems with this.   Her CRP and ESR were repeated on 06-13-13 (ESR 14 and CRP <0.5).    Her surgeon was quite worried about risk of recurrent infection as was  the patient. She has remained on oral Keflex since we last saw her.   I last saw her 2 years ago and she has maintained on keflex since.   She is NOT wanting to trial off of it even thought he prosthetic material that was removable has been taken out  Still some pain and swelling at times. She does not want a new knee.  Past Medical History:  Diagnosis Date  . Anxiety   . Arthritis    "joints" (03/16/2013)  . Breast cancer ([place])   . Bronchitis   . Chronic bronchitis ([place])    "yearly for the last 3-4 years" (03/16/2013)  . Complication of anesthesia   .  Diverticulosis   . GERD (gastroesophageal reflux disease)   . Hiatal hernia   . High cholesterol   . Hypertension   . Hypothyroidism   . PONV (postoperative nausea and vomiting)   . S/P knee surgery    right knee surgery on 12-18  . Thyroid disease   . Uterine cancer [facility])     Past Surgical History:  Procedure Laterality Date  . ABDOMINAL HYSTERECTOMY     "left an ovary" (03/16/2013)  . APPENDECTOMY    . BREAST BIOPSY Left   . EYE SURGERY    . GLAUCOMA SURGERY Bilateral 03/01/2013-03/08/2013  . I&D EXTREMITY Right 03/15/2013   Procedure: Exploration right total knee replacement , removal of components, and insertion of antibiotic spacer.;  Surgeon: Garald Balding, MD;  Location: [place];  Service: Orthopedics;  Laterality: Right;  . I&D KNEE WITH POLY EXCHANGE  03/08/2012   Procedure: IRRIGATION AND DEBRIDEMENT KNEE WITH POLY EXCHANGE;  Surgeon: Garald Balding, MD;  Location: [place];  Service: Orthopedics;  Laterality: Right;  Right Total Knee Irrigation & Debridement, Synovectomy, Poly Exchange, Placement of Antibiotic Beads  . JOINT REPLACEMENT Right Dec. 10, 2013   Knee  . JOINT REPLACEMENT Right Dec. 17, 2014   Knee I & D  . MASTECTOMY  Left ~ 2005  . PATCH ANGIOPLASTY Right 01/16/10   popliteal artery repair  . TOTAL KNEE  PROSTHESIS REMOVAL W/ SPACER INSERTION Right 03/15/2013  . TOTAL KNEE ARTHROPLASTY Left 2007  . TOTAL KNEE ARTHROPLASTY Right 01/17/2011    Family History  Problem Relation Age of Onset  . Diabetes Mother   . Heart disease Father   . Stroke Father   . Hypertension Father   . Heart attack Father   . Cancer Sister   . Diabetes Sister   . Hyperlipidemia Sister   . Hypertension Sister   . Cancer Brother   . Diabetes Brother   . Hypertension Brother   . Hyperlipidemia Daughter       Social History   Social History  . Marital status: Divorced    Spouse name: N/A  . Number of children: N/A  . Years of education: N/A   Social History  Main Topics  . Smoking status: Never Smoker  . Smokeless tobacco: Never Used  . Alcohol use No  . Drug use: No  . Sexual activity: Not Currently   Other Topics Concern  . None   Social History Narrative  . None    Allergies  Allergen Reactions  . Naproxen     bleeding stomach  . Nsaids     Bleeding stomach     Current Outpatient Prescriptions:  .  amLODipine (NORVASC) 5 MG tablet, Take 5 mg by mouth daily., Disp: , Rfl:  .  aspirin 81 MG tablet, Take 81 mg by mouth daily., Disp: , Rfl:  .  cephALEXin (KEFLEX) 500 MG capsule, Take 1 capsule (500 mg total) by mouth 2 (two) times daily., Disp: 120 capsule, Rfl: 7 .  docusate sodium (COLACE) 100 MG capsule, Take 100 mg by mouth 2 (two) times daily., Disp: , Rfl:  .  indapamide (LOZOL) 1.25 MG tablet, Take 1.25 mg by mouth every other day. Reported on 06/14/2015, Disp: , Rfl:  .  levothyroxine (SYNTHROID, LEVOTHROID) 50 MCG tablet, Take 50 mcg by mouth daily., Disp: , Rfl:  .  Magnesium Hydroxide (MILK OF MAGNESIA PO), Take 30 mLs by mouth daily as needed (constipation)., Disp: , Rfl:  .  Oxycodone HCl 10 MG TABS, Take 10 mg by mouth daily as needed (moderate to severe pain). , Disp: , Rfl:  .  pravastatin (PRAVACHOL) 40 MG tablet, Take 40 mg by mouth daily., Disp: , Rfl:  .  traMADol (ULTRAM) 50 MG tablet, Take 50 mg by mouth every 6 (six) hours as needed for moderate pain. , Disp: , Rfl:  .  ergocalciferol (VITAMIN D2) 50000 UNITS capsule, Take 50,000 Units by mouth every 30 (thirty) days. Take on the 1st day of each month., Disp: , Rfl:  .  LORazepam (ATIVAN) 0.5 MG tablet, Take 0.5 mg by mouth every 8 (eight) hours as needed for anxiety. Reported on 06/14/2015, Disp: , Rfl:       Review of Systems  Constitutional: Negative for activity change, appetite change, chills, diaphoresis, fatigue, fever and unexpected weight change.  HENT: Negative for congestion, rhinorrhea, sinus pressure, sneezing, sore throat and trouble  swallowing.   Eyes: Negative for photophobia and visual disturbance.  Respiratory: Negative for cough, chest tightness, shortness of breath, wheezing and stridor.   Cardiovascular: Negative for chest pain, palpitations and leg swelling.  Gastrointestinal: Negative for abdominal distention, abdominal pain, anal bleeding, blood in stool, constipation, diarrhea, nausea and vomiting.  Genitourinary: Negative for difficulty urinating, dysuria, flank pain and hematuria.  Musculoskeletal: Positive for joint swelling. Negative for back pain, gait problem and myalgias.  Skin: Negative for color change, pallor, rash and wound.  Neurological: Negative for dizziness, tremors, weakness and light-headedness.  Hematological: Negative for adenopathy. Does not bruise/bleed easily.  Psychiatric/Behavioral: Negative for agitation, behavioral problems, confusion, decreased concentration, dysphoric mood and sleep disturbance.       Objective:   Physical Exam  Constitutional: She is oriented to person, place, and time. She appears well-developed and well-nourished. No distress.  HENT:  Head: Normocephalic and atraumatic.  Mouth/Throat: No oropharyngeal exudate.  Eyes: Conjunctivae and EOM are normal. No scleral icterus.  Neck: Normal range of motion. Neck supple.  Cardiovascular: Normal rate and regular rhythm.   Pulmonary/Chest: Effort normal. No respiratory distress. She has no wheezes.  Abdominal: She exhibits no distension.  Musculoskeletal: She exhibits no edema or tenderness.  Neurological: She is alert and oriented to person, place, and time. She exhibits normal muscle tone. Coordination normal.  Skin: Skin is warm and dry. No rash noted. She is not diaphoretic. No erythema. No pallor.  Psychiatric: She has a normal mood and affect. Her behavior is normal. Judgment and thought content normal.    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  Continue her on her Keflex . Recheck ESR, CRP bmp RTC  In 6 months

## 2016-08-11 LAB — BASIC METABOLIC PANEL WITH GFR
BUN: 14 mg/dL (ref 7–25)
CHLORIDE: 105 mmol/L (ref 98–110)
CO2: 21 mmol/L (ref 20–31)
Calcium: 8.8 mg/dL (ref 8.6–10.4)
Creat: 1.21 mg/dL — ABNORMAL HIGH (ref 0.60–0.88)
GFR, EST NON AFRICAN AMERICAN: 40 mL/min — AB (ref >=60)
GFR, Est African American: 46 mL/min — ABNORMAL LOW (ref >=60)
Glucose, Bld: 113 mg/dL — ABNORMAL HIGH (ref 65–99)
Potassium: 4.1 mmol/L (ref 3.5–5.3)
Sodium: 141 mmol/L (ref 135–146)

## 2016-08-11 LAB — SEDIMENTATION RATE: Sed Rate: 7 mm/hr (ref 0–30)

## 2016-08-11 LAB — C-REACTIVE PROTEIN: CRP: 2.4 mg/L (ref <8.0)

## 2017-02-10 ENCOUNTER — Encounter: Payer: Self-pay | Admitting: Infectious Disease

## 2017-02-10 ENCOUNTER — Ambulatory Visit (INDEPENDENT_AMBULATORY_CARE_PROVIDER_SITE_OTHER): Payer: Medicare Other | Admitting: Infectious Disease

## 2017-02-10 VITALS — BP 158/64 | HR 61 | Temp 98.2°F | Ht 64.0 in | Wt 168.0 lb

## 2017-02-10 DIAGNOSIS — Z96659 Presence of unspecified artificial knee joint: Secondary | ICD-10-CM | POA: Diagnosis not present

## 2017-02-10 DIAGNOSIS — T8459XD Infection and inflammatory reaction due to other internal joint prosthesis, subsequent encounter: Secondary | ICD-10-CM | POA: Diagnosis not present

## 2017-02-10 DIAGNOSIS — B957 Other staphylococcus as the cause of diseases classified elsewhere: Secondary | ICD-10-CM

## 2017-02-10 DIAGNOSIS — T8450XD Infection and inflammatory reaction due to unspecified internal joint prosthesis, subsequent encounter: Secondary | ICD-10-CM | POA: Diagnosis not present

## 2017-02-10 NOTE — Progress Notes (Signed)
Subjective:    Patient ID: [name], female    DOB: [birth year], 83 y.o.   MRN: [id number]  HPI   83 yo F with hx of R TKR 10-09. Her surgery was complicated by popliteal artery injury requiring patch. Her course has also been complicated  She has had manipulation of her knee 04-2010, I & D/exchange of parts 03-09-2012. She had CoAg negative staph in her pre-operative Cx (R- pen). She was treated with ancef/rifampin then transitioned to keflex and rifampin. She again underwent manipulation of her knee 05-2012.  She was maintained on keflex/rifampin until 11-2012 when it was noted that her ESR and CRP were normal.  She then saw ortho  with swelling, pain and temp to 100. She had aspiration in office (Dr Durward Fortes) and was admitted on 03-15-13 and underwent removal of her prosthesis with placement of anbx spacer. Her Cx grew MSSE and she was d/c home on 12-24 on ancef. She was seen in ID f/u on 04/11/13 and had ESR 45 and CRP 0.6. She completed her IV anbx on 04-27-13 and then transitioned to po keflex. She was still having swelling and warmth in her knee. Has pain and swelling when she is on her leg too much (either sitting too long or walking a lot).    Still taking keflex. No problems with this.   Her CRP and ESR were repeated on 06-13-13 (ESR 14 and CRP <0.5).    Her surgeon was quite worried about risk of recurrent infection as was  the patient. She has remained on oral Keflex since we last saw her.   I last saw her several years ago and she has maintained on keflex since.   She had  NOT been wanting to trial off of it even thought he prosthetic material that was removable has been taken out  I saw her again in May and again her inflammatory markers were normal.  She still want to stay on antibiotics.  I saw her again today and she is doing well with minimal knee pain.  She still wanted to stay on the Keflex but I explained that with the prosthesis having been removed I felt it is worth  having a trial off of the anti-biotics and she agreed to do this if her inflammatory markers were normal.  Past Medical History:  Diagnosis Date  . Anxiety   . Arthritis    "joints" (03/16/2013)  . Breast cancer ([place])   . Bronchitis   . Chronic bronchitis ([place])    "yearly for the last 3-4 years" (03/16/2013)  . Complication of anesthesia   . Diverticulosis   . GERD (gastroesophageal reflux disease)   . Hiatal hernia   . High cholesterol   . Hypertension   . Hypothyroidism   . PONV (postoperative nausea and vomiting)   . S/P knee surgery    right knee surgery on 12-18  . Thyroid disease   . Uterine cancer [facility])     Past Surgical History:  Procedure Laterality Date  . ABDOMINAL HYSTERECTOMY     "left an ovary" (03/16/2013)  . APPENDECTOMY    . BREAST BIOPSY Left   . EYE SURGERY    . GLAUCOMA SURGERY Bilateral 03/01/2013-03/08/2013  . JOINT REPLACEMENT Right Dec. 10, 2013   Knee  . JOINT REPLACEMENT Right Dec. 17, 2014   Knee I & D  . MASTECTOMY Left ~ 2005  . PATCH ANGIOPLASTY Right 01/16/10   popliteal artery repair  . TOTAL KNEE  PROSTHESIS REMOVAL W/ SPACER INSERTION Right 03/15/2013  . TOTAL KNEE ARTHROPLASTY Left 2007  . TOTAL KNEE ARTHROPLASTY Right 01/17/2011    Family History  Problem Relation Age of Onset  . Diabetes Mother   . Heart disease Father   . Stroke Father   . Hypertension Father   . Heart attack Father   . Cancer Sister   . Diabetes Sister   . Hyperlipidemia Sister   . Hypertension Sister   . Cancer Brother   . Diabetes Brother   . Hypertension Brother   . Hyperlipidemia Daughter       Social History   Socioeconomic History  . Marital status: Divorced    Spouse name: None  . Number of children: None  . Years of education: None  . Highest education level: None  Social Needs  . Financial resource strain: None  . Food insecurity - worry: None  . Food insecurity - inability: None  . Transportation needs - medical: None  .  Transportation needs - non-medical: None  Occupational History  . None  Tobacco Use  . Smoking status: Never Smoker  . Smokeless tobacco: Never Used  Substance and Sexual Activity  . Alcohol use: No    Alcohol/week: 0.0 oz  . Drug use: No  . Sexual activity: Not Currently  Other Topics Concern  . None  Social History Narrative  . None    Allergies  Allergen Reactions  . Naproxen     bleeding stomach  . Nsaids     Bleeding stomach     Current Outpatient Medications:  .  amLODipine (NORVASC) 5 MG tablet, Take 5 mg by mouth daily., Disp: , Rfl:  .  aspirin 81 MG tablet, Take 81 mg by mouth daily., Disp: , Rfl:  .  cephALEXin (KEFLEX) 500 MG capsule, Take 1 capsule (500 mg total) by mouth 2 (two) times daily., Disp: 120 capsule, Rfl: 7 .  docusate sodium (COLACE) 100 MG capsule, Take 100 mg by mouth 2 (two) times daily., Disp: , Rfl:  .  ergocalciferol (VITAMIN D2) 50000 UNITS capsule, Take 50,000 Units by mouth every 30 (thirty) days. Take on the 1st day of each month., Disp: , Rfl:  .  indapamide (LOZOL) 1.25 MG tablet, Take 1.25 mg by mouth every other day. Reported on 06/14/2015, Disp: , Rfl:  .  levothyroxine (SYNTHROID, LEVOTHROID) 50 MCG tablet, Take 50 mcg by mouth daily., Disp: , Rfl:  .  Magnesium Hydroxide (MILK OF MAGNESIA PO), Take 30 mLs by mouth daily as needed (constipation)., Disp: , Rfl:  .  Oxycodone HCl 10 MG TABS, Take 10 mg by mouth daily as needed (moderate to severe pain). , Disp: , Rfl:  .  pravastatin (PRAVACHOL) 40 MG tablet, Take 40 mg by mouth daily., Disp: , Rfl:  .  traMADol (ULTRAM) 50 MG tablet, Take 50 mg by mouth every 6 (six) hours as needed for moderate pain. , Disp: , Rfl:  .  LORazepam (ATIVAN) 0.5 MG tablet, Take 0.5 mg by mouth every 8 (eight) hours as needed for anxiety. Reported on 06/14/2015, Disp: , Rfl:       Review of Systems  Constitutional: Negative for activity change, appetite change, chills, diaphoresis, fatigue, fever and  unexpected weight change.  HENT: Negative for congestion, rhinorrhea, sinus pressure, sneezing, sore throat and trouble swallowing.   Eyes: Negative for photophobia and visual disturbance.  Respiratory: Negative for cough, chest tightness, shortness of breath, wheezing and stridor.   Cardiovascular: Negative for  chest pain, palpitations and leg swelling.  Gastrointestinal: Negative for abdominal distention, abdominal pain, anal bleeding, blood in stool, constipation, diarrhea, nausea and vomiting.  Genitourinary: Negative for difficulty urinating, dysuria, flank pain and hematuria.  Musculoskeletal: Positive for joint swelling. Negative for back pain, gait problem and myalgias.  Skin: Negative for color change, pallor, rash and wound.  Neurological: Negative for dizziness, tremors, weakness and light-headedness.  Hematological: Negative for adenopathy. Does not bruise/bleed easily.  Psychiatric/Behavioral: Negative for agitation, behavioral problems, confusion, decreased concentration, dysphoric mood and sleep disturbance.       Objective:   Physical Exam  Constitutional: She is oriented to person, place, and time. She appears well-developed and well-nourished. No distress.  HENT:  Head: Normocephalic and atraumatic.  Mouth/Throat: No oropharyngeal exudate.  Eyes: Conjunctivae and EOM are normal. No scleral icterus.  Neck: Normal range of motion. Neck supple.  Cardiovascular: Normal rate and regular rhythm.  Pulmonary/Chest: Effort normal. No respiratory distress. She has no wheezes.  Abdominal: She exhibits no distension.  Musculoskeletal: She exhibits no edema or tenderness.  Neurological: She is alert and oriented to person, place, and time. She exhibits normal muscle tone. Coordination normal.  Skin: Skin is warm and dry. No rash noted. She is not diaphoretic. No erythema. No pallor.  Psychiatric: She has a normal mood and affect. Her behavior is normal. Judgment and thought content  normal.    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   Recheck ESR, CRP bmp trial off keflex and RTC  In 3 months

## 2017-02-11 LAB — CBC WITH DIFFERENTIAL/PLATELET
BASOS PCT: 0.6 %
Basophils Absolute: 40 cells/uL (ref 0–200)
EOS ABS: 99 {cells}/uL (ref 15–500)
Eosinophils Relative: 1.5 %
HCT: 44.2 % (ref 35.0–45.0)
Hemoglobin: 15 g/dL (ref 11.7–15.5)
Lymphs Abs: 1478 cells/uL (ref 850–3900)
MCH: 30.1 pg (ref 27.0–33.0)
MCHC: 33.9 g/dL (ref 32.0–36.0)
MCV: 88.8 fL (ref 80.0–100.0)
MONOS PCT: 6 %
MPV: 11.4 fL (ref 7.5–12.5)
Neutro Abs: 4587 cells/uL (ref 1500–7800)
Neutrophils Relative %: 69.5 %
PLATELETS: 221 10*3/uL (ref 140–400)
RBC: 4.98 10*6/uL (ref 3.80–5.10)
RDW: 13.2 % (ref 11.0–15.0)
TOTAL LYMPHOCYTE: 22.4 %
WBC mixed population: 396 cells/uL (ref 200–950)
WBC: 6.6 10*3/uL (ref 3.8–10.8)

## 2017-02-11 LAB — SEDIMENTATION RATE: Sed Rate: 19 mm/h (ref 0–30)

## 2017-02-11 LAB — BASIC METABOLIC PANEL WITH GFR
BUN/Creatinine Ratio: 14 (calc) (ref 6–22)
BUN: 16 mg/dL (ref 7–25)
CALCIUM: 8.9 mg/dL (ref 8.6–10.4)
CO2: 27 mmol/L (ref 20–32)
Chloride: 104 mmol/L (ref 98–110)
Creat: 1.14 mg/dL — ABNORMAL HIGH (ref 0.60–0.88)
GFR, EST NON AFRICAN AMERICAN: 43 mL/min/{1.73_m2} — AB (ref >=60)
GFR, Est African American: 49 mL/min/{1.73_m2} — ABNORMAL LOW (ref >=60)
Glucose, Bld: 101 mg/dL — ABNORMAL HIGH (ref 65–99)
POTASSIUM: 4.4 mmol/L (ref 3.5–5.3)
Sodium: 140 mmol/L (ref 135–146)

## 2017-02-11 LAB — C-REACTIVE PROTEIN: CRP: 1.6 mg/L (ref <8.0)

## 2017-05-19 ENCOUNTER — Ambulatory Visit: Payer: Medicare Other | Admitting: Infectious Disease

## 2017-06-15 ENCOUNTER — Ambulatory Visit: Payer: Medicare Other | Admitting: Infectious Disease

## 2017-06-21 ENCOUNTER — Ambulatory Visit: Payer: Medicare Other | Admitting: Infectious Disease

## 2017-06-21 ENCOUNTER — Encounter: Payer: Self-pay | Admitting: Infectious Disease

## 2017-06-21 VITALS — BP 143/75 | HR 65 | Temp 98.2°F | Ht 64.0 in | Wt 164.0 lb

## 2017-06-21 DIAGNOSIS — T8450XD Infection and inflammatory reaction due to unspecified internal joint prosthesis, subsequent encounter: Secondary | ICD-10-CM | POA: Diagnosis not present

## 2017-06-21 DIAGNOSIS — B957 Other staphylococcus as the cause of diseases classified elsewhere: Secondary | ICD-10-CM | POA: Diagnosis not present

## 2017-06-21 NOTE — Progress Notes (Signed)
Subjective:    Patient ID: Lenore Cordia, female    DOB: [birth year], 84 y.o.   MRN: [id number]  HPI   84 yo F with hx of R TKR 10-09. Her surgery was complicated by popliteal artery injury requiring patch. Her course has also been complicated  She has had manipulation of her knee 04-2010, I & D/exchange of parts 03-09-2012. She had CoAg negative staph in her pre-operative Cx (R- pen). She was treated with ancef/rifampin then transitioned to keflex and rifampin. She again underwent manipulation of her knee 05-2012.  She was maintained on keflex/rifampin until 11-2012 when it was noted that her ESR and CRP were normal.  She then saw ortho  with swelling, pain and temp to 100. She had aspiration in office (Dr Durward Fortes) and was admitted on 03-15-13 and underwent removal of her prosthesis with placement of anbx spacer. Her Cx grew MSSE and she was d/c home on 12-24 on ancef. She was seen in ID f/u on 04/11/13 and had ESR 45 and CRP 0.6. She completed her IV anbx on 04-27-13 and then transitioned to po keflex. She was still having swelling and warmth in her knee. Has pain and swelling when she was on her leg too much (either sitting too long or walking a lot).     Her CRP and ESR were repeated on 06-13-13 (ESR 14 and CRP <0.5).    Her surgeon was quite worried about risk of recurrent infection as was  the patient. She has remained on oral Keflex since we last saw her.   I last saw her several years ago and she has maintained on keflex since.   She had  NOT been wanting to trial off of it even thought he prosthetic material that was removable has been taken out  I saw her again in May 2018 and again her inflammatory markers were normal.  She still wanted to stay on antibiotics.  I saw her again in November and she is doing well with minimal knee pain.  She still wanted to stay on the Keflex but I explained that with the prosthesis having been removed I felt it is worth having a trial off of the  anti-biotics and she agreed to do this if her inflammatory markers were normal.  He had a similar conversation yet again today.  She has never flared coming off oral antibiotics after removal of her prosthesis and placement of antibiotic spacer.  I do worry about the risk of Clostridium difficile colitis.  I will correspond with her surgeon with regards to his concerns so they can make sure on the same page.  For now I will continue antibiotics but we will recheck her inflammatory markers  Past Medical History:  Diagnosis Date  . Anxiety   . Arthritis    "joints" (03/16/2013)  . Breast cancer ([place])   . Bronchitis   . Chronic bronchitis ([place])    "yearly for the last 3-4 years" (03/16/2013)  . Complication of anesthesia   . Diverticulosis   . GERD (gastroesophageal reflux disease)   . Hiatal hernia   . High cholesterol   . Hypertension   . Hypothyroidism   . PONV (postoperative nausea and vomiting)   . S/P knee surgery    right knee surgery on 12-18  . Thyroid disease   . Uterine cancer [facility])     Past Surgical History:  Procedure Laterality Date  . ABDOMINAL HYSTERECTOMY     "left an ovary" (03/16/2013)  .  APPENDECTOMY    . BREAST BIOPSY Left   . EYE SURGERY    . GLAUCOMA SURGERY Bilateral 03/01/2013-03/08/2013  . I&D EXTREMITY Right 03/15/2013   Procedure: Exploration right total knee replacement , removal of components, and insertion of antibiotic spacer.;  Surgeon: Garald Balding, MD;  Location: [place];  Service: Orthopedics;  Laterality: Right;  . I&D KNEE WITH POLY EXCHANGE  03/08/2012   Procedure: IRRIGATION AND DEBRIDEMENT KNEE WITH POLY EXCHANGE;  Surgeon: Garald Balding, MD;  Location: [facility];  Service: Orthopedics;  Laterality: Right;  Right Total Knee Irrigation & Debridement, Synovectomy, Poly Exchange, Placement of Antibiotic Beads  . JOINT REPLACEMENT Right Dec. 10, 2013   Knee  . JOINT REPLACEMENT Right Dec. 17, 2014   Knee I & D  . MASTECTOMY Left ~ 2005    . PATCH ANGIOPLASTY Right 01/16/10   popliteal artery repair  . TOTAL KNEE  PROSTHESIS REMOVAL W/ SPACER INSERTION Right 03/15/2013  . TOTAL KNEE ARTHROPLASTY Left 2007  . TOTAL KNEE ARTHROPLASTY Right 01/17/2011    Family History  Problem Relation Age of Onset  . Diabetes Mother   . Heart disease Father   . Stroke Father   . Hypertension Father   . Heart attack Father   . Cancer Sister   . Diabetes Sister   . Hyperlipidemia Sister   . Hypertension Sister   . Cancer Brother   . Diabetes Brother   . Hypertension Brother   . Hyperlipidemia Daughter       Social History   Socioeconomic History  . Marital status: Divorced    Spouse name: Not on file  . Number of children: Not on file  . Years of education: Not on file  . Highest education level: Not on file  Occupational History  . Not on file  Social Needs  . Financial resource strain: Not on file  . Food insecurity:    Worry: Not on file    Inability: Not on file  . Transportation needs:    Medical: Not on file    Non-medical: Not on file  Tobacco Use  . Smoking status: Never Smoker  . Smokeless tobacco: Never Used  Substance and Sexual Activity  . Alcohol use: No    Alcohol/week: 0.0 oz  . Drug use: No  . Sexual activity: Not Currently  Lifestyle  . Physical activity:    Days per week: Not on file    Minutes per session: Not on file  . Stress: Not on file  Relationships  . Social connections:    Talks on phone: Not on file    Gets together: Not on file    Attends religious service: Not on file    Active member of club or organization: Not on file    Attends meetings of clubs or organizations: Not on file    Relationship status: Not on file  Other Topics Concern  . Not on file  Social History Narrative  . Not on file    Allergies  Allergen Reactions  . Naproxen     bleeding stomach  . Nsaids     Bleeding stomach     Current Outpatient Medications:  .  amLODipine (NORVASC) 5 MG tablet,  Take 5 mg by mouth daily., Disp: , Rfl:  .  aspirin 81 MG tablet, Take 81 mg by mouth daily., Disp: , Rfl:  .  cephALEXin (KEFLEX) 500 MG capsule, Take 1 capsule (500 mg total) by mouth 2 (two) times daily.,  Disp: 120 capsule, Rfl: 7 .  docusate sodium (COLACE) 100 MG capsule, Take 100 mg by mouth 2 (two) times daily., Disp: , Rfl:  .  ergocalciferol (VITAMIN D2) 50000 UNITS capsule, Take 50,000 Units by mouth every 30 (thirty) days. Take on the 1st day of each month., Disp: , Rfl:  .  indapamide (LOZOL) 1.25 MG tablet, Take 1.25 mg by mouth every other day. Reported on 06/14/2015, Disp: , Rfl:  .  levothyroxine (SYNTHROID, LEVOTHROID) 50 MCG tablet, Take 50 mcg by mouth daily., Disp: , Rfl:  .  LORazepam (ATIVAN) 0.5 MG tablet, Take 0.5 mg by mouth every 8 (eight) hours as needed for anxiety. Reported on 06/14/2015, Disp: , Rfl:  .  Magnesium Hydroxide (MILK OF MAGNESIA PO), Take 30 mLs by mouth daily as needed (constipation)., Disp: , Rfl:  .  Oxycodone HCl 10 MG TABS, Take 10 mg by mouth daily as needed (moderate to severe pain). , Disp: , Rfl:  .  pravastatin (PRAVACHOL) 40 MG tablet, Take 40 mg by mouth daily., Disp: , Rfl:  .  traMADol (ULTRAM) 50 MG tablet, Take 50 mg by mouth every 6 (six) hours as needed for moderate pain. , Disp: , Rfl:       Review of Systems  Constitutional: Negative for activity change, appetite change, chills, diaphoresis, fatigue, fever and unexpected weight change.  HENT: Negative for congestion, rhinorrhea, sinus pressure, sneezing, sore throat and trouble swallowing.   Eyes: Negative for photophobia and visual disturbance.  Respiratory: Negative for cough, chest tightness, shortness of breath, wheezing and stridor.   Cardiovascular: Negative for chest pain, palpitations and leg swelling.  Gastrointestinal: Negative for abdominal distention, abdominal pain, anal bleeding, blood in stool, constipation, diarrhea, nausea and vomiting.  Genitourinary: Negative for  difficulty urinating, dysuria, flank pain and hematuria.  Musculoskeletal: Positive for joint swelling. Negative for back pain, gait problem and myalgias.  Skin: Negative for color change, pallor, rash and wound.  Neurological: Negative for dizziness, tremors, weakness and light-headedness.  Hematological: Negative for adenopathy. Does not bruise/bleed easily.  Psychiatric/Behavioral: Negative for agitation, behavioral problems, confusion, decreased concentration, dysphoric mood and sleep disturbance.       Objective:   Physical Exam  Constitutional: She is oriented to person, place, and time. She appears well-developed and well-nourished. No distress.  HENT:  Head: Normocephalic and atraumatic.  Mouth/Throat: No oropharyngeal exudate.  Eyes: Conjunctivae and EOM are normal. No scleral icterus.  Neck: Normal range of motion. Neck supple.  Cardiovascular: Normal rate and regular rhythm.  Pulmonary/Chest: Effort normal. No respiratory distress. She has no wheezes.  Abdominal: She exhibits no distension.  Musculoskeletal: She exhibits no edema or tenderness.  Neurological: She is alert and oriented to person, place, and time. She exhibits normal muscle tone. Coordination normal.  Skin: Skin is warm and dry. No rash noted. She is not diaphoretic. No erythema. No pallor.  Psychiatric: She has a normal mood and affect. Her behavior is normal. Judgment and thought content normal.  Nursing note and vitals reviewed.   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   Recheck ESR, CRP bmp   Discuss with her surgeon to gain clarity re anxiety of stopping oral abx. She is nearly 5 years since removal of her prosthesis and  I feel she should trial off of  them  I spent greater than 25 minutes with the patient including greater than 50% of time in face to face counsel of the patient risks of ongoing antibiotics versus questionable benefit of continuing them in a knee in which we should have achieved cure of her prosthetic joint infection and in coordination of her care.

## 2017-06-22 LAB — CBC WITH DIFFERENTIAL/PLATELET
BASOS ABS: 20 {cells}/uL (ref 0–200)
Basophils Relative: 0.4 %
Eosinophils Absolute: 140 cells/uL (ref 15–500)
Eosinophils Relative: 2.8 %
HCT: 41.8 % (ref 35.0–45.0)
HEMOGLOBIN: 14.4 g/dL (ref 11.7–15.5)
Lymphs Abs: 1550 cells/uL (ref 850–3900)
MCH: 29.7 pg (ref 27.0–33.0)
MCHC: 34.4 g/dL (ref 32.0–36.0)
MCV: 86.2 fL (ref 80.0–100.0)
MPV: 11.4 fL (ref 7.5–12.5)
Monocytes Relative: 6.4 %
NEUTROS ABS: 2970 {cells}/uL (ref 1500–7800)
NEUTROS PCT: 59.4 %
Platelets: 213 10*3/uL (ref 140–400)
RBC: 4.85 10*6/uL (ref 3.80–5.10)
RDW: 13.1 % (ref 11.0–15.0)
Total Lymphocyte: 31 %
WBC: 5 10*3/uL (ref 3.8–10.8)
WBCMIX: 320 {cells}/uL (ref 200–950)

## 2017-06-22 LAB — BASIC METABOLIC PANEL WITH GFR
BUN / CREAT RATIO: 18 (calc) (ref 6–22)
BUN: 20 mg/dL (ref 7–25)
CALCIUM: 9.3 mg/dL (ref 8.6–10.4)
CO2: 25 mmol/L (ref 20–32)
CREATININE: 1.09 mg/dL — AB (ref 0.60–0.88)
Chloride: 106 mmol/L (ref 98–110)
GFR, EST AFRICAN AMERICAN: 52 mL/min/{1.73_m2} — AB (ref >=60)
GFR, Est Non African American: 45 mL/min/{1.73_m2} — ABNORMAL LOW (ref >=60)
Glucose, Bld: 146 mg/dL — ABNORMAL HIGH (ref 65–99)
Potassium: 4.3 mmol/L (ref 3.5–5.3)
Sodium: 138 mmol/L (ref 135–146)

## 2017-06-22 LAB — C-REACTIVE PROTEIN: CRP: 3.3 mg/L (ref <8.0)

## 2017-06-22 LAB — SEDIMENTATION RATE: Sed Rate: 19 mm/h (ref 0–30)

## 2017-06-25 ENCOUNTER — Other Ambulatory Visit: Payer: Self-pay

## 2017-06-25 ENCOUNTER — Encounter: Payer: Self-pay | Admitting: Family

## 2017-06-25 ENCOUNTER — Ambulatory Visit (HOSPITAL_COMMUNITY)
Admission: RE | Admit: 2017-06-25 | Discharge: 2017-06-25 | Disposition: A | Discharge status: Home / Self Care | Payer: Medicare Other | Source: Ambulatory Visit | Attending: Vascular Surgery | Admitting: Vascular Surgery

## 2017-06-25 ENCOUNTER — Ambulatory Visit (INDEPENDENT_AMBULATORY_CARE_PROVIDER_SITE_OTHER): Payer: Medicare Other | Admitting: Family

## 2017-06-25 ENCOUNTER — Ambulatory Visit (INDEPENDENT_AMBULATORY_CARE_PROVIDER_SITE_OTHER)
Admission: RE | Admit: 2017-06-25 | Discharge: 2017-06-25 | Disposition: A | Discharge status: Home / Self Care | Payer: Medicare Other | Source: Ambulatory Visit | Attending: Vascular Surgery | Admitting: Vascular Surgery

## 2017-06-25 VITALS — BP 137/76 | HR 62 | Temp 97.4°F | Resp 16 | Ht 64.0 in | Wt 164.0 lb

## 2017-06-25 DIAGNOSIS — X58XXXD Exposure to other specified factors, subsequent encounter: Secondary | ICD-10-CM | POA: Diagnosis not present

## 2017-06-25 DIAGNOSIS — S85001D Unspecified injury of popliteal artery, right leg, subsequent encounter: Secondary | ICD-10-CM | POA: Diagnosis present

## 2017-06-25 DIAGNOSIS — I70201 Unspecified atherosclerosis of native arteries of extremities, right leg: Secondary | ICD-10-CM | POA: Insufficient documentation

## 2017-06-25 NOTE — Progress Notes (Signed)
[facility] [place]   CC: Follow up peripheral artery occlusive disease  History of Present Illness [name] is a 84 y.o. female who is s/p bovine patch angioplasty of iatrogenically injured right popliteal artery (Date: 01/16/10) by Dr. Bridgett Larsson. Since that procedure her knee became infected and required debridement and further intervention in December 2014.  She had emergent right knee surgery on 03/15/2013 at [facility] for reinfection of the right total knee replacement, artificial joint was removed and she has a spacer in place. She remains on oral antibiotics after receiving IV antibx.  She is back to her full activities including bowling.   She has no claudication sx's in her legs with walking. She denies any known hx of stroke or TIA.   Pt Diabetic: No Pt smoker: non-smoker  Pt meds include: Statin :Yes Antibiotic: Keflex, pt remains on this, states when antibx stopped in September 2015, had recurrence of right knee infection by December 2015 ASA: Yes Other anticoagulants/antiplatelets: [name] was stopped in 2015    Past Medical History:  Diagnosis Date  . Anxiety   . Arthritis    "joints" (03/16/2013)  . Breast cancer ([place])   . Bronchitis   . Chronic bronchitis ([place])    "yearly for the last 3-4 years" (03/16/2013)  . Complication of anesthesia   . Diverticulosis   . GERD (gastroesophageal reflux disease)   . Hiatal hernia   . High cholesterol   . Hypertension   . Hypothyroidism   . PONV (postoperative nausea and vomiting)   . S/P knee surgery    right knee surgery on 12-18  . Thyroid disease   . Uterine cancer [facility])     Social History Social History   Tobacco Use  . Smoking status: Never Smoker  . Smokeless tobacco: Never Used  Substance Use Topics  . Alcohol use: No    Alcohol/week: 0.0 oz  . Drug use: No    Family History Family History  Problem Relation Age of Onset  . Diabetes Mother   . Heart disease Father   . Stroke  Father   . Hypertension Father   . Heart attack Father   . Cancer Sister   . Diabetes Sister   . Hyperlipidemia Sister   . Hypertension Sister   . Cancer Brother   . Diabetes Brother   . Hypertension Brother   . Hyperlipidemia Daughter     Past Surgical History:  Procedure Laterality Date  . ABDOMINAL HYSTERECTOMY     "left an ovary" (03/16/2013)  . APPENDECTOMY    . BREAST BIOPSY Left   . EYE SURGERY    . GLAUCOMA SURGERY Bilateral 03/01/2013-03/08/2013  . I&D EXTREMITY Right 03/15/2013   Procedure: Exploration right total knee replacement , removal of components, and insertion of antibiotic spacer.;  Surgeon: Garald Balding, MD;  Location: [place];  Service: Orthopedics;  Laterality: Right;  . I&D KNEE WITH POLY EXCHANGE  03/08/2012   Procedure: IRRIGATION AND DEBRIDEMENT KNEE WITH POLY EXCHANGE;  Surgeon: Garald Balding, MD;  Location: [place];  Service: Orthopedics;  Laterality: Right;  Right Total Knee Irrigation & Debridement, Synovectomy, Poly Exchange, Placement of Antibiotic Beads  . JOINT REPLACEMENT Right Dec. 10, 2013   Knee  . JOINT REPLACEMENT Right Dec. 17, 2014   Knee I & D  . MASTECTOMY Left ~ 2005  . PATCH ANGIOPLASTY Right 01/16/10   popliteal artery repair  . TOTAL KNEE  PROSTHESIS REMOVAL W/ SPACER INSERTION Right 03/15/2013  .  TOTAL KNEE ARTHROPLASTY Left 2007  . TOTAL KNEE ARTHROPLASTY Right 01/17/2011    Allergies  Allergen Reactions  . Naproxen     bleeding stomach  . Nsaids     Bleeding stomach  . Hydrocodone-Acetaminophen Other (See Comments)    Stomach bleeding    Current Outpatient Medications  Medication Sig Dispense Refill  . amLODipine (NORVASC) 5 MG tablet Take 5 mg by mouth daily.    Marland Kitchen aspirin 81 MG tablet Take 81 mg by mouth daily.    . cephALEXin (KEFLEX) 500 MG capsule Take 1 capsule (500 mg total) by mouth 2 (two) times daily. 120 capsule 7  . docusate sodium (COLACE) 100 MG capsule Take 100 mg by mouth 2 (two) times daily.     . ergocalciferol (VITAMIN D2) 50000 UNITS capsule Take 50,000 Units by mouth every 30 (thirty) days. Take on the 1st day of each month.    . indapamide (LOZOL) 1.25 MG tablet Take 1.25 mg by mouth every other day. Reported on 06/14/2015    . levothyroxine (SYNTHROID, LEVOTHROID) 50 MCG tablet Take 50 mcg by mouth daily.    . Magnesium Hydroxide (MILK OF MAGNESIA PO) Take 30 mLs by mouth daily as needed (constipation).    . Oxycodone HCl 10 MG TABS Take 10 mg by mouth daily as needed (moderate to severe pain).     . pravastatin (PRAVACHOL) 40 MG tablet Take 40 mg by mouth daily.    . traMADol (ULTRAM) 50 MG tablet Take 50 mg by mouth every 6 (six) hours as needed for moderate pain.     Marland Kitchen LORazepam (ATIVAN) 0.5 MG tablet Take 0.5 mg by mouth every 8 (eight) hours as needed for anxiety. Reported on 06/14/2015     No current facility-administered medications for this visit.     ROS: See HPI for pertinent positives and negatives.   Physical Examination  Vitals:   06/25/17 1441  BP: 137/76  Pulse: 62  Resp: 16  Temp: (!) 97.4 F (36.3 C)  TempSrc: Oral  SpO2: 96%  Weight: 164 lb (74.4 kg)  Height: 5\' 4"  (1.626 m)   Body mass index is 28.15 kg/m.  General: A&O x 3, WDWN female, appears younger than her stated age . Gait: normal HENT: No gross abnormalities  Eyes: PERRLA. Pulmonary: Respirations are non labored, CTAB, without wheezes, rales, or rhonchi. Cardiac: regular rythm, no detected murmur.     Carotid Bruits Left Right   Negative Negative   Abdominal aortic pulse is not palpable. Radial pulses: are 2+ palpable and =   VASCULAR EXAM: Extremitieswithout ischemic changes  without Gangrene; without open wounds.     LE Pulses LEFT RIGHT   FEMORAL  palpable   palpable    POPLITEAL 1+ palpable  1+ palpable   POSTERIOR TIBIAL  palpable   palpable    DORSALIS PEDIS  ANTERIOR TIBIAL palpable  palpable    Abdomen: soft, NT, no palpable masses. Skin: no rashes, no cellulitis, no ulcers noted. Musculoskeletal: no muscle wasting or atrophy.  Neurologic: A&O X 3; appropriate affect, Sensation is normal; MOTOR FUNCTION:  moving all extremities equally, motor strength 5/5 throughout. Speech is fluent/normal. CN 2-12 intact. Psychiatric: Thought content is normal, mood appropriate for clinical situation.     ASSESSMENT: [name] is a 84 y.o. female who is s/p bovine patch angioplasty of iatrogenically injured right popliteal artery (Date: 01/16/10). Since that procedure her knee became infected and required debridement and further intervention in December 2014.  She  is back to her full activities including bowling.  She has no claudication symptoms with walking, no signs of ischemia in her feet/legs.   DATA  Right LE Arterial Duplex (06/25/17): A focal velocity elevation of 141 cm/s at distal popliteal artery (30-49% stenosis). All biphasic waveforms. No significant change compared to the exam on 06-19-16.   ABI (Date: 06/25/2017):  R:   ABI: 0.90 (was 1.20 on 06-19-16),   PT: tri  DP: bi  TBI:  0.71 (was 0.75)  L:   ABI: 0.94 (was 1.27)),   PT: tri  DP: tri  TBI: 0.77 (was 1.07)  Mild decline in bilateral ABI and TBI; mild disease in both lower extremities, tri and biphasic waveforms.    PLAN:   Based on the patient's vascular studies and examination, pt will return to clinic in 1 year with ABI's and and right LE arterial Duplex.  I discussed in depth with the patient the nature of atherosclerosis, and emphasized the importance of maximal medical management including strict control of blood pressure, blood glucose, and lipid levels, obtaining regular exercise, and  continued cessation of smoking.  The patient is aware that without maximal medical management the underlying atherosclerotic disease process will progress, limiting the benefit of any interventions.  The patient was given information about PAD including signs, symptoms, treatment, what symptoms should prompt the patient to seek immediate medical care, and risk reduction measures to take.  Clemon Chambers, RN, MSN, FNP-C [facility] Phone: [phone]  Clinic MD: Donzetta Matters on call  06/25/17 3:13 PM

## 2017-06-25 NOTE — Patient Instructions (Signed)

## 2017-07-12 ENCOUNTER — Ambulatory Visit (INDEPENDENT_AMBULATORY_CARE_PROVIDER_SITE_OTHER): Payer: Medicare Other | Admitting: Orthopaedic Surgery

## 2017-07-12 ENCOUNTER — Encounter (INDEPENDENT_AMBULATORY_CARE_PROVIDER_SITE_OTHER): Payer: Self-pay | Admitting: Orthopaedic Surgery

## 2017-07-12 VITALS — BP 182/82 | HR 72 | Resp 16 | Ht 64.0 in | Wt 165.0 lb

## 2017-07-12 DIAGNOSIS — Z96651 Presence of right artificial knee joint: Secondary | ICD-10-CM | POA: Diagnosis not present

## 2017-07-12 NOTE — Progress Notes (Deleted)
Office Visit Note   Patient: [name]           Date of Birth: 04/04/1925           MRN: [id number] Visit Date: 07/12/2017              Requested by: Renaldo Reel, DO [address], [place] [address] PCP: Renaldo Reel, DO   Assessment & Plan: Visit Diagnoses:  1. History of total right knee replacement     Plan: ***  Follow-Up Instructions: Return if symptoms worsen or fail to improve.   Orders:  No orders of the defined types were placed in this encounter.  No orders of the defined types were placed in this encounter.     Procedures: No procedures performed   Clinical Data: No additional findings.   Subjective: No chief complaint on file.  -- HPI  Review of Systems  Constitutional: Positive for fatigue. Negative for fever.  HENT: Negative for ear pain.   Eyes: Negative for pain.  Respiratory: Negative for cough and shortness of breath.   Cardiovascular: Positive for leg swelling.  Gastrointestinal: Negative for constipation and diarrhea.  Genitourinary: Negative for difficulty urinating.  Musculoskeletal: Positive for neck pain. Negative for back pain.  Allergic/Immunologic: Negative for food allergies.  Neurological: Positive for numbness. Negative for weakness.  Hematological: Does not bruise/bleed easily.  Psychiatric/Behavioral: Negative for sleep disturbance.     Objective: Vital Signs: BP (!) 182/82 (BP Location: Right Arm, Patient Position: Sitting, Cuff Size: Normal)   Pulse 72   Physical Exam  Ortho Exam  Specialty Comments:  No specialty comments available.  Imaging: No results found.   PMFS History: Patient Active Problem List   Diagnosis Date Noted  . Pain in limb-Left calf 05/19/2013  . HTN (hypertension) 03/17/2013  . Iron deficiency anemia 03/17/2013  . HTN (hypertension), malignant 03/17/2013  . Urinary retention 03/17/2013  . Infection of total joint prosthesis ([place]) 03/16/2013  .  Infection of total knee replacement ([place]) 03/15/2013  . Injury of popliteal artery 09/23/2012  . Aftercare following surgery of the circulatory system, [place] 09/23/2012  . Peripheral vascular disease, unspecified ([place]) 09/23/2012  . Muscle soreness-Right popliteal 09/23/2012  . Septic arthritis ([place]) 05/18/2012  . Coagulase-negative staphylococcal infection 05/18/2012   Past Medical History:  Diagnosis Date  . Anxiety   . Arthritis    "joints" (03/16/2013)  . Breast cancer ([place])   . Bronchitis   . Chronic bronchitis ([place])    "yearly for the last 3-4 years" (03/16/2013)  . Complication of anesthesia   . Diverticulosis   . GERD (gastroesophageal reflux disease)   . Hiatal hernia   . High cholesterol   . Hypertension   . Hypothyroidism   . PONV (postoperative nausea and vomiting)   . S/P knee surgery    right knee surgery on 12-18  . Thyroid disease   . Uterine cancer ([place])     Family History  Problem Relation Age of Onset  . Diabetes Mother   . Heart disease Father   . Stroke Father   . Hypertension Father   . Heart attack Father   . Cancer Sister   . Diabetes Sister   . Hyperlipidemia Sister   . Hypertension Sister   . Cancer Brother   . Diabetes Brother   . Hypertension Brother   . Hyperlipidemia Daughter     Past Surgical History:  Procedure Laterality Date  . ABDOMINAL HYSTERECTOMY     "  left an ovary" (03/16/2013)  . APPENDECTOMY    . BREAST BIOPSY Left   . EYE SURGERY    . GLAUCOMA SURGERY Bilateral 03/01/2013-03/08/2013  . I&D EXTREMITY Right 03/15/2013   Procedure: Exploration right total knee replacement , removal of components, and insertion of antibiotic spacer.;  Surgeon: Garald Balding, MD;  Location: [place];  Service: Orthopedics;  Laterality: Right;  . I&D KNEE WITH POLY EXCHANGE  03/08/2012   Procedure: IRRIGATION AND DEBRIDEMENT KNEE WITH POLY EXCHANGE;  Surgeon: Garald Balding, MD;  Location: [place];  Service: Orthopedics;  Laterality: Right;   Right Total Knee Irrigation & Debridement, Synovectomy, Poly Exchange, Placement of Antibiotic Beads  . JOINT REPLACEMENT Right Dec. 10, 2013   Knee  . JOINT REPLACEMENT Right Dec. 17, 2014   Knee I & D  . MASTECTOMY Left ~ 2005  . PATCH ANGIOPLASTY Right 01/16/10   popliteal artery repair  . TOTAL KNEE  PROSTHESIS REMOVAL W/ SPACER INSERTION Right 03/15/2013  . TOTAL KNEE ARTHROPLASTY Left 2007  . TOTAL KNEE ARTHROPLASTY Right 01/17/2011   Social History   Occupational History  . Not on file  Tobacco Use  . Smoking status: Never Smoker  . Smokeless tobacco: Never Used  Substance and Sexual Activity  . Alcohol use: No    Alcohol/week: 0.0 oz  . Drug use: No  . Sexual activity: Not Currently

## 2017-07-12 NOTE — Progress Notes (Signed)
Office Visit Note   Patient: [name]           Date of Birth: 09-Sep-1925           MRN: [id number] Visit Date: 07/12/2017              Requested by: [name] [place], [place] [address] PCP: [name]   Assessment & Plan: Visit Diagnoses:  1. History of total right knee replacement     Plan: [name] is over 4 years status post removal of her right knee replacement and insertion of an antibiotic spacer.  She is been living with the antibiotic spacer for this time without any problem.  She is independent.  She drives.  She is able to bowl without a problem.  On occasion she is followed through the infectious disease department and has been on 500 mg of Keflex twice a day for over 4 years.  Her laboratory markers have been normal but she insists on continuing the medicine given the problem that she has had with her knee in the past with recurrent infection.  Recently she has had a little bit of discomfort and just wanted to come in to have it evaluated.  It looks fine to me she has excellent motion and function and no instability.  I will just plan to see her back as needed and continue with her activity as tolerated.  She wants to continue with a 500 mg of Keflex twice a day.  I did not obtain x-rays of her knee today.  I think she is doing very well and would expect that she may have some discomfort on a recurrent basis.  There is nothing to suggest that she has a recurrent infection or a significant problem with the indwelling antibiotic spacers.  We will plan to see back as needed  Follow-Up Instructions: Return if symptoms worsen or fail to improve.   Orders:  No orders of the defined types were placed in this encounter.  No orders of the defined types were placed in this encounter.     Procedures: No procedures performed   Clinical Data: No additional findings.   Subjective: No chief complaint on file. [name] had  a prior right total knee replacement.  She had recurrent infection with MSSA staph.  She had an initial irrigation debridement and polyethylene exchange with recurrent infection.  In December 2014 I remove the components and inserted an antibiotic spacer.  The spacer is still in place.  She has about 75 degrees of flexion.  She remains independent at age 84 driving her own car and bowling.  She has not had any fever chills.  Minimal discomfort.  She is being followed through the infectious disease department at [facility] frequent basis.  Her inflammatory markers have been normal.  She just came to the office just to have her knee reevaluated  HPI  Review of Systems   Objective: Vital Signs: BP (!) 182/82 (BP Location: Right Arm, Patient Position: Sitting, Cuff Size: Normal)   Pulse 72   Physical Exam  Ortho Exam awake alert and oriented x3.  Always very pleasant.  Right knee is perfectly stable in varus and valgus.  Negative anterior drawer sign.  Full quick extension and about 73 or 4 degrees of flexion.  Mild edema both calves without pain.  Neurologically intact.  Right knee was minimally warm compared to the left.  Mild discomfort along  the medial aspect of her knee but without any redness or ecchymosis.  Hip pain.  No calf pain.  Specialty Comments:  No specialty comments available.  Imaging: No results found.   PMFS History: Patient Active Problem List   Diagnosis Date Noted  . Pain in limb-Left calf 05/19/2013  . HTN (hypertension) 03/17/2013  . Iron deficiency anemia 03/17/2013  . HTN (hypertension), malignant 03/17/2013  . Urinary retention 03/17/2013  . Infection of total joint prosthesis ([place]) 03/16/2013  . Infection of total knee replacement ([place]) 03/15/2013  . Injury of popliteal artery 09/23/2012  . Aftercare following surgery of the circulatory system, [place] 09/23/2012  . Peripheral vascular disease, unspecified ([place]) 09/23/2012  . Muscle soreness-Right popliteal  09/23/2012  . Septic arthritis ([place]) 05/18/2012  . Coagulase-negative staphylococcal infection 05/18/2012   Past Medical History:  Diagnosis Date  . Anxiety   . Arthritis    "joints" (03/16/2013)  . Breast cancer ([place])   . Bronchitis   . Chronic bronchitis ([place])    "yearly for the last 3-4 years" (03/16/2013)  . Complication of anesthesia   . Diverticulosis   . GERD (gastroesophageal reflux disease)   . Hiatal hernia   . High cholesterol   . Hypertension   . Hypothyroidism   . PONV (postoperative nausea and vomiting)   . S/P knee surgery    right knee surgery on 12-18  . Thyroid disease   . Uterine cancer ([place])     Family History  Problem Relation Age of Onset  . Diabetes Mother   . Heart disease Father   . Stroke Father   . Hypertension Father   . Heart attack Father   . Cancer Sister   . Diabetes Sister   . Hyperlipidemia Sister   . Hypertension Sister   . Cancer Brother   . Diabetes Brother   . Hypertension Brother   . Hyperlipidemia Daughter     Past Surgical History:  Procedure Laterality Date  . ABDOMINAL HYSTERECTOMY     "left an ovary" (03/16/2013)  . APPENDECTOMY    . BREAST BIOPSY Left   . EYE SURGERY    . GLAUCOMA SURGERY Bilateral 03/01/2013-03/08/2013  . I&D EXTREMITY Right 03/15/2013   Procedure: Exploration right total knee replacement , removal of components, and insertion of antibiotic spacer.;  Surgeon: Garald Balding, MD;  Location: [place];  Service: Orthopedics;  Laterality: Right;  . I&D KNEE WITH POLY EXCHANGE  03/08/2012   Procedure: IRRIGATION AND DEBRIDEMENT KNEE WITH POLY EXCHANGE;  Surgeon: Garald Balding, MD;  Location: [place];  Service: Orthopedics;  Laterality: Right;  Right Total Knee Irrigation & Debridement, Synovectomy, Poly Exchange, Placement of Antibiotic Beads  . JOINT REPLACEMENT Right Dec. 10, 2013   Knee  . JOINT REPLACEMENT Right Dec. 17, 2014   Knee I & D  . MASTECTOMY Left ~ 2005  . PATCH ANGIOPLASTY Right  01/16/10   popliteal artery repair  . TOTAL KNEE  PROSTHESIS REMOVAL W/ SPACER INSERTION Right 03/15/2013  . TOTAL KNEE ARTHROPLASTY Left 2007  . TOTAL KNEE ARTHROPLASTY Right 01/17/2011   Social History   Occupational History  . Not on file  Tobacco Use  . Smoking status: Never Smoker  . Smokeless tobacco: Never Used  Substance and Sexual Activity  . Alcohol use: No    Alcohol/week: 0.0 oz  . Drug use: No  . Sexual activity: Not Currently     Garald Balding, MD   Note - This record has been  created using Dragon software.  Chart creation errors have been sought, but may not always  have been located. Such creation errors Orozco not reflect on  the standard of medical care.  

## 2017-09-20 ENCOUNTER — Encounter: Payer: Self-pay | Admitting: Infectious Disease

## 2017-09-20 ENCOUNTER — Ambulatory Visit (INDEPENDENT_AMBULATORY_CARE_PROVIDER_SITE_OTHER): Payer: Medicare Other | Admitting: Infectious Disease

## 2017-09-20 VITALS — BP 131/74 | HR 70 | Temp 98.2°F | Ht 64.0 in | Wt 169.0 lb

## 2017-09-20 DIAGNOSIS — Z96659 Presence of unspecified artificial knee joint: Secondary | ICD-10-CM

## 2017-09-20 DIAGNOSIS — M00061 Staphylococcal arthritis, right knee: Secondary | ICD-10-CM

## 2017-09-20 DIAGNOSIS — T8459XD Infection and inflammatory reaction due to other internal joint prosthesis, subsequent encounter: Secondary | ICD-10-CM

## 2017-09-20 DIAGNOSIS — B957 Other staphylococcus as the cause of diseases classified elsewhere: Secondary | ICD-10-CM | POA: Diagnosis not present

## 2017-09-20 NOTE — Progress Notes (Signed)
Subjective:   followup for PJI   Patient ID: [name], female    DOB: 09-12-1925, 84 y.o.   MRN: [id number]  HPI    84 yo F with hx of R TKR 10-09. Her surgery was complicated by popliteal artery injury requiring patch. Her course has also been complicated  She has had manipulation of her knee 04-2010, I & D/exchange of parts 03-09-2012. She had CoAg negative staph in her pre-operative Cx (R- pen). She was treated with ancef/rifampin then transitioned to keflex and rifampin. She again underwent manipulation of her knee 05-2012.  She was maintained on keflex/rifampin until 11-2012 when it was noted that her ESR and CRP were normal.  She then saw ortho  with swelling, pain and temp to 100. She had aspiration in office (Dr Durward Fortes) and was admitted on 03-15-13 and underwent removal of her prosthesis with placement of anbx spacer. Her Cx grew MSSE and she was d/c home on 12-24 on ancef. She was seen in ID f/u on 04/11/13 and had ESR 45 and CRP 0.6. She completed her IV anbx on 04-27-13 and then transitioned to po keflex. She was still having swelling and warmth in her knee. Has pain and swelling when she was on her leg too much (either sitting too long or walking a lot).     Her CRP and ESR were repeated on 06-13-13 (ESR 14 and CRP <0.5).    Her surgeon was quite worried about risk of recurrent infection as was  the patient. She has remained on oral Keflex since we last saw her.   I last saw her several years ago and she has maintained on keflex since.   She had  NOT been wanting to trial off of it even thought he prosthetic material that was removable has been taken out  I saw her again in May 2018 and again her inflammatory markers were normal.  She still wanted to stay on antibiotics.  I saw her again in November and she is doing well with minimal knee pain.  She still wanted to stay on the Keflex but I explained that with the prosthesis having been removed I felt it is worth having a trial  off of the anti-biotics and she agreed to do this if her inflammatory markers were normal.  He had a similar conversation every time I have seen her.   She has seen Dr. Durward Fortes who sees absolutely no indication for surgical intervention.  [name] states her knee only bothers her form time to time when she goes bowling.  Past Medical History:  Diagnosis Date  . Anxiety   . Arthritis    "joints" (03/16/2013)  . Breast cancer ([place])   . Bronchitis   . Chronic bronchitis ([place])    "yearly for the last 3-4 years" (03/16/2013)  . Complication of anesthesia   . Diverticulosis   . GERD (gastroesophageal reflux disease)   . Hiatal hernia   . High cholesterol   . Hypertension   . Hypothyroidism   . PONV (postoperative nausea and vomiting)   . S/P knee surgery    right knee surgery on 12-18  . Thyroid disease   . Uterine cancer [facility])     Past Surgical History:  Procedure Laterality Date  . ABDOMINAL HYSTERECTOMY     "left an ovary" (03/16/2013)  . APPENDECTOMY    . BREAST BIOPSY Left   . EYE SURGERY    . GLAUCOMA SURGERY Bilateral 03/01/2013-03/08/2013  . I&D EXTREMITY  Right 03/15/2013   Procedure: Exploration right total knee replacement , removal of components, and insertion of antibiotic spacer.;  Surgeon: Garald Balding, MD;  Location: [place];  Service: Orthopedics;  Laterality: Right;  . I&D KNEE WITH POLY EXCHANGE  03/08/2012   Procedure: IRRIGATION AND DEBRIDEMENT KNEE WITH POLY EXCHANGE;  Surgeon: Garald Balding, MD;  Location: [place];  Service: Orthopedics;  Laterality: Right;  Right Total Knee Irrigation & Debridement, Synovectomy, Poly Exchange, Placement of Antibiotic Beads  . JOINT REPLACEMENT Right Dec. 10, 2013   Knee  . JOINT REPLACEMENT Right Dec. 17, 2014   Knee I & D  . MASTECTOMY Left ~ 2005  . PATCH ANGIOPLASTY Right 01/16/10   popliteal artery repair  . TOTAL KNEE  PROSTHESIS REMOVAL W/ SPACER INSERTION Right 03/15/2013  . TOTAL KNEE ARTHROPLASTY  Left 2007  . TOTAL KNEE ARTHROPLASTY Right 01/17/2011    Family History  Problem Relation Age of Onset  . Diabetes Mother   . Heart disease Father   . Stroke Father   . Hypertension Father   . Heart attack Father   . Cancer Sister   . Diabetes Sister   . Hyperlipidemia Sister   . Hypertension Sister   . Cancer Brother   . Diabetes Brother   . Hypertension Brother   . Hyperlipidemia Daughter       Social History   Socioeconomic History  . Marital status: Divorced    Spouse name: Not on file  . Number of children: Not on file  . Years of education: Not on file  . Highest education level: Not on file  Occupational History  . Not on file  Social Needs  . Financial resource strain: Not on file  . Food insecurity:    Worry: Not on file    Inability: Not on file  . Transportation needs:    Medical: Not on file    Non-medical: Not on file  Tobacco Use  . Smoking status: Never Smoker  . Smokeless tobacco: Never Used  Substance and Sexual Activity  . Alcohol use: No    Alcohol/week: 0.0 oz  . Drug use: No  . Sexual activity: Not Currently  Lifestyle  . Physical activity:    Days per week: Not on file    Minutes per session: Not on file  . Stress: Not on file  Relationships  . Social connections:    Talks on phone: Not on file    Gets together: Not on file    Attends religious service: Not on file    Active member of club or organization: Not on file    Attends meetings of clubs or organizations: Not on file    Relationship status: Not on file  Other Topics Concern  . Not on file  Social History Narrative  . Not on file    Allergies  Allergen Reactions  . Naproxen     bleeding stomach  . Nsaids     Bleeding stomach  . Hydrocodone-Acetaminophen Other (See Comments)    Stomach bleeding     Current Outpatient Medications:  .  amLODipine (NORVASC) 5 MG tablet, Take 5 mg by mouth daily., Disp: , Rfl:  .  cephALEXin (KEFLEX) 500 MG capsule, Take 1  capsule (500 mg total) by mouth 2 (two) times daily., Disp: 120 capsule, Rfl: 7 .  docusate sodium (COLACE) 100 MG capsule, Take 100 mg by mouth 2 (two) times daily., Disp: , Rfl:  .  ergocalciferol (VITAMIN D2)  50000 UNITS capsule, Take 50,000 Units by mouth every 30 (thirty) days. Take on the 1st day of each month., Disp: , Rfl:  .  indapamide (LOZOL) 1.25 MG tablet, Take 1.25 mg by mouth every other day. Reported on 06/14/2015, Disp: , Rfl:  .  levothyroxine (SYNTHROID, LEVOTHROID) 50 MCG tablet, Take 50 mcg by mouth daily., Disp: , Rfl:  .  Magnesium Hydroxide (MILK OF MAGNESIA PO), Take 30 mLs by mouth daily as needed (constipation)., Disp: , Rfl:  .  Oxycodone HCl 10 MG TABS, Take 10 mg by mouth daily as needed (moderate to severe pain). , Disp: , Rfl:  .  pravastatin (PRAVACHOL) 40 MG tablet, Take 40 mg by mouth daily., Disp: , Rfl:  .  traMADol (ULTRAM) 50 MG tablet, Take 50 mg by mouth every 6 (six) hours as needed for moderate pain. , Disp: , Rfl:  .  aspirin 81 MG tablet, Take 81 mg by mouth daily., Disp: , Rfl:  .  LORazepam (ATIVAN) 0.5 MG tablet, Take 0.5 mg by mouth every 8 (eight) hours as needed for anxiety. Reported on 06/14/2015, Disp: , Rfl:       Review of Systems  Constitutional: Negative for activity change, appetite change, chills, diaphoresis, fatigue, fever and unexpected weight change.  HENT: Negative for congestion, rhinorrhea, sinus pressure, sneezing, sore throat and trouble swallowing.   Eyes: Negative for photophobia and visual disturbance.  Respiratory: Negative for cough, chest tightness, shortness of breath, wheezing and stridor.   Cardiovascular: Negative for chest pain, palpitations and leg swelling.  Gastrointestinal: Negative for abdominal distention, abdominal pain, anal bleeding, blood in stool, constipation, diarrhea, nausea and vomiting.  Genitourinary: Negative for difficulty urinating, dysuria, flank pain and hematuria.  Musculoskeletal: Positive for  joint swelling. Negative for back pain, gait problem and myalgias.  Skin: Negative for color change, pallor, rash and wound.  Neurological: Negative for dizziness, tremors, weakness and light-headedness.  Hematological: Negative for adenopathy. Does not bruise/bleed easily.  Psychiatric/Behavioral: Negative for agitation, behavioral problems, confusion, decreased concentration, dysphoric mood and sleep disturbance.       Objective:   Physical Exam  Constitutional: She is oriented to person, place, and time. She appears well-developed and well-nourished. No distress.  HENT:  Head: Normocephalic and atraumatic.  Mouth/Throat: No oropharyngeal exudate.  Eyes: Conjunctivae and EOM are normal. No scleral icterus.  Neck: Normal range of motion. Neck supple.  Cardiovascular: Normal rate and regular rhythm.  Pulmonary/Chest: Effort normal. No respiratory distress. She has no wheezes.  Abdominal: She exhibits no distension.  Musculoskeletal: She exhibits no edema or tenderness.  Neurological: She is alert and oriented to person, place, and time. She exhibits normal muscle tone. Coordination normal.  Skin: Skin is warm and dry. No rash noted. She is not diaphoretic. No erythema. No pallor.  Psychiatric: She has a normal mood and affect. Her behavior is normal. Judgment and thought content normal.  Nursing note and vitals reviewed.   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  Continue keflex. We can consider doxycycline as more narrow spectrum but she really wants to stay on the keflex at present

## 2018-03-09 ENCOUNTER — Ambulatory Visit: Payer: Medicare Other | Admitting: Infectious Disease

## 2018-03-11 ENCOUNTER — Ambulatory Visit: Payer: Medicare Other | Admitting: Infectious Disease

## 2018-04-08 ENCOUNTER — Encounter: Payer: Self-pay | Admitting: Infectious Disease

## 2018-04-08 ENCOUNTER — Ambulatory Visit: Payer: Medicare Other | Admitting: Infectious Disease

## 2018-04-08 VITALS — BP 154/66 | HR 58 | Temp 98.2°F | Wt 165.0 lb

## 2018-04-08 DIAGNOSIS — B957 Other staphylococcus as the cause of diseases classified elsewhere: Secondary | ICD-10-CM

## 2018-04-08 DIAGNOSIS — Z96659 Presence of unspecified artificial knee joint: Secondary | ICD-10-CM

## 2018-04-08 DIAGNOSIS — B958 Unspecified staphylococcus as the cause of diseases classified elsewhere: Secondary | ICD-10-CM | POA: Diagnosis not present

## 2018-04-08 DIAGNOSIS — T8450XD Infection and inflammatory reaction due to unspecified internal joint prosthesis, subsequent encounter: Secondary | ICD-10-CM

## 2018-04-08 DIAGNOSIS — M00061 Staphylococcal arthritis, right knee: Secondary | ICD-10-CM

## 2018-04-08 DIAGNOSIS — T8459XD Infection and inflammatory reaction due to other internal joint prosthesis, subsequent encounter: Secondary | ICD-10-CM

## 2018-04-08 NOTE — Progress Notes (Signed)
Subjective:   followup for PJI   Patient ID: [name], female    DOB: 11-07-1925, 85 y.o.   MRN: [id number]  HPI   85 yo F with hx of R TKR 10-09. Her surgery was complicated by popliteal artery injury requiring patch. Her course has also been complicated  She has had manipulation of her knee 04-2010, I & D/exchange of parts 03-09-2012. She had CoAg negative staph in her pre-operative Cx (R- pen). She was treated with ancef/rifampin then transitioned to keflex and rifampin. She again underwent manipulation of her knee 05-2012.  She was maintained on keflex/rifampin until 11-2012 when it was noted that her ESR and CRP were normal.  She then saw ortho  with swelling, pain and temp to 100. She had aspiration in office (Dr Durward Fortes) and was admitted on 03-15-13 and underwent removal of her prosthesis with placement of anbx spacer. Her Cx grew MSSE and she was d/c home on 12-24 on ancef. She was seen in ID f/u on 04/11/13 and had ESR 45 and CRP 0.6. She completed her IV anbx on 04-27-13 and then transitioned to po keflex. She was still having swelling and warmth in her knee. Has pain and swelling when she was on her leg too much (either sitting too long or walking a lot).     Her CRP and ESR were repeated on 06-13-13 (ESR 14 and CRP <0.5).    Her surgeon was quite worried about risk of recurrent infection as was  the patient. She has remained on oral Keflex since we last saw her.   I last saw her several years ago and she has maintained on keflex since.   She had  NOT been wanting to trial off of it even thought he prosthetic material that was removable has been taken out  I saw her again in May 2018 and again her inflammatory markers were normal.  She still wanted to stay on antibiotics.  I saw her again in November and she is doing well with minimal knee pain.  She still wanted to stay on the Keflex but I explained that with the prosthesis having been removed I felt it is worth having a trial  off of the anti-biotics and she agreed to do this if her inflammatory markers were normal.  He had a similar conversation every time I have seen her.   She has seen Dr. Durward Fortes who sees absolutely no indication for surgical intervention.  Last saw her in June she was still wanting to stay on the Keflex.  However either in July or August she decided to stop it after all and she has been off antibiotics since at least August now at least 4 months without it.  She has had no worsening of her knee pain.  She does still continue to take chronic opiates twice a day for it and has needed to but has not had any increased need for opiates versus before  Knee is also stable in terms of its appearance.  Fevers chills or other systemic symptoms   Past Medical History:  Diagnosis Date  . Anxiety   . Arthritis    "joints" (03/16/2013)  . Breast cancer ([place])   . Bronchitis   . Chronic bronchitis ([place])    "yearly for the last 3-4 years" (03/16/2013)  . Complication of anesthesia   . Diverticulosis   . GERD (gastroesophageal reflux disease)   . Hiatal hernia   . High cholesterol   . Hypertension   .  Hypothyroidism   . PONV (postoperative nausea and vomiting)   . S/P knee surgery    right knee surgery on 12-18  . Thyroid disease   . Uterine cancer [facility])     Past Surgical History:  Procedure Laterality Date  . ABDOMINAL HYSTERECTOMY     "left an ovary" (03/16/2013)  . APPENDECTOMY    . BREAST BIOPSY Left   . EYE SURGERY    . GLAUCOMA SURGERY Bilateral 03/01/2013-03/08/2013  . I&D EXTREMITY Right 03/15/2013   Procedure: Exploration right total knee replacement , removal of components, and insertion of antibiotic spacer.;  Surgeon: Garald Balding, MD;  Location: [facility];  Service: Orthopedics;  Laterality: Right;  . I&D KNEE WITH POLY EXCHANGE  03/08/2012   Procedure: IRRIGATION AND DEBRIDEMENT KNEE WITH POLY EXCHANGE;  Surgeon: Garald Balding, MD;  Location: [facility];  Service:  Orthopedics;  Laterality: Right;  Right Total Knee Irrigation & Debridement, Synovectomy, Poly Exchange, Placement of Antibiotic Beads  . JOINT REPLACEMENT Right Dec. 10, 2013   Knee  . JOINT REPLACEMENT Right Dec. 17, 2014   Knee I & D  . MASTECTOMY Left ~ 2005  . PATCH ANGIOPLASTY Right 01/16/10   popliteal artery repair  . TOTAL KNEE  PROSTHESIS REMOVAL W/ SPACER INSERTION Right 03/15/2013  . TOTAL KNEE ARTHROPLASTY Left 2007  . TOTAL KNEE ARTHROPLASTY Right 01/17/2011    Family History  Problem Relation Age of Onset  . Diabetes Mother   . Heart disease Father   . Stroke Father   . Hypertension Father   . Heart attack Father   . Cancer Sister   . Diabetes Sister   . Hyperlipidemia Sister   . Hypertension Sister   . Cancer Brother   . Diabetes Brother   . Hypertension Brother   . Hyperlipidemia Daughter       Social History   Socioeconomic History  . Marital status: Divorced    Spouse name: Not on file  . Number of children: Not on file  . Years of education: Not on file  . Highest education level: Not on file  Occupational History  . Not on file  Social Needs  . Financial resource strain: Not on file  . Food insecurity:    Worry: Not on file    Inability: Not on file  . Transportation needs:    Medical: Not on file    Non-medical: Not on file  Tobacco Use  . Smoking status: Never Smoker  . Smokeless tobacco: Never Used  Substance and Sexual Activity  . Alcohol use: No    Alcohol/week: 0.0 standard drinks  . Drug use: No  . Sexual activity: Not Currently  Lifestyle  . Physical activity:    Days per week: Not on file    Minutes per session: Not on file  . Stress: Not on file  Relationships  . Social connections:    Talks on phone: Not on file    Gets together: Not on file    Attends religious service: Not on file    Active member of club or organization: Not on file    Attends meetings of clubs or organizations: Not on file    Relationship status:  Not on file  Other Topics Concern  . Not on file  Social History Narrative  . Not on file    Allergies  Allergen Reactions  . Naproxen     bleeding stomach  . Nsaids     Bleeding stomach  .  Hydrocodone-Acetaminophen Other (See Comments)    Stomach bleeding     Current Outpatient Medications:  .  amLODipine (NORVASC) 5 MG tablet, Take 5 mg by mouth daily., Disp: , Rfl:  .  aspirin 81 MG tablet, Take 81 mg by mouth daily., Disp: , Rfl:  .  docusate sodium (COLACE) 100 MG capsule, Take 100 mg by mouth 2 (two) times daily., Disp: , Rfl:  .  ergocalciferol (VITAMIN D2) 50000 UNITS capsule, Take 50,000 Units by mouth every 30 (thirty) days. Take on the 1st day of each month., Disp: , Rfl:  .  indapamide (LOZOL) 1.25 MG tablet, Take 1.25 mg by mouth every other day. Reported on 06/14/2015, Disp: , Rfl:  .  levothyroxine (SYNTHROID, LEVOTHROID) 50 MCG tablet, Take 50 mcg by mouth daily., Disp: , Rfl:  .  LORazepam (ATIVAN) 0.5 MG tablet, Take 0.5 mg by mouth every 8 (eight) hours as needed for anxiety. Reported on 06/14/2015, Disp: , Rfl:  .  Oxycodone HCl 10 MG TABS, Take 10 mg by mouth daily as needed (moderate to severe pain). , Disp: , Rfl:  .  pravastatin (PRAVACHOL) 40 MG tablet, Take 40 mg by mouth daily., Disp: , Rfl:  .  traMADol (ULTRAM) 50 MG tablet, Take 50 mg by mouth every 6 (six) hours as needed for moderate pain. , Disp: , Rfl:  .  cephALEXin (KEFLEX) 500 MG capsule, Take 1 capsule (500 mg total) by mouth 2 (two) times daily. (Patient not taking: Reported on 04/08/2018), Disp: 120 capsule, Rfl: 7 .  Magnesium Hydroxide (MILK OF MAGNESIA PO), Take 30 mLs by mouth daily as needed (constipation)., Disp: , Rfl:       Review of Systems  Constitutional: Negative for activity change, appetite change, chills, diaphoresis, fatigue, fever and unexpected weight change.  HENT: Negative for congestion, rhinorrhea, sinus pressure, sneezing, sore throat and trouble swallowing.   Eyes:  Negative for photophobia and visual disturbance.  Respiratory: Negative for cough, chest tightness, shortness of breath, wheezing and stridor.   Cardiovascular: Negative for chest pain, palpitations and leg swelling.  Gastrointestinal: Negative for abdominal distention, abdominal pain, anal bleeding, blood in stool, constipation, diarrhea, nausea and vomiting.  Genitourinary: Negative for difficulty urinating, dysuria, flank pain and hematuria.  Musculoskeletal: Positive for joint swelling. Negative for back pain, gait problem and myalgias.  Skin: Negative for color change, pallor, rash and wound.  Neurological: Negative for dizziness, tremors, weakness and light-headedness.  Hematological: Negative for adenopathy. Does not bruise/bleed easily.  Psychiatric/Behavioral: Negative for agitation, behavioral problems, confusion, decreased concentration, dysphoric mood and sleep disturbance.       Objective:   Physical Exam Vitals signs and nursing note reviewed.  Constitutional:      General: She is not in acute distress.    Appearance: She is well-developed. She is not diaphoretic.  HENT:     Head: Normocephalic and atraumatic.     Mouth/Throat:     Pharynx: No oropharyngeal exudate.  Eyes:     General: No scleral icterus.    Conjunctiva/sclera: Conjunctivae normal.  Neck:     Musculoskeletal: Normal range of motion and neck supple.  Cardiovascular:     Rate and Rhythm: Normal rate and regular rhythm.  Pulmonary:     Effort: Pulmonary effort is normal. No respiratory distress.     Breath sounds: No wheezing.  Abdominal:     General: There is no distension.  Musculoskeletal:        General: No tenderness.  Skin:  General: Skin is warm and dry.     Coloration: Skin is not pale.     Findings: No erythema or rash.  Neurological:     Mental Status: She is alert and oriented to person, place, and time.     Motor: No abnormal muscle tone.     Coordination: Coordination normal.    Psychiatric:        Mood and Affect: Mood normal.        Behavior: Behavior normal.        Thought Content: Thought content normal.        Judgment: Judgment normal.     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  Now that she is off the Keflex will check sed rate C-reactive protein metabolic panel CBC with differential hopefully these are all reassuring.  If so we will see her back in 6 months time.

## 2018-04-09 LAB — C-REACTIVE PROTEIN: CRP: 3.6 mg/L (ref <8.0)

## 2018-04-09 LAB — CBC WITH DIFFERENTIAL/PLATELET
ABSOLUTE MONOCYTES: 480 {cells}/uL (ref 200–950)
BASOS PCT: 0.3 %
Basophils Absolute: 18 cells/uL (ref 0–200)
EOS ABS: 90 {cells}/uL (ref 15–500)
Eosinophils Relative: 1.5 %
HEMATOCRIT: 44 % (ref 35.0–45.0)
HEMOGLOBIN: 14.8 g/dL (ref 11.7–15.5)
LYMPHS ABS: 1488 {cells}/uL (ref 850–3900)
MCH: 29.8 pg (ref 27.0–33.0)
MCHC: 33.6 g/dL (ref 32.0–36.0)
MCV: 88.7 fL (ref 80.0–100.0)
MPV: 11.1 fL (ref 7.5–12.5)
Monocytes Relative: 8 %
NEUTROS ABS: 3924 {cells}/uL (ref 1500–7800)
Neutrophils Relative %: 65.4 %
Platelets: 232 10*3/uL (ref 140–400)
RBC: 4.96 10*6/uL (ref 3.80–5.10)
RDW: 13.1 % (ref 11.0–15.0)
Total Lymphocyte: 24.8 %
WBC: 6 10*3/uL (ref 3.8–10.8)

## 2018-04-09 LAB — BASIC METABOLIC PANEL WITH GFR
BUN/Creatinine Ratio: 12 (calc) (ref 6–22)
BUN: 14 mg/dL (ref 7–25)
CALCIUM: 9.2 mg/dL (ref 8.6–10.4)
CO2: 31 mmol/L (ref 20–32)
CREATININE: 1.14 mg/dL — AB (ref 0.60–0.88)
Chloride: 103 mmol/L (ref 98–110)
GFR, EST AFRICAN AMERICAN: 49 mL/min/{1.73_m2} — AB (ref >=60)
GFR, EST NON AFRICAN AMERICAN: 42 mL/min/{1.73_m2} — AB (ref >=60)
Glucose, Bld: 90 mg/dL (ref 65–99)
Potassium: 4.6 mmol/L (ref 3.5–5.3)
Sodium: 140 mmol/L (ref 135–146)

## 2018-04-09 LAB — SEDIMENTATION RATE: Sed Rate: 19 mm/h (ref 0–30)

## 2018-06-01 ENCOUNTER — Ambulatory Visit (INDEPENDENT_AMBULATORY_CARE_PROVIDER_SITE_OTHER): Payer: Medicare Other

## 2018-06-01 ENCOUNTER — Encounter (INDEPENDENT_AMBULATORY_CARE_PROVIDER_SITE_OTHER): Payer: Self-pay

## 2018-06-01 ENCOUNTER — Ambulatory Visit (INDEPENDENT_AMBULATORY_CARE_PROVIDER_SITE_OTHER): Payer: Medicare Other | Admitting: Orthopaedic Surgery

## 2018-06-01 ENCOUNTER — Encounter (INDEPENDENT_AMBULATORY_CARE_PROVIDER_SITE_OTHER): Payer: Self-pay | Admitting: Orthopaedic Surgery

## 2018-06-01 VITALS — BP 135/78 | HR 61 | Ht 64.0 in | Wt 165.0 lb

## 2018-06-01 DIAGNOSIS — G8929 Other chronic pain: Secondary | ICD-10-CM | POA: Diagnosis not present

## 2018-06-01 DIAGNOSIS — M25512 Pain in left shoulder: Secondary | ICD-10-CM

## 2018-06-01 MED ORDER — BUPIVACAINE HCL 0.5 % IJ SOLN
2.0000 mL | INTRAMUSCULAR | Status: AC | PRN
  Administered 2018-06-01: 2 mL via INTRA_ARTICULAR

## 2018-06-01 MED ORDER — METHYLPREDNISOLONE ACETATE 40 MG/ML IJ SUSP
80.0000 mg | INTRAMUSCULAR | Status: AC | PRN
  Administered 2018-06-01: 80 mg via INTRA_ARTICULAR

## 2018-06-01 MED ORDER — LIDOCAINE HCL 2 % IJ SOLN
2.0000 mL | INTRAMUSCULAR | Status: AC | PRN
  Administered 2018-06-01: 2 mL

## 2018-06-01 NOTE — Progress Notes (Signed)
Office Visit Note   Patient: [name]           Date of Birth: 16-Oct-1925           MRN: [id number] Visit Date: 06/01/2018              Requested by: Renaldo Reel, DO [address] [place], [place] [id number] PCP: Renaldo Reel, DO   Assessment & Plan: Visit Diagnoses:  1. Chronic left shoulder pain     Plan: Impingement syndrome left shoulder with possible rotator cuff tear.  Appears at the biceps long head tendon has ruptured.  Will inject subacromial space with cortisone and monitor response.  Initial response was significant improvement in pain and range of motion  Follow-Up Instructions: Return if symptoms worsen or fail to improve.   Orders:  Orders Placed This Encounter  Procedures  . Large Joint Inj: L subacromial bursa  . XR Shoulder Left   No orders of the defined types were placed in this encounter.     Procedures: Large Joint Inj: L subacromial bursa on 06/01/2018 11:56 AM Indications: pain and diagnostic evaluation Details: 25 G 1.5 in needle, anterolateral approach  Arthrogram: No  Medications: 2 mL lidocaine 2 %; 2 mL bupivacaine 0.5 %; 80 mg methylPREDNISolone acetate 40 MG/ML Outcome: tolerated well, no immediate complications Consent was given by the patient. Immediately prior to procedure a time out was called to verify the correct patient, procedure, equipment, support staff and site/side marked as required. Patient was prepped and draped in the usual sterile fashion.       Clinical Data: No additional findings.   Subjective: Chief Complaint  Patient presents with  . Left Shoulder - Pain  Patient presents today with left shoulder pain X 6weeks. No known injury. She said that she cannot lift her arm due to pain. She point to her shoulder and humerus as the area of pain. She is taking tramadol daily and an oxycodone at night. She has some numbness and tingling that travels down her arm.   HPI  Review of  Systems   Objective: Vital Signs: BP 135/78   Pulse 61   Ht 5\' 4"  (1.626 m)   Wt 165 lb (74.8 kg)   BMI 28.32 kg/m   Physical Exam Constitutional:      Appearance: She is well-developed.  Eyes:     Pupils: Pupils are equal, round, and reactive to light.  Pulmonary:     Effort: Pulmonary effort is normal.  Skin:    General: Skin is warm and dry.  Neurological:     Mental Status: She is alert and oriented to person, place, and time.  Psychiatric:        Behavior: Behavior normal.     Ortho Exam awake alert and oriented x3.  Significant limitation of motion left shoulder.  I can abduct and flex about 90 degrees at which point Mrs. Bellomo was uncomfortable.  Also had limited external rotation.  Positive impingement.  Positive empty can testing.  It looks like the biceps muscle is in a lower position.  No skin changes or ecchymosis.  Some tenderness along the anterior subacromial region.  No pain at the acromioclavicular joint.  Good grip and release  Specialty Comments:  No specialty comments available.  Imaging: Xr Shoulder Left  Result Date: 06/01/2018 Films of the left shoulder obtained in several projections.  There is some narrowing of the space between the humeral head and the acromion  him but the head is centered in the glenoid.  I measured about 9.3 mm.  Bulky degenerative changes at the chromic clavicular joint.  All surgical clips near the axilla.  Slight narrowing of the joint space between the glenoid and the humeral head.  No ectopic calcification.  Films could be consistent with primary osteoarthritis or possibly rotator cuff arthropathy    PMFS History: Patient Active Problem List   Diagnosis Date Noted  . Chronic left shoulder pain 06/01/2018  . Pain in limb-Left calf 05/19/2013  . HTN (hypertension) 03/17/2013  . Iron deficiency anemia 03/17/2013  . HTN (hypertension), malignant 03/17/2013  . Urinary retention 03/17/2013  . Infection of total joint  prosthesis ([place]) 03/16/2013  . Infection of total knee replacement ([place]) 03/15/2013  . Injury of popliteal artery 09/23/2012  . Aftercare following surgery of the circulatory system, [place] 09/23/2012  . Peripheral vascular disease, unspecified ([place]) 09/23/2012  . Muscle soreness-Right popliteal 09/23/2012  . Septic arthritis ([place]) 05/18/2012  . Coagulase-negative staphylococcal infection 05/18/2012   Past Medical History:  Diagnosis Date  . Anxiety   . Arthritis    "joints" (03/16/2013)  . Breast cancer ([place])   . Bronchitis   . Chronic bronchitis ([place])    "yearly for the last 3-4 years" (03/16/2013)  . Complication of anesthesia   . Diverticulosis   . GERD (gastroesophageal reflux disease)   . Hiatal hernia   . High cholesterol   . Hypertension   . Hypothyroidism   . PONV (postoperative nausea and vomiting)   . S/P knee surgery    right knee surgery on 12-18  . Thyroid disease   . Uterine cancer ([place])     Family History  Problem Relation Age of Onset  . Diabetes Mother   . Heart disease Father   . Stroke Father   . Hypertension Father   . Heart attack Father   . Cancer Sister   . Diabetes Sister   . Hyperlipidemia Sister   . Hypertension Sister   . Cancer Brother   . Diabetes Brother   . Hypertension Brother   . Hyperlipidemia Daughter     Past Surgical History:  Procedure Laterality Date  . ABDOMINAL HYSTERECTOMY     "left an ovary" (03/16/2013)  . APPENDECTOMY    . BREAST BIOPSY Left   . EYE SURGERY    . GLAUCOMA SURGERY Bilateral 03/01/2013-03/08/2013  . I&D EXTREMITY Right 03/15/2013   Procedure: Exploration right total knee replacement , removal of components, and insertion of antibiotic spacer.;  Surgeon: Garald Balding, MD;  Location: [facility];  Service: Orthopedics;  Laterality: Right;  . I&D KNEE WITH POLY EXCHANGE  03/08/2012   Procedure: IRRIGATION AND DEBRIDEMENT KNEE WITH POLY EXCHANGE;  Surgeon: Garald Balding, MD;  Location: [facility];  Service:  Orthopedics;  Laterality: Right;  Right Total Knee Irrigation & Debridement, Synovectomy, Poly Exchange, Placement of Antibiotic Beads  . JOINT REPLACEMENT Right Dec. 10, 2013   Knee  . JOINT REPLACEMENT Right Dec. 17, 2014   Knee I & D  . MASTECTOMY Left ~ 2005  . PATCH ANGIOPLASTY Right 01/16/10   popliteal artery repair  . TOTAL KNEE  PROSTHESIS REMOVAL W/ SPACER INSERTION Right 03/15/2013  . TOTAL KNEE ARTHROPLASTY Left 2007  . TOTAL KNEE ARTHROPLASTY Right 01/17/2011   Social History   Occupational History  . Not on file  Tobacco Use  . Smoking status: Never Smoker  . Smokeless tobacco: Never Used  Substance and Sexual Activity  .  Alcohol use: No    Alcohol/week: 0.0 standard drinks  . Drug use: No  . Sexual activity: Not Currently

## 2018-07-04 ENCOUNTER — Other Ambulatory Visit (HOSPITAL_COMMUNITY): Payer: Medicare Other

## 2018-07-04 ENCOUNTER — Encounter (HOSPITAL_COMMUNITY): Payer: Medicare Other

## 2018-07-04 ENCOUNTER — Ambulatory Visit: Payer: Medicare Other | Admitting: Family

## 2018-09-09 ENCOUNTER — Other Ambulatory Visit: Payer: Self-pay

## 2018-09-09 DIAGNOSIS — S85001D Unspecified injury of popliteal artery, right leg, subsequent encounter: Secondary | ICD-10-CM

## 2018-09-09 DIAGNOSIS — I739 Peripheral vascular disease, unspecified: Secondary | ICD-10-CM

## 2018-09-13 ENCOUNTER — Other Ambulatory Visit: Payer: Self-pay

## 2018-09-13 ENCOUNTER — Ambulatory Visit (INDEPENDENT_AMBULATORY_CARE_PROVIDER_SITE_OTHER)
Admission: RE | Admit: 2018-09-13 | Discharge: 2018-09-13 | Disposition: A | Discharge status: Home / Self Care | Payer: Medicare Other | Source: Ambulatory Visit | Attending: Vascular Surgery | Admitting: Vascular Surgery

## 2018-09-13 ENCOUNTER — Encounter: Payer: Self-pay | Admitting: Vascular Surgery

## 2018-09-13 ENCOUNTER — Ambulatory Visit (HOSPITAL_COMMUNITY)
Admission: RE | Admit: 2018-09-13 | Discharge: 2018-09-13 | Disposition: A | Discharge status: Home / Self Care | Payer: Medicare Other | Source: Ambulatory Visit | Attending: Vascular Surgery | Admitting: Vascular Surgery

## 2018-09-13 ENCOUNTER — Ambulatory Visit (INDEPENDENT_AMBULATORY_CARE_PROVIDER_SITE_OTHER): Payer: Medicare Other | Admitting: Vascular Surgery

## 2018-09-13 VITALS — BP 149/78 | HR 60 | Temp 98.6°F | Resp 14 | Ht 63.0 in | Wt 163.0 lb

## 2018-09-13 DIAGNOSIS — I739 Peripheral vascular disease, unspecified: Secondary | ICD-10-CM | POA: Diagnosis present

## 2018-09-13 DIAGNOSIS — S85001D Unspecified injury of popliteal artery, right leg, subsequent encounter: Secondary | ICD-10-CM | POA: Diagnosis present

## 2018-09-13 NOTE — Progress Notes (Signed)
Patient name: [name] MRN: [id number] DOB: [birth year] Sex: female  REASON FOR VISIT: 1 year follow-up for right lower extremity surveillance after remote popliteal artery iatrogenic injury in 2011  HPI: [name] is a 85 y.o. female who presents for one-year follow-up after bovine patch angioplasty of an iatrogenically injured right popliteal artery in 2011 by Dr. Bridgett Larsson.  She reports no issues over the last year.  No right lower extremity tissue loss or rest pain or claudication symptoms.  Otherwise is walking without any significant issues.  States she does not smoke.  Past Medical History:  Diagnosis Date  . Anxiety   . Arthritis    "joints" (03/16/2013)  . Breast cancer ([place])   . Bronchitis   . Chronic bronchitis ([place])    "yearly for the last 3-4 years" (03/16/2013)  . Complication of anesthesia   . Diverticulosis   . GERD (gastroesophageal reflux disease)   . Hiatal hernia   . High cholesterol   . Hypertension   . Hypothyroidism   . PONV (postoperative nausea and vomiting)   . S/P knee surgery    right knee surgery on 12-18  . Thyroid disease   . Uterine cancer [facility])     Past Surgical History:  Procedure Laterality Date  . ABDOMINAL HYSTERECTOMY     "left an ovary" (03/16/2013)  . APPENDECTOMY    . BREAST BIOPSY Left   . EYE SURGERY    . GLAUCOMA SURGERY Bilateral 03/01/2013-03/08/2013  . I&D EXTREMITY Right 03/15/2013   Procedure: Exploration right total knee replacement , removal of components, and insertion of antibiotic spacer.;  Surgeon: Garald Balding, MD;  Location: [facility];  Service: Orthopedics;  Laterality: Right;  . I&D KNEE WITH POLY EXCHANGE  03/08/2012   Procedure: IRRIGATION AND DEBRIDEMENT KNEE WITH POLY EXCHANGE;  Surgeon: Garald Balding, MD;  Location: [facility];  Service: Orthopedics;  Laterality: Right;  Right Total Knee Irrigation & Debridement, Synovectomy, Poly Exchange, Placement of Antibiotic Beads  . JOINT REPLACEMENT Right Dec. 10,  2013   Knee  . JOINT REPLACEMENT Right Dec. 17, 2014   Knee I & D  . MASTECTOMY Left ~ 2005  . PATCH ANGIOPLASTY Right 01/16/10   popliteal artery repair  . TOTAL KNEE  PROSTHESIS REMOVAL W/ SPACER INSERTION Right 03/15/2013  . TOTAL KNEE ARTHROPLASTY Left 2007  . TOTAL KNEE ARTHROPLASTY Right 01/17/2011    Family History  Problem Relation Age of Onset  . Diabetes Mother   . Heart disease Father   . Stroke Father   . Hypertension Father   . Heart attack Father   . Cancer Sister   . Diabetes Sister   . Hyperlipidemia Sister   . Hypertension Sister   . Cancer Brother   . Diabetes Brother   . Hypertension Brother   . Hyperlipidemia Daughter     SOCIAL HISTORY: Social History   Tobacco Use  . Smoking status: Never Smoker  . Smokeless tobacco: Never Used  Substance Use Topics  . Alcohol use: No    Alcohol/week: 0.0 standard drinks    Allergies  Allergen Reactions  . Naproxen     bleeding stomach  . Nsaids Other (See Comments)    Bleeding stomach GI- diverticulitis  . Hydrocodone-Acetaminophen Other (See Comments)    Stomach bleeding    Current Outpatient Medications  Medication Sig Dispense Refill  . amLODipine (NORVASC) 5 MG tablet Take 5 mg by mouth daily.    Marland Kitchen aspirin 81 MG tablet Take  81 mg by mouth daily.    Marland Kitchen docusate sodium (COLACE) 100 MG capsule Take 100 mg by mouth 2 (two) times daily.    . ergocalciferol (VITAMIN D2) 50000 UNITS capsule Take 50,000 Units by mouth every 30 (thirty) days. Take on the 1st day of each month.    . indapamide (LOZOL) 1.25 MG tablet Take 1.25 mg by mouth every other day. Reported on 06/14/2015    . levothyroxine (SYNTHROID, LEVOTHROID) 50 MCG tablet Take 50 mcg by mouth daily.    Marland Kitchen LORazepam (ATIVAN) 0.5 MG tablet Take 0.5 mg by mouth every 8 (eight) hours as needed for anxiety. Reported on 06/14/2015    . Magnesium Hydroxide (MILK OF MAGNESIA PO) Take 30 mLs by mouth daily as needed (constipation).    . Oxycodone HCl 10 MG  TABS Take 10 mg by mouth daily as needed (moderate to severe pain).     . pravastatin (PRAVACHOL) 40 MG tablet Take 40 mg by mouth daily.    . traMADol (ULTRAM) 50 MG tablet Take 50 mg by mouth every 6 (six) hours as needed for moderate pain.      No current facility-administered medications for this visit.     REVIEW OF SYSTEMS:  [X]  denotes positive finding, [ ]  denotes negative finding Cardiac  Comments:  Chest pain or chest pressure:    Shortness of breath upon exertion:    Short of breath when lying flat:    Irregular heart rhythm:        Vascular    Pain in calf, thigh, or hip brought on by ambulation:    Pain in feet at night that wakes you up from your sleep:     Blood clot in your veins:    Leg swelling:         Pulmonary    Oxygen at home:    Productive cough:     Wheezing:         Neurologic    Sudden weakness in arms or legs:     Sudden numbness in arms or legs:     Sudden onset of difficulty speaking or slurred speech:    Temporary loss of vision in one eye:     Problems with dizziness:         Gastrointestinal    Blood in stool:     Vomited blood:         Genitourinary    Burning when urinating:     Blood in urine:        Psychiatric    Major depression:         Hematologic    Bleeding problems:    Problems with blood clotting too easily:        Skin    Rashes or ulcers:        Constitutional    Fever or chills:      PHYSICAL EXAM: Vitals:   09/13/18 1434  BP: (!) 149/78  Pulse: 60  Resp: 14  Temp: 98.6 F (37 C)  TempSrc: Temporal  SpO2: 98%  Weight: 163 lb (73.9 kg)  Height: 5\' 3"  (1.6 m)    GENERAL: The patient is a well-nourished female, in no acute distress. The vital signs are documented above. CARDIAC: There is a regular rate and rhythm.  VASCULAR:  2+ femoral pulse palpable bilaterally 2+ DP pulse palpable bilaterally PULMONARY: There is good air exchange bilaterally without wheezing or rales. ABDOMEN: Soft and  non-tender with normal pitched bowel sounds.  MUSCULOSKELETAL: There are no major deformities or cyanosis. NEUROLOGIC: No focal weakness or paresthesias are detected.   DATA:   ABIs 1.33 on the right 1.41 on the left with triphasic waveforms and excellent toe pressures.  Right lower extremity duplex shows its widely patent with no flow-limiting stenosis.  Assessment/Plan:  85 year old female who suffered iatrogenic right popliteal artery injury requiring patch angioplasty in 2011 by Dr. Bridgett Larsson.  She has preserved ABIs with triphasic waveforms at the ankle and a palpable pulse in the foot with no symptoms.  I think overall she is doing well and there is no flow-limiting stenosis that warrants intervention.  I will plan to have her come back in 1 year for ABIs and right lower extremity duplex for ongoing surveillance.  Call with questions or concerns.   Marty Heck, MD [facility] Office: [phone] Pager: [phone]

## 2018-11-03 ENCOUNTER — Encounter (HOSPITAL_COMMUNITY): Payer: Self-pay | Admitting: *Deleted

## 2018-11-03 ENCOUNTER — Emergency Department (HOSPITAL_COMMUNITY)
Admission: EM | Admit: 2018-11-03 | Discharge: 2018-11-04 | Disposition: A | Discharge status: Home / Self Care | Payer: Medicare Other | Attending: Emergency Medicine | Admitting: Emergency Medicine

## 2018-11-03 ENCOUNTER — Other Ambulatory Visit: Payer: Self-pay

## 2018-11-03 ENCOUNTER — Emergency Department (HOSPITAL_COMMUNITY): Payer: Medicare Other

## 2018-11-03 DIAGNOSIS — Z5321 Procedure and treatment not carried out due to patient leaving prior to being seen by health care provider: Secondary | ICD-10-CM | POA: Insufficient documentation

## 2018-11-03 DIAGNOSIS — M546 Pain in thoracic spine: Secondary | ICD-10-CM | POA: Diagnosis present

## 2018-11-03 LAB — CBC
HCT: 46.2 % — ABNORMAL HIGH (ref 36.0–46.0)
Hemoglobin: 15.7 g/dL — ABNORMAL HIGH (ref 12.0–15.0)
MCH: 30.8 pg (ref 26.0–34.0)
MCHC: 34 g/dL (ref 30.0–36.0)
MCV: 90.6 fL (ref 80.0–100.0)
Platelets: 232 10*3/uL (ref 150–400)
RBC: 5.1 MIL/uL (ref 3.87–5.11)
RDW: 13.2 % (ref 11.5–15.5)
WBC: 8.3 10*3/uL (ref 4.0–10.5)
nRBC: 0 % (ref 0.0–0.2)

## 2018-11-03 LAB — BASIC METABOLIC PANEL
Anion gap: 11 (ref 5–15)
BUN: 16 mg/dL (ref 8–23)
CO2: 23 mmol/L (ref 22–32)
Calcium: 9.2 mg/dL (ref 8.9–10.3)
Chloride: 102 mmol/L (ref 98–111)
Creatinine, Ser: 1.13 mg/dL — ABNORMAL HIGH (ref 0.44–1.00)
GFR calc Af Amer: 50 mL/min — ABNORMAL LOW (ref >60)
GFR calc non Af Amer: 43 mL/min — ABNORMAL LOW (ref >60)
Glucose, Bld: 105 mg/dL — ABNORMAL HIGH (ref 70–99)
Potassium: 4.1 mmol/L (ref 3.5–5.1)
Sodium: 136 mmol/L (ref 135–145)

## 2018-11-03 LAB — TROPONIN I (HIGH SENSITIVITY): Troponin I (High Sensitivity): 5 ng/L (ref <18)

## 2018-11-03 MED ORDER — SODIUM CHLORIDE 0.9% FLUSH: 3.0000 mL | Freq: Once | INTRAVENOUS | Status: DC

## 2018-11-03 NOTE — ED Triage Notes (Addendum)
To ED for eval of upper right back pain since 3pm today. Pt states she was not doing anything when pain started. No nausea. No sob. No increase in pain with movement. Called her pcp who wanted cardiac ruled out so pt came to [facility]. Accompanied by her daughter. Lives in [place]

## 2018-11-04 ENCOUNTER — Encounter (HOSPITAL_BASED_OUTPATIENT_CLINIC_OR_DEPARTMENT_OTHER): Payer: Self-pay

## 2018-11-04 ENCOUNTER — Emergency Department (HOSPITAL_BASED_OUTPATIENT_CLINIC_OR_DEPARTMENT_OTHER)
Admission: EM | Admit: 2018-11-04 | Discharge: 2018-11-04 | Disposition: A | Discharge status: Home / Self Care | Payer: Medicare Other | Source: Home / Self Care | Attending: Emergency Medicine | Admitting: Emergency Medicine

## 2018-11-04 ENCOUNTER — Other Ambulatory Visit: Payer: Self-pay

## 2018-11-04 ENCOUNTER — Emergency Department (HOSPITAL_BASED_OUTPATIENT_CLINIC_OR_DEPARTMENT_OTHER): Payer: Medicare Other

## 2018-11-04 DIAGNOSIS — Z7982 Long term (current) use of aspirin: Secondary | ICD-10-CM | POA: Insufficient documentation

## 2018-11-04 DIAGNOSIS — Z853 Personal history of malignant neoplasm of breast: Secondary | ICD-10-CM | POA: Insufficient documentation

## 2018-11-04 DIAGNOSIS — Z79899 Other long term (current) drug therapy: Secondary | ICD-10-CM | POA: Insufficient documentation

## 2018-11-04 DIAGNOSIS — I10 Essential (primary) hypertension: Secondary | ICD-10-CM | POA: Insufficient documentation

## 2018-11-04 DIAGNOSIS — M546 Pain in thoracic spine: Secondary | ICD-10-CM

## 2018-11-04 DIAGNOSIS — Z96653 Presence of artificial knee joint, bilateral: Secondary | ICD-10-CM | POA: Insufficient documentation

## 2018-11-04 DIAGNOSIS — E039 Hypothyroidism, unspecified: Secondary | ICD-10-CM | POA: Insufficient documentation

## 2018-11-04 LAB — URINALYSIS, ROUTINE W REFLEX MICROSCOPIC
Bilirubin Urine: NEGATIVE
Glucose, UA: NEGATIVE mg/dL
Hgb urine dipstick: NEGATIVE
Ketones, ur: NEGATIVE mg/dL
Nitrite: NEGATIVE
Protein, ur: NEGATIVE mg/dL
Specific Gravity, Urine: 1.01 (ref 1.005–1.030)
pH: 6 (ref 5.0–8.0)

## 2018-11-04 LAB — URINALYSIS, MICROSCOPIC (REFLEX)

## 2018-11-04 LAB — TROPONIN I (HIGH SENSITIVITY): Troponin I (High Sensitivity): 6 ng/L (ref <18)

## 2018-11-04 LAB — D-DIMER, QUANTITATIVE: D-Dimer, Quant: 1.1 ug/mL-FEU — ABNORMAL HIGH (ref 0.00–0.50)

## 2018-11-04 MED ORDER — TRAMADOL HCL 50 MG PO TABS
50.0000 mg | ORAL_TABLET | Freq: Once | ORAL | Status: AC
  Administered 2018-11-04: 50 mg via ORAL
  Filled 2018-11-04: qty 1

## 2018-11-04 MED ORDER — TRAMADOL HCL 50 MG PO TABS: 50.0000 mg | ORAL_TABLET | Freq: Four times a day (QID) | ORAL | 0 refills | Status: AC | PRN

## 2018-11-04 MED ORDER — IOHEXOL 350 MG/ML SOLN
100.0000 mL | Freq: Once | INTRAVENOUS | Status: AC | PRN
  Administered 2018-11-04: 100 mL via INTRAVENOUS

## 2018-11-04 NOTE — ED Notes (Signed)
Pt to CT at this time.

## 2018-11-04 NOTE — ED Notes (Signed)
pts family member stated they are taking pt to [facility].

## 2018-11-04 NOTE — ED Notes (Signed)
Pt returned from CT °

## 2018-11-04 NOTE — ED Triage Notes (Signed)
Back pain since 3pm. Was at [facility] earlier but left due to wait times.

## 2018-11-04 NOTE — ED Notes (Signed)
MD at bedside discussing results with patient 

## 2018-11-04 NOTE — ED Notes (Signed)
Called lab to inform of urine order.

## 2018-11-04 NOTE — ED Provider Notes (Addendum)
[facility] Provider Note: Georgena Spurling, MD, FACEP  CSN: [id number] MRN: [id number] ARRIVAL: 11/04/18 at [place]: [name]  Back Pain   HISTORY OF PRESENT ILLNESS  11/04/18 3:11 AM [name] is a 85 y.o. female who developed right-sided mid back pain yesterday afternoon about 3 PM.  She did not injure her back and is not aware of anything that may have triggered this pain.  Pain is moderate to severe and aching in nature.  It is more medial than lateral but not midline.  It does not change with movement or palpation.  There is no associated rash, numbness or hyperesthesia.  She denies shortness of breath, chest pain, abdominal pain, nausea, vomiting or diarrhea.  She checked into [name] about 10 PM yesterday evening where a work-up was started but she was never seen by a physician or APP.  She left and came over here.   Past Medical History:  Diagnosis Date  . Anxiety   . Arthritis    "joints" (03/16/2013)  . Breast cancer ([place])   . Bronchitis   . Chronic bronchitis ([place])    "yearly for the last 3-4 years" (03/16/2013)  . Complication of anesthesia   . Diverticulosis   . GERD (gastroesophageal reflux disease)   . Hiatal hernia   . High cholesterol   . Hypertension   . Hypothyroidism   . PONV (postoperative nausea and vomiting)   . S/P knee surgery    right knee surgery on 12-18  . Thyroid disease   . Uterine cancer [facility])     Past Surgical History:  Procedure Laterality Date  . ABDOMINAL HYSTERECTOMY     "left an ovary" (03/16/2013)  . APPENDECTOMY    . BREAST BIOPSY Left   . EYE SURGERY    . GLAUCOMA SURGERY Bilateral 03/01/2013-03/08/2013  . I&D EXTREMITY Right 03/15/2013   Procedure: Exploration right total knee replacement , removal of components, and insertion of antibiotic spacer.;  Surgeon: Garald Balding, MD;  Location: [place];  Service: Orthopedics;  Laterality: Right;  . I&D KNEE WITH POLY EXCHANGE   03/08/2012   Procedure: IRRIGATION AND DEBRIDEMENT KNEE WITH POLY EXCHANGE;  Surgeon: Garald Balding, MD;  Location: [place];  Service: Orthopedics;  Laterality: Right;  Right Total Knee Irrigation & Debridement, Synovectomy, Poly Exchange, Placement of Antibiotic Beads  . JOINT REPLACEMENT Right Dec. 10, 2013   Knee  . JOINT REPLACEMENT Right Dec. 17, 2014   Knee I & D  . MASTECTOMY Left ~ 2005  . PATCH ANGIOPLASTY Right 01/16/10   popliteal artery repair  . TOTAL KNEE  PROSTHESIS REMOVAL W/ SPACER INSERTION Right 03/15/2013  . TOTAL KNEE ARTHROPLASTY Left 2007  . TOTAL KNEE ARTHROPLASTY Right 01/17/2011    Family History  Problem Relation Age of Onset  . Diabetes Mother   . Heart disease Father   . Stroke Father   . Hypertension Father   . Heart attack Father   . Cancer Sister   . Diabetes Sister   . Hyperlipidemia Sister   . Hypertension Sister   . Cancer Brother   . Diabetes Brother   . Hypertension Brother   . Hyperlipidemia Daughter     Social History   Tobacco Use  . Smoking status: Never Smoker  . Smokeless tobacco: Never Used  Substance Use Topics  . Alcohol use: No    Alcohol/week: 0.0 standard drinks  . Drug use: No  Prior to Admission medications   Medication Sig Start Date End Date Taking? Authorizing Provider  amLODipine (NORVASC) 5 MG tablet Take 5 mg by mouth daily.    [provider]  aspirin 81 MG tablet Take 81 mg by mouth daily.    [provider]  docusate sodium (COLACE) 100 MG capsule Take 100 mg by mouth 2 (two) times daily.    [provider]  ergocalciferol (VITAMIN D2) 50000 UNITS capsule Take 50,000 Units by mouth every 30 (thirty) days. Take on the 1st day of each month.    [provider]  indapamide (LOZOL) 1.25 MG tablet Take 1.25 mg by mouth every other day. Reported on 06/14/2015    [provider]  levothyroxine (SYNTHROID, LEVOTHROID) 50 MCG tablet Take 50 mcg by mouth daily.     [provider]  LORazepam (ATIVAN) 0.5 MG tablet Take 0.5 mg by mouth every 8 (eight) hours as needed for anxiety. Reported on 06/14/2015 05/24/13   [provider]  Magnesium Hydroxide (MILK OF MAGNESIA PO) Take 30 mLs by mouth daily as needed (constipation).    [provider]  Oxycodone HCl 10 MG TABS Take 10 mg by mouth daily as needed (moderate to severe pain).     [provider]  pravastatin (PRAVACHOL) 40 MG tablet Take 40 mg by mouth daily.    [provider]  traMADol (ULTRAM) 50 MG tablet Take 50 mg by mouth every 6 (six) hours as needed for moderate pain.  04/28/13   [provider]    Allergies Naproxen, Nsaids, and Hydrocodone-acetaminophen   REVIEW OF SYSTEMS  Negative except as noted here or in the History of Present Illness.   PHYSICAL EXAMINATION  Initial Vital Signs There were no vitals taken for this visit.  Examination General: Well-developed, well-nourished female in no acute distress; appears younger than age of record HENT: normocephalic; atraumatic Eyes: pupils equal, round and reactive to light; extraocular muscles intact; bilateral pseudophakia; bilateral arcus senilis Neck: supple Heart: regular rate and rhythm; no murmur Lungs: clear to auscultation bilaterally Abdomen: soft; nondistended; nontender; no masses or hepatosplenomegaly; bowel sounds present Back: No spinal or paraspinal tenderness; no pain on flexion extension of back Extremities: Chronic appearing deformity of right knee with decreased range of motion;; pulses normal; no edema; no calf tenderness Neurologic: Awake, alert and oriented; motor function intact in all extremities and symmetric; no facial droop Skin: Warm and dry; no rash or hyperesthesia in area of back pain Psychiatric: Normal mood and affect   RESULTS  Summary of this visit's results, reviewed by myself:  Laboratory Studies: Results for orders placed or performed during  the hospital encounter of 11/04/18 (from the past 24 hour(s))  Urinalysis, Routine w reflex microscopic     Status: Abnormal   Collection Time: 11/04/18  3:10 AM  Result Value Ref Range   Color, Urine YELLOW YELLOW   APPearance CLEAR CLEAR   Specific Gravity, Urine 1.010 1.005 - 1.030   pH 6.0 5.0 - 8.0   Glucose, UA NEGATIVE NEGATIVE mg/dL   Hgb urine dipstick NEGATIVE NEGATIVE   Bilirubin Urine NEGATIVE NEGATIVE   Ketones, ur NEGATIVE NEGATIVE mg/dL   Protein, ur NEGATIVE NEGATIVE mg/dL   Nitrite NEGATIVE NEGATIVE   Leukocytes,Ua TRACE (A) NEGATIVE  Urinalysis, Microscopic (reflex)     Status: Abnormal   Collection Time: 11/04/18  3:10 AM  Result Value Ref Range   RBC / HPF 0-5 0 - 5 RBC/hpf   WBC, UA  0-5 0 - 5 WBC/hpf   Bacteria, UA FEW (A) NONE SEEN   Squamous Epithelial / LPF 0-5 0 - 5  D-dimer, quantitative (not at [facility])     Status: Abnormal   Collection Time: 11/04/18  4:12 AM  Result Value Ref Range   D-Dimer, Quant 1.10 (H) 0.00 - 0.50 ug/mL-FEU   Imaging Studies: Dg Chest 2 View  Result Date: 11/03/2018 CLINICAL DATA:  Chest pain for several hours, no known injury, initial encounter EXAM: CHEST - 2 VIEW COMPARISON:  03/15/2013 FINDINGS: The heart size and mediastinal contours are within normal limits. Both lungs are clear. The visualized skeletal structures are unremarkable. Changes of prior left mastectomy are noted. IMPRESSION: No active cardiopulmonary disease. Electronically Signed   By: Inez Catalina M.D.   On: 11/03/2018 22:54   Ct Angio Chest Pe W And/or Wo Contrast  Result Date: 11/04/2018 CLINICAL DATA:  Elevated D-dimer EXAM: CT ANGIOGRAPHY CHEST WITH CONTRAST TECHNIQUE: Multidetector CT imaging of the chest was performed using the standard protocol during bolus administration of intravenous contrast. Multiplanar CT image reconstructions and MIPs were obtained to evaluate the vascular anatomy. CONTRAST:  173mL OMNIPAQUE IOHEXOL 350 MG/ML SOLN COMPARISON:  None.  FINDINGS: Cardiovascular: Mild cardiomegaly. No pericardial effusion. No pulmonary artery filling defect. Mild for age atherosclerotic calcification Mediastinum/Nodes: Granulomatous calcification within a pre-vascular lymph node. No worrisome nodal enlargement Lungs/Pleura: Mild atelectasis. There is no edema, consolidation, effusion, or pneumothorax. Upper Abdomen: No acute finding Musculoskeletal: No acute or aggressive finding. Simple appearing lipoma within the right pectoralis major. Other: Left mastectomy and axillary dissection. Review of the MIP images confirms the above findings. IMPRESSION: Negative for pulmonary embolism or other acute finding. Electronically Signed   By: Monte Fantasia M.D.   On: 11/04/2018 05:31    ED COURSE and MDM  Nursing notes and initial vitals signs, including pulse oximetry, reviewed.  Vitals:   11/04/18 0310  BP: (!) 165/68  Pulse: 66  Resp: 14  Temp: 98 F (36.7 C)  TempSrc: Oral  SpO2: 100%   5:41 AM The cause of the patient's pain is unclear but there is no evidence of acute coronary syndrome, pulmonary embolism, infection, malignancy, fracture or aortic dissection.  Early shingles is a possibility but there is no rash or hyperesthesia.  Muscle spasm is unlikely because she is not tender to palpation or with movement.  She is able to tolerate tramadol and we will prescribe this for her pain.  PROCEDURES    ED DIAGNOSES     ICD-10-CM   1. Acute right-sided thoracic back pain  M54.6        Jhonathan Desroches, Jenny Reichmann, MD 11/04/18 0544    Shanon Rosser, MD 11/04/18 [phone]

## 2018-11-04 NOTE — ED Notes (Signed)
Lab called, reported that d-dimer was clotted.

## 2018-11-04 NOTE — ED Notes (Signed)
ED Provider at bedside. 

## 2019-03-10 ENCOUNTER — Other Ambulatory Visit: Payer: Self-pay

## 2019-03-10 ENCOUNTER — Encounter (HOSPITAL_BASED_OUTPATIENT_CLINIC_OR_DEPARTMENT_OTHER): Payer: Self-pay | Admitting: *Deleted

## 2019-03-10 ENCOUNTER — Emergency Department (HOSPITAL_BASED_OUTPATIENT_CLINIC_OR_DEPARTMENT_OTHER)
Admission: EM | Admit: 2019-03-10 | Discharge: 2019-03-11 | Disposition: A | Discharge status: Home / Self Care | Payer: Medicare Other | Attending: Emergency Medicine | Admitting: Emergency Medicine

## 2019-03-10 ENCOUNTER — Emergency Department (HOSPITAL_BASED_OUTPATIENT_CLINIC_OR_DEPARTMENT_OTHER): Payer: Medicare Other

## 2019-03-10 DIAGNOSIS — I10 Essential (primary) hypertension: Secondary | ICD-10-CM | POA: Insufficient documentation

## 2019-03-10 DIAGNOSIS — Y92018 Other place in single-family (private) house as the place of occurrence of the external cause: Secondary | ICD-10-CM | POA: Diagnosis not present

## 2019-03-10 DIAGNOSIS — Z853 Personal history of malignant neoplasm of breast: Secondary | ICD-10-CM | POA: Diagnosis not present

## 2019-03-10 DIAGNOSIS — Z7982 Long term (current) use of aspirin: Secondary | ICD-10-CM | POA: Diagnosis not present

## 2019-03-10 DIAGNOSIS — S161XXA Strain of muscle, fascia and tendon at neck level, initial encounter: Secondary | ICD-10-CM

## 2019-03-10 DIAGNOSIS — Z79899 Other long term (current) drug therapy: Secondary | ICD-10-CM | POA: Diagnosis not present

## 2019-03-10 DIAGNOSIS — Z96653 Presence of artificial knee joint, bilateral: Secondary | ICD-10-CM | POA: Diagnosis not present

## 2019-03-10 DIAGNOSIS — Y999 Unspecified external cause status: Secondary | ICD-10-CM | POA: Diagnosis not present

## 2019-03-10 DIAGNOSIS — Y9389 Activity, other specified: Secondary | ICD-10-CM | POA: Insufficient documentation

## 2019-03-10 DIAGNOSIS — S01511A Laceration without foreign body of lip, initial encounter: Secondary | ICD-10-CM | POA: Insufficient documentation

## 2019-03-10 DIAGNOSIS — E039 Hypothyroidism, unspecified: Secondary | ICD-10-CM | POA: Diagnosis not present

## 2019-03-10 DIAGNOSIS — W01198A Fall on same level from slipping, tripping and stumbling with subsequent striking against other object, initial encounter: Secondary | ICD-10-CM | POA: Diagnosis not present

## 2019-03-10 DIAGNOSIS — S0990XA Unspecified injury of head, initial encounter: Secondary | ICD-10-CM

## 2019-03-10 DIAGNOSIS — Z8541 Personal history of malignant neoplasm of cervix uteri: Secondary | ICD-10-CM | POA: Diagnosis not present

## 2019-03-10 DIAGNOSIS — S01512A Laceration without foreign body of oral cavity, initial encounter: Secondary | ICD-10-CM | POA: Insufficient documentation

## 2019-03-10 DIAGNOSIS — W19XXXA Unspecified fall, initial encounter: Secondary | ICD-10-CM

## 2019-03-10 MED ORDER — LIDOCAINE-EPINEPHRINE 1 %-1:100000 IJ SOLN
INTRAMUSCULAR | Status: AC
  Filled 2019-03-10: qty 1

## 2019-03-10 MED ORDER — LIDOCAINE-EPINEPHRINE (PF) 1 %-1:200000 IJ SOLN
INTRAMUSCULAR | Status: AC
  Filled 2019-03-10: qty 10

## 2019-03-10 MED ORDER — LIDOCAINE-EPINEPHRINE (PF) 2 %-1:200000 IJ SOLN
INTRAMUSCULAR | Status: AC
  Filled 2019-03-10: qty 10

## 2019-03-10 MED ORDER — LIDOCAINE-EPINEPHRINE 2 %-1:100000 IJ SOLN
5.0000 mL | Freq: Once | INTRAMUSCULAR | Status: DC
  Filled 2019-03-10: qty 5.1

## 2019-03-10 NOTE — ED Triage Notes (Signed)
Pt fell today approx 1730, broke bottom two teeth and has small lac on inside of medial bottom lip. Bleeding controlled.

## 2019-03-11 MED ORDER — LIDOCAINE 5 % EX PTCH: 1.0000 | MEDICATED_PATCH | CUTANEOUS | 0 refills | Status: AC

## 2019-03-11 MED ORDER — CYCLOBENZAPRINE HCL 10 MG PO TABS: 10.0000 mg | ORAL_TABLET | Freq: Two times a day (BID) | ORAL | 0 refills | Status: AC | PRN

## 2019-03-11 NOTE — ED Provider Notes (Signed)
[facility] EMERGENCY DEPARTMENT Provider Note   CSN: [id number] Arrival date & time: 03/10/19  2008     History Chief Complaint  Patient presents with  . Fall  . Facial Injury    [name] is a 85 y.o. female.  HPI      85 year old female presents with concern for mechanical fall with lip laceration and headache.  Reports that she was adding more Christmas decorations outside of her house, when she tripped over a [place], and hit the right side of her face and head on the steps.  This occurred at approximately 5:30 PM.  She had no loss of consciousness, was able to get up and walk into the house.  Initially did not have neck pain, but reports it is worsening now.  The neck pain is located on the right side.  Reports headache is about a 5 out of 10.  Denies nausea, vomiting, numbness, weakness, change in vision.  Denies injuries to the arms or legs, although reports she does have a history of ligamentous injury to the left shoulder, and this appears to be slightly worsened.  He is otherwise been in normal state of health.  She is not on anticoagulation.  Has partial dentures which were broken in the fall. Had TDaP about 3-4 years ago.  Past Medical History:  Diagnosis Date  . Anxiety   . Arthritis    "joints" (03/16/2013)  . Breast cancer ([place])   . Bronchitis   . Chronic bronchitis ([place])    "yearly for the last 3-4 years" (03/16/2013)  . Complication of anesthesia   . Diverticulosis   . GERD (gastroesophageal reflux disease)   . Hiatal hernia   . High cholesterol   . Hypertension   . Hypothyroidism   . PONV (postoperative nausea and vomiting)   . S/P knee surgery    right knee surgery on 12-18  . Thyroid disease   . Uterine cancer [facility])     Patient Active Problem List   Diagnosis Date Noted  . Chronic left shoulder pain 06/01/2018  . Pain in limb-Left calf 05/19/2013  . HTN (hypertension) 03/17/2013  . Iron deficiency anemia 03/17/2013  . HTN  (hypertension), malignant 03/17/2013  . Urinary retention 03/17/2013  . Infection of total joint prosthesis ([place]) 03/16/2013  . Infection of total knee replacement ([place]) 03/15/2013  . Injury of popliteal artery 09/23/2012  . Aftercare following surgery of the circulatory system, [place] 09/23/2012  . Peripheral vascular disease, unspecified ([place]) 09/23/2012  . Muscle soreness-Right popliteal 09/23/2012  . Septic arthritis ([place]) 05/18/2012  . Coagulase-negative staphylococcal infection 05/18/2012    Past Surgical History:  Procedure Laterality Date  . ABDOMINAL HYSTERECTOMY     "left an ovary" (03/16/2013)  . APPENDECTOMY    . BREAST BIOPSY Left   . EYE SURGERY    . GLAUCOMA SURGERY Bilateral 03/01/2013-03/08/2013  . I&D EXTREMITY Right 03/15/2013   Procedure: Exploration right total knee replacement , removal of components, and insertion of antibiotic spacer.;  Surgeon: Garald Balding, MD;  Location: [place];  Service: Orthopedics;  Laterality: Right;  . I&D KNEE WITH POLY EXCHANGE  03/08/2012   Procedure: IRRIGATION AND DEBRIDEMENT KNEE WITH POLY EXCHANGE;  Surgeon: Garald Balding, MD;  Location: [place];  Service: Orthopedics;  Laterality: Right;  Right Total Knee Irrigation & Debridement, Synovectomy, Poly Exchange, Placement of Antibiotic Beads  . JOINT REPLACEMENT Right Dec. 10, 2013   Knee  . JOINT REPLACEMENT Right Dec. 17, 2014  Knee I & D  . MASTECTOMY Left ~ 2005  . PATCH ANGIOPLASTY Right 01/16/10   popliteal artery repair  . TOTAL KNEE  PROSTHESIS REMOVAL W/ SPACER INSERTION Right 03/15/2013  . TOTAL KNEE ARTHROPLASTY Left 2007  . TOTAL KNEE ARTHROPLASTY Right 01/17/2011     OB History   No obstetric history on file.     Family History  Problem Relation Age of Onset  . Diabetes Mother   . Heart disease Father   . Stroke Father   . Hypertension Father   . Heart attack Father   . Cancer Sister   . Diabetes Sister   . Hyperlipidemia Sister   . Hypertension  Sister   . Cancer Brother   . Diabetes Brother   . Hypertension Brother   . Hyperlipidemia Daughter     Social History   Tobacco Use  . Smoking status: Never Smoker  . Smokeless tobacco: Never Used  Substance Use Topics  . Alcohol use: No    Alcohol/week: 0.0 standard drinks  . Drug use: No    Home Medications Prior to Admission medications   Medication Sig Start Date End Date Taking? Authorizing Provider  amLODipine (NORVASC) 5 MG tablet Take 5 mg by mouth daily.    [provider]  aspirin 81 MG tablet Take 81 mg by mouth daily.    [provider]  cyclobenzaprine (FLEXERIL) 10 MG tablet Take 1 tablet (10 mg total) by mouth 2 (two) times daily as needed for muscle spasms. 03/11/19   Gareth Morgan, MD  docusate sodium (COLACE) 100 MG capsule Take 100 mg by mouth 2 (two) times daily.    [provider]  ergocalciferol (VITAMIN D2) 50000 UNITS capsule Take 50,000 Units by mouth every 30 (thirty) days. Take on the 1st day of each month.    [provider]  indapamide (LOZOL) 1.25 MG tablet Take 1.25 mg by mouth every other day. Reported on 06/14/2015    [provider]  levothyroxine (SYNTHROID, LEVOTHROID) 50 MCG tablet Take 50 mcg by mouth daily.    [provider]  lidocaine (LIDODERM) 5 % Place 1 patch onto the skin daily. Remove & Discard patch within 12 hours or as directed by MD 03/11/19   Gareth Morgan, MD  LORazepam (ATIVAN) 0.5 MG tablet Take 0.5 mg by mouth every 8 (eight) hours as needed for anxiety. Reported on 06/14/2015 05/24/13   [provider]  Magnesium Hydroxide (MILK OF MAGNESIA PO) Take 30 mLs by mouth daily as needed (constipation).    [provider]  Oxycodone HCl 10 MG TABS Take 10 mg by mouth daily as needed (moderate to severe pain).     [provider]  pravastatin (PRAVACHOL) 40 MG tablet Take 40 mg by mouth daily.    [provider]  traMADol (ULTRAM) 50 MG tablet  Take 1 tablet (50 mg total) by mouth every 6 (six) hours as needed (for pain). 11/04/18   Molpus, Jenny Reichmann, MD    Allergies    Naproxen, Nsaids, and Hydrocodone-acetaminophen  Review of Systems   Review of Systems  Constitutional: Negative for fever.  Eyes: Negative for visual disturbance.  Respiratory: Negative for cough and shortness of breath.   Cardiovascular: Negative for chest pain.  Gastrointestinal: Negative for abdominal pain, nausea and vomiting.  Genitourinary: Negative for difficulty urinating.  Musculoskeletal: Positive for neck pain. Negative for arthralgias, back pain and gait problem.  Skin: Positive for wound. Negative for rash.  Neurological: Positive for  headaches. Negative for syncope and light-headedness.    Physical Exam Updated Vital Signs BP (!) 158/100 (BP Location: Right Arm)   Pulse 77   Temp 98.1 F (36.7 C) (Oral)   Resp 20   Ht 5\' 4"  (1.626 m)   Wt 72.6 kg   SpO2 97%   BMI 27.46 kg/m   Physical Exam Vitals and nursing note reviewed.  Constitutional:      General: She is not in acute distress.    Appearance: She is well-developed. She is not diaphoretic.  HENT:     Head: Normocephalic.     Comments: Through and through laceration 1.5cm interior lip, 1cm exterior lip Eyes:     Conjunctiva/sclera: Conjunctivae normal.  Neck:     Comments: No midline tenderness, right sided cervical tenderness Cardiovascular:     Rate and Rhythm: Normal rate and regular rhythm.  Pulmonary:     Effort: Pulmonary effort is normal. No respiratory distress.  Abdominal:     General: There is no distension.     Palpations: Abdomen is soft.     Tenderness: There is no abdominal tenderness. There is no guarding.  Musculoskeletal:        General: Tenderness (right shoulder , reports history of ligamentous problem of shoulder, no deformity pain with ROM but good ROM) present.     Cervical back: Normal range of motion.  Skin:    General: Skin is warm and dry.      Findings: No erythema or rash.  Neurological:     Mental Status: She is alert and oriented to person, place, and time.     GCS: GCS eye subscore is 4. GCS verbal subscore is 5. GCS motor subscore is 6.     Cranial Nerves: Cranial nerves are intact.     Sensory: Sensation is intact. No sensory deficit.     Motor: Motor function is intact. No weakness.     Comments: Pain with movements of right shoulder      ED Results / Procedures / Treatments   Labs (all labs ordered are listed, but only abnormal results are displayed) Labs Reviewed - No data to display  EKG None  Radiology No results found.  Procedures .Marland KitchenLaceration Repair  Date/Time: 03/11/2019 1:12 AM Performed by: Gareth Morgan, MD Authorized by: Gareth Morgan, MD   Consent:    Consent obtained:  Verbal   Consent given by:  Patient   Risks discussed:  Infection and pain   Alternatives discussed:  No treatment Anesthesia (see MAR for exact dosages):    Anesthesia method:  Local infiltration   Local anesthetic:  Lidocaine 2% WITH epi Laceration details:    Location:  Lip   Lip location:  Lower exterior lip and lower interior lip   Length (cm):  1.5   Depth (mm):  1 Repair type:    Repair type:  Intermediate Treatment:    Area cleansed with:  Saline   Amount of cleaning:  Standard   Irrigation solution:  Sterile saline   Irrigation volume:  300 Subcutaneous repair:    Suture size:  5-0   Suture material:  Vicryl   Suture technique:  Simple interrupted   Number of sutures:  1 Mucous membrane repair:    Suture size:  5-0   Suture material:  Vicryl   Suture technique:  Simple interrupted   Number of sutures:  3 Skin repair:    Repair method:  Sutures   Suture size:  5-0   Suture  technique:  Simple interrupted   Number of sutures:  3 Approximation:    [place] border: well-aligned   Post-procedure details:    Dressing:  Open (no dressing)   Patient tolerance of procedure:  Tolerated well, no  immediate complications Comments:     Through and through lip laceration, deep suture placed with repair of interior and exterior portions, removed fractured tooth foreign body, thorough irrigation   (including critical care time)  Medications Ordered in ED Medications  lidocaine-EPINEPHrine (XYLOCAINE W/EPI) 2 %-1:100000 (with pres) injection 5 mL (has no administration in time range)  lidocaine-EPINEPHrine (XYLOCAINE W/EPI) 2 %-1:200000 (PF) injection (has no administration in time range)    ED Course  I have reviewed the triage vital signs and the nursing notes.  Pertinent labs & imaging results that were available during my care of the patient were reviewed by me and considered in my medical decision making (see chart for details).    MDM Rules/Calculators/A&P       85 year old female presents with concern for mechanical fall with lip laceration and headache.  Initially ordered CT head and cervical spine given patient's age with head trauma and headache, inability to use head CT rules for clinical rule out.  However, CT scanner is currently malfunctioning.  Discussed possibility of transfer to another facility for CT, however discussed that given presentation is not unreasonable to continue to closely monitor her.  Her fall prior to time of discharge was approximately 6 hours earlier, with no significant worsening headache, no change in mental status, no nausea, no vomiting, no neurologic symptoms, and she is not on anticoagulation.  Overall, have low suspicion for intracranial bleed, but did discuss if she has persistent headache she should return to the emergency department for CT scanning.  Also discussed that I have a low suspicion that her neck pain represents a cervical fracture given that she has no midline tenderness, no neurologic deficits, and right-sided neck pain with spasm, feels more likely a cervical sprain, however again if she has continued or worsening pain, she may seek  care for reevaluation and imaging.  She would like to go home rather than be transferred for emergent CT imaging.   Through and through lip laceration irrigated and closed. Tooth removed from lip, no sign of remaining foreign body.  Recommend dental follow up and PCP follow up. Patient discharged in stable condition with understanding of reasons to return.     Final Clinical Impression(s) / ED Diagnoses Final diagnoses:  Fall, initial encounter  Lip laceration, initial encounter  Traumatic injury of head, initial encounter  Strain of neck muscle, initial encounter    Rx / DC Orders ED Discharge Orders         Ordered    cyclobenzaprine (FLEXERIL) 10 MG tablet  2 times daily PRN     03/11/19 0000    lidocaine (LIDODERM) 5 %  Every 24 hours     03/11/19 0000           Gareth Morgan, MD 03/11/19 [phone]

## 2019-04-27 ENCOUNTER — Encounter: Payer: Self-pay | Admitting: Orthopaedic Surgery

## 2019-04-27 ENCOUNTER — Ambulatory Visit (INDEPENDENT_AMBULATORY_CARE_PROVIDER_SITE_OTHER): Payer: Medicare Other

## 2019-04-27 ENCOUNTER — Ambulatory Visit (INDEPENDENT_AMBULATORY_CARE_PROVIDER_SITE_OTHER): Payer: Medicare Other | Admitting: Orthopaedic Surgery

## 2019-04-27 ENCOUNTER — Other Ambulatory Visit: Payer: Self-pay

## 2019-04-27 VITALS — Ht 64.0 in | Wt 165.0 lb

## 2019-04-27 DIAGNOSIS — M25552 Pain in left hip: Secondary | ICD-10-CM

## 2019-04-27 DIAGNOSIS — M12812 Other specific arthropathies, not elsewhere classified, left shoulder: Secondary | ICD-10-CM

## 2019-04-27 MED ORDER — METHYLPREDNISOLONE ACETATE 40 MG/ML IJ SUSP
80.0000 mg | INTRAMUSCULAR | Status: AC | PRN
  Administered 2019-04-27: 17:00:00 80 mg via INTRA_ARTICULAR

## 2019-04-27 MED ORDER — LIDOCAINE HCL 1 % IJ SOLN
2.0000 mL | INTRAMUSCULAR | Status: AC | PRN
  Administered 2019-04-27: 17:00:00 2 mL

## 2019-04-27 MED ORDER — BUPIVACAINE HCL 0.5 % IJ SOLN
2.0000 mL | INTRAMUSCULAR | Status: AC | PRN
  Administered 2019-04-27: 17:00:00 2 mL via INTRA_ARTICULAR

## 2019-04-27 NOTE — Progress Notes (Signed)
Office Visit Note   Patient: [name]           Date of Birth: 11-16-1925           MRN: [id number] Visit Date: 04/27/2019              Requested by: Renaldo Reel, DO [place],  [place] [address] PCP: Renaldo Reel, DO   Assessment & Plan: Visit Diagnoses:  1. Pain in left hip   2. Rotator cuff arthropathy of left shoulder     Plan: Left shoulder pain is related to the rotator cuff arthropathy per prior films.  Injected the glenohumeral joint with cortisone with immediate relief of much of her pain with improved range of motion.  We will plan to see her back as needed.  Has recently developed some pain along the lateral aspect of her left hip.  This could be referred from her back as she had some mild degenerative changes or could be just a bursitis.  No evidence of arthritis of her left hip.  We will plan to see her back in a week or 2 if it persists and consider cortisone injection over the trochanter region.  Can try Voltaren gel in the interim  Follow-Up Instructions: Return if symptoms worsen or fail to improve.   Orders:  Orders Placed This Encounter  Procedures  . Large Joint Inj: L glenohumeral  . XR Lumbar Spine 2-3 Views  . XR Pelvis 1-2 Views   No orders of the defined types were placed in this encounter.     Procedures: Large Joint Inj: L glenohumeral on 04/27/2019 4:43 PM Details: 25 G 1.5 in needle, anteromedial approach  Arthrogram: No  Medications: 2 mL lidocaine 1 %; 2 mL bupivacaine 0.5 %; 80 mg methylPREDNISolone acetate 40 MG/ML Procedure, treatment alternatives, risks and benefits explained, specific risks discussed. Consent was given by the patient. Immediately prior to procedure a time out was called to verify the correct patient, procedure, equipment, support staff and site/side marked as required. Patient was prepped and draped in the usual sterile fashion.       Clinical Data: No additional  findings.   Subjective: Chief Complaint  Patient presents with  . Left Hip - Pain  . Left Shoulder - Pain  Patient presents today for left hip pain. She said that the pain is located in her buttock. No pain radiates down her leg or into her groin. She said that the pain started last Friday. She said that it only hurts with walking. Patient states that she takes Tramadol for another problem.  Patient also presents today for recurrent left shoulder pain. She was last evaluated here for her shoulder in March of last year and received a cortisone injection. She also received another injection in her left shoulder with Dr.Brumfield in June of last year.   HPI  Review of Systems   Objective: Vital Signs: Ht 5\' 4"  (1.626 m)   Wt 165 lb (74.8 kg)   BMI 28.32 kg/m   Physical Exam Constitutional:      Appearance: She is well-developed.  Eyes:     Pupils: Pupils are equal, round, and reactive to light.  Pulmonary:     Effort: Pulmonary effort is normal.  Skin:    General: Skin is warm and dry.  Neurological:     Mental Status: She is alert and oriented to person, place, and time.  Psychiatric:  Behavior: Behavior normal.     Ortho Exam limited range of motion left shoulder actively with only about 70 degrees of abduction about 90 degrees of flexion.  Positive impingement and empty can testing.  Biceps and distal position.  Skin intact.  Some tenderness over the glenohumeral joint with some limitation of external rotation.  Considerable weakness with internal and external rotation.  Good grip and release.  Straight leg raise negative bilaterally.  No pain with range of motion of either hip.  Some local tenderness directly over the tip of the greater trochanter left hip.  No percussible back or flank pain  Specialty Comments:  No specialty comments available.  Imaging: XR Lumbar Spine 2-3 Views  Result Date: 04/27/2019 AP lateral lumbar spine reveal slight anterior listhesis  of L4 on 5.  There are areas of degenerative disc disease with mild narrowing and some facet sclerosis at L4-5 and L5-S1.  Very minimal degenerative scoliosis of 5 degrees or less.  Diffuse calcification of the abdominal aorta but without obvious aneurysmal dilatation.  XR Pelvis 1-2 Views  Result Date: 04/27/2019 AP the pelvis did not demonstrate any acute changes.  There was a number of phleboliths and possibly calcified lymph nodes on the right side.  No significant degenerative change in either hip.    PMFS History: Patient Active Problem List   Diagnosis Date Noted  . Rotator cuff arthropathy of left shoulder 04/27/2019  . Chronic left shoulder pain 06/01/2018  . Pain in limb-Left calf 05/19/2013  . HTN (hypertension) 03/17/2013  . Iron deficiency anemia 03/17/2013  . HTN (hypertension), malignant 03/17/2013  . Urinary retention 03/17/2013  . Infection of total joint prosthesis ([facility]) 03/16/2013  . Infection of total knee replacement ([facility]) 03/15/2013  . Injury of popliteal artery 09/23/2012  . Aftercare following surgery of the circulatory system, [place] 09/23/2012  . Peripheral vascular disease, unspecified ([facility]) 09/23/2012  . Muscle soreness-Right popliteal 09/23/2012  . Septic arthritis ([facility] [facility]) 05/18/2012  . Coagulase-negative staphylococcal infection 05/18/2012   Past Medical History:  Diagnosis Date  . Anxiety   . Arthritis    "joints" (03/16/2013)  . Breast cancer ([facility])   . Bronchitis   . Chronic bronchitis ([facility])    "yearly for the last 3-4 years" (03/16/2013)  . Complication of anesthesia   . Diverticulosis   . GERD (gastroesophageal reflux disease)   . Hiatal hernia   . High cholesterol   . Hypertension   . Hypothyroidism   . PONV (postoperative nausea and vomiting)   . S/P knee surgery    right knee surgery on 12-18  . Thyroid disease   . Uterine cancer ([facility])     Family History  Problem Relation Age of Onset  . Diabetes Mother   . Heart disease Father   .  Stroke Father   . Hypertension Father   . Heart attack Father   . Cancer Sister   . Diabetes Sister   . Hyperlipidemia Sister   . Hypertension Sister   . Cancer Brother   . Diabetes Brother   . Hypertension Brother   . Hyperlipidemia Daughter     Past Surgical History:  Procedure Laterality Date  . ABDOMINAL HYSTERECTOMY     "left an ovary" (03/16/2013)  . APPENDECTOMY    . BREAST BIOPSY Left   . EYE SURGERY    . GLAUCOMA SURGERY Bilateral 03/01/2013-03/08/2013  . I & D EXTREMITY Right 03/15/2013   Procedure: Exploration right total knee replacement , removal of components, and insertion  of antibiotic spacer.;  Surgeon: Garald Balding, MD;  Location: [facility];  Service: Orthopedics;  Laterality: Right;  . I & D KNEE WITH POLY EXCHANGE  03/08/2012   Procedure: IRRIGATION AND DEBRIDEMENT KNEE WITH POLY EXCHANGE;  Surgeon: Garald Balding, MD;  Location: [facility];  Service: Orthopedics;  Laterality: Right;  Right Total Knee Irrigation & Debridement, Synovectomy, Poly Exchange, Placement of Antibiotic Beads  . JOINT REPLACEMENT Right Dec. 10, 2013   Knee  . JOINT REPLACEMENT Right Dec. 17, 2014   Knee I & D  . MASTECTOMY Left ~ 2005  . PATCH ANGIOPLASTY Right 01/16/10   popliteal artery repair  . TOTAL KNEE  PROSTHESIS REMOVAL W/ SPACER INSERTION Right 03/15/2013  . TOTAL KNEE ARTHROPLASTY Left 2007  . TOTAL KNEE ARTHROPLASTY Right 01/17/2011   Social History   Occupational History  . Not on file  Tobacco Use  . Smoking status: Never Smoker  . Smokeless tobacco: Never Used  Substance and Sexual Activity  . Alcohol use: No    Alcohol/week: 0.0 standard drinks  . Drug use: No  . Sexual activity: Not Currently

## 2019-05-16 ENCOUNTER — Encounter: Payer: Self-pay | Admitting: Orthopaedic Surgery

## 2019-05-16 ENCOUNTER — Other Ambulatory Visit: Payer: Self-pay

## 2019-05-16 ENCOUNTER — Ambulatory Visit (INDEPENDENT_AMBULATORY_CARE_PROVIDER_SITE_OTHER): Payer: Medicare Other | Admitting: Orthopaedic Surgery

## 2019-05-16 DIAGNOSIS — M7062 Trochanteric bursitis, left hip: Secondary | ICD-10-CM | POA: Diagnosis not present

## 2019-05-16 MED ORDER — BUPIVACAINE HCL 0.5 % IJ SOLN
2.0000 mL | INTRAMUSCULAR | Status: AC | PRN
  Administered 2019-05-16: 2 mL via INTRA_ARTICULAR

## 2019-05-16 MED ORDER — LIDOCAINE HCL 1 % IJ SOLN
2.0000 mL | INTRAMUSCULAR | Status: AC | PRN
  Administered 2019-05-16: 2 mL

## 2019-05-16 MED ORDER — METHYLPREDNISOLONE ACETATE 40 MG/ML IJ SUSP
80.0000 mg | INTRAMUSCULAR | Status: AC | PRN
  Administered 2019-05-16: 80 mg via INTRA_ARTICULAR

## 2019-05-16 NOTE — Progress Notes (Signed)
Office Visit Note   Patient: [name]           Date of Birth: 21-Mar-1926           MRN: [id number] Visit Date: 05/16/2019              Requested by: Renaldo Reel, DO [address] [place],  [place] [address] PCP: Renaldo Reel, DO   Assessment & Plan: Visit Diagnoses:  1. Trochanteric bursitis, left hip     Plan: Symptoms are consistent with greater trochanteric bursitis.  I have injected the area of point tenderness directly over the greater trochanter.  Prior films did not demonstrate any significant arthritis.  We will plan to see back as needed  Follow-Up Instructions: Return if symptoms worsen or fail to improve.   Orders:  Orders Placed This Encounter  Procedures  . Large Joint Inj: L greater trochanter   No orders of the defined types were placed in this encounter.     Procedures: Large Joint Inj: L greater trochanter on 05/16/2019 4:06 PM Indications: pain and diagnostic evaluation Details: 25 G 1.5 in needle, lateral approach  Arthrogram: No  Medications: 2 mL lidocaine 1 %; 2 mL bupivacaine 0.5 %; 80 mg methylPREDNISolone acetate 40 MG/ML Procedure, treatment alternatives, risks and benefits explained, specific risks discussed. Consent was given by the patient. Immediately prior to procedure a time out was called to verify the correct patient, procedure, equipment, support staff and site/side marked as required. Patient was prepped and draped in the usual sterile fashion.       Clinical Data: No additional findings.   Subjective: Chief Complaint  Patient presents with  . Left Shoulder - Follow-up  . Left Hip - Pain  Patient presents today for a two week follow up on her left shoulder. She had her left shoulder injected with cortisone on 04/27/2019. She said that the injected did help some. She returns today and wants her left hip greater trochanter injected with cortisone. She takes tramadol as needed for pain.  Prior hip films  did not demonstrate any significant degenerative changes.  HPI  Review of Systems   Objective: Vital Signs: Ht 5\' 4"  (1.626 m)   Wt 165 lb (74.8 kg)   BMI 28.32 kg/m   Physical Exam Constitutional:      Appearance: She is well-developed.  Eyes:     Pupils: Pupils are equal, round, and reactive to light.  Pulmonary:     Effort: Pulmonary effort is normal.  Skin:    General: Skin is warm and dry.  Neurological:     Mental Status: She is alert and oriented to person, place, and time.  Psychiatric:        Behavior: Behavior normal.     Ortho Exam awake alert and oriented x3.  Comfortable sitting.  Examination was localized to her left hip where she had local tenderness over the greater trochanter.  No pain with internal and external rotation.  Skin intact  Specialty Comments:  No specialty comments available.  Imaging: No results found.   PMFS History: Patient Active Problem List   Diagnosis Date Noted  . Trochanteric bursitis, left hip 05/16/2019  . Rotator cuff arthropathy of left shoulder 04/27/2019  . Chronic left shoulder pain 06/01/2018  . Pain in limb-Left calf 05/19/2013  . HTN (hypertension) 03/17/2013  . Iron deficiency anemia 03/17/2013  . HTN (hypertension), malignant 03/17/2013  . Urinary retention 03/17/2013  . Infection of total joint  prosthesis ([facility]) 03/16/2013  . Infection of total knee replacement ([facility]) 03/15/2013  . Injury of popliteal artery 09/23/2012  . Aftercare following surgery of the circulatory system, [place] 09/23/2012  . Peripheral vascular disease, unspecified ([facility]) 09/23/2012  . Muscle soreness-Right popliteal 09/23/2012  . Septic arthritis ([facility]) 05/18/2012  . Coagulase-negative staphylococcal infection 05/18/2012   Past Medical History:  Diagnosis Date  . Anxiety   . Arthritis    "joints" (03/16/2013)  . Breast cancer ([facility])   . Bronchitis   . Chronic bronchitis ([facility])    "yearly for the last 3-4 years" (03/16/2013)  .  Complication of anesthesia   . Diverticulosis   . GERD (gastroesophageal reflux disease)   . Hiatal hernia   . High cholesterol   . Hypertension   . Hypothyroidism   . PONV (postoperative nausea and vomiting)   . S/P knee surgery    right knee surgery on 12-18  . Thyroid disease   . Uterine cancer ([facility])     Family History  Problem Relation Age of Onset  . Diabetes Mother   . Heart disease Father   . Stroke Father   . Hypertension Father   . Heart attack Father   . Cancer Sister   . Diabetes Sister   . Hyperlipidemia Sister   . Hypertension Sister   . Cancer Brother   . Diabetes Brother   . Hypertension Brother   . Hyperlipidemia Daughter     Past Surgical History:  Procedure Laterality Date  . ABDOMINAL HYSTERECTOMY     "left an ovary" (03/16/2013)  . APPENDECTOMY    . BREAST BIOPSY Left   . EYE SURGERY    . GLAUCOMA SURGERY Bilateral 03/01/2013-03/08/2013  . I & D EXTREMITY Right 03/15/2013   Procedure: Exploration right total knee replacement , removal of components, and insertion of antibiotic spacer.;  Surgeon: Garald Balding, MD;  Location: [facility];  Service: Orthopedics;  Laterality: Right;  . I & D KNEE WITH POLY EXCHANGE  03/08/2012   Procedure: IRRIGATION AND DEBRIDEMENT KNEE WITH POLY EXCHANGE;  Surgeon: Garald Balding, MD;  Location: [facility];  Service: Orthopedics;  Laterality: Right;  Right Total Knee Irrigation & Debridement, Synovectomy, Poly Exchange, Placement of Antibiotic Beads  . JOINT REPLACEMENT Right Dec. 10, 2013   Knee  . JOINT REPLACEMENT Right Dec. 17, 2014   Knee I & D  . MASTECTOMY Left ~ 2005  . PATCH ANGIOPLASTY Right 01/16/10   popliteal artery repair  . TOTAL KNEE  PROSTHESIS REMOVAL W/ SPACER INSERTION Right 03/15/2013  . TOTAL KNEE ARTHROPLASTY Left 2007  . TOTAL KNEE ARTHROPLASTY Right 01/17/2011   Social History   Occupational History  . Not on file  Tobacco Use  . Smoking status: Never Smoker  . Smokeless tobacco: Never  Used  Substance and Sexual Activity  . Alcohol use: No    Alcohol/week: 0.0 standard drinks  . Drug use: No  . Sexual activity: Not Currently

## 2019-08-22 ENCOUNTER — Telehealth: Payer: Self-pay | Admitting: Orthopaedic Surgery

## 2019-08-22 NOTE — Telephone Encounter (Signed)
Patient called. She would like Lauren to call her. Says she has some questions. Her call back number is [phone]

## 2019-08-22 NOTE — Telephone Encounter (Signed)
Spoke with patient. She had some questions about when she was seen last and if x-rays were done. I explained that we saw her for her left hip and she received a trochanteric injection, but did not get any x-rays at that visit.

## 2019-09-12 ENCOUNTER — Other Ambulatory Visit: Payer: Self-pay | Admitting: *Deleted

## 2019-09-12 DIAGNOSIS — I739 Peripheral vascular disease, unspecified: Secondary | ICD-10-CM

## 2019-09-12 DIAGNOSIS — S85001D Unspecified injury of popliteal artery, right leg, subsequent encounter: Secondary | ICD-10-CM

## 2019-09-18 ENCOUNTER — Ambulatory Visit (HOSPITAL_COMMUNITY): Payer: Medicare Other | Attending: Surgery

## 2019-09-18 ENCOUNTER — Ambulatory Visit: Payer: Medicare Other

## 2019-09-18 ENCOUNTER — Ambulatory Visit (HOSPITAL_COMMUNITY): Admission: RE | Admit: 2019-09-18 | Payer: Medicare Other | Source: Ambulatory Visit

## 2019-09-21 ENCOUNTER — Telehealth: Payer: Self-pay

## 2019-09-21 NOTE — Telephone Encounter (Signed)
Pt called to move up her Aug. Appt. We had to split her appt with labs one day and seeing APP at a later date. Pt is okay with this. She is aware to call us back sooner if anything changes/worsens. Pt denies pain at this time.

## 2019-10-20 ENCOUNTER — Ambulatory Visit (HOSPITAL_COMMUNITY)
Admission: RE | Admit: 2019-10-20 | Discharge: 2019-10-20 | Disposition: A | Discharge status: Home / Self Care | Payer: Medicare Other | Source: Ambulatory Visit | Attending: Vascular Surgery | Admitting: Vascular Surgery

## 2019-10-20 ENCOUNTER — Ambulatory Visit (INDEPENDENT_AMBULATORY_CARE_PROVIDER_SITE_OTHER)
Admission: RE | Admit: 2019-10-20 | Discharge: 2019-10-20 | Disposition: A | Discharge status: Home / Self Care | Payer: Medicare Other | Source: Ambulatory Visit | Attending: Vascular Surgery | Admitting: Vascular Surgery

## 2019-10-20 ENCOUNTER — Other Ambulatory Visit: Payer: Self-pay

## 2019-10-20 DIAGNOSIS — S85001D Unspecified injury of popliteal artery, right leg, subsequent encounter: Secondary | ICD-10-CM | POA: Diagnosis not present

## 2019-10-20 DIAGNOSIS — I739 Peripheral vascular disease, unspecified: Secondary | ICD-10-CM

## 2019-10-23 ENCOUNTER — Ambulatory Visit: Payer: Medicare Other

## 2019-11-07 ENCOUNTER — Encounter (HOSPITAL_COMMUNITY): Payer: Medicare Other

## 2019-11-07 ENCOUNTER — Ambulatory Visit: Payer: Medicare Other

## 2020-01-13 ENCOUNTER — Emergency Department (HOSPITAL_BASED_OUTPATIENT_CLINIC_OR_DEPARTMENT_OTHER)
Admission: EM | Admit: 2020-01-13 | Discharge: 2020-01-14 | Disposition: A | Discharge status: Home / Self Care | Payer: Medicare Other | Attending: Emergency Medicine | Admitting: Emergency Medicine

## 2020-01-13 ENCOUNTER — Emergency Department (HOSPITAL_BASED_OUTPATIENT_CLINIC_OR_DEPARTMENT_OTHER): Payer: Medicare Other

## 2020-01-13 ENCOUNTER — Other Ambulatory Visit: Payer: Self-pay

## 2020-01-13 ENCOUNTER — Encounter (HOSPITAL_BASED_OUTPATIENT_CLINIC_OR_DEPARTMENT_OTHER): Payer: Self-pay | Admitting: Emergency Medicine

## 2020-01-13 DIAGNOSIS — S7001XA Contusion of right hip, initial encounter: Secondary | ICD-10-CM | POA: Diagnosis not present

## 2020-01-13 DIAGNOSIS — Z8542 Personal history of malignant neoplasm of other parts of uterus: Secondary | ICD-10-CM | POA: Diagnosis not present

## 2020-01-13 DIAGNOSIS — Z7982 Long term (current) use of aspirin: Secondary | ICD-10-CM | POA: Diagnosis not present

## 2020-01-13 DIAGNOSIS — Z79890 Hormone replacement therapy (postmenopausal): Secondary | ICD-10-CM | POA: Insufficient documentation

## 2020-01-13 DIAGNOSIS — Z96653 Presence of artificial knee joint, bilateral: Secondary | ICD-10-CM | POA: Insufficient documentation

## 2020-01-13 DIAGNOSIS — Z853 Personal history of malignant neoplasm of breast: Secondary | ICD-10-CM | POA: Insufficient documentation

## 2020-01-13 DIAGNOSIS — W010XXA Fall on same level from slipping, tripping and stumbling without subsequent striking against object, initial encounter: Secondary | ICD-10-CM | POA: Diagnosis not present

## 2020-01-13 DIAGNOSIS — Z79899 Other long term (current) drug therapy: Secondary | ICD-10-CM | POA: Insufficient documentation

## 2020-01-13 DIAGNOSIS — E039 Hypothyroidism, unspecified: Secondary | ICD-10-CM | POA: Insufficient documentation

## 2020-01-13 DIAGNOSIS — S79911A Unspecified injury of right hip, initial encounter: Secondary | ICD-10-CM | POA: Diagnosis present

## 2020-01-13 DIAGNOSIS — W19XXXA Unspecified fall, initial encounter: Secondary | ICD-10-CM

## 2020-01-13 DIAGNOSIS — Y92007 Garden or yard of unspecified non-institutional (private) residence as the place of occurrence of the external cause: Secondary | ICD-10-CM | POA: Insufficient documentation

## 2020-01-13 DIAGNOSIS — I10 Essential (primary) hypertension: Secondary | ICD-10-CM | POA: Insufficient documentation

## 2020-01-13 DIAGNOSIS — Y92009 Unspecified place in unspecified non-institutional (private) residence as the place of occurrence of the external cause: Secondary | ICD-10-CM

## 2020-01-13 NOTE — ED Triage Notes (Signed)
Reports she slipped and fell outside today.  Now having pain in right upper leg.  Able to walk afterwards but it is painful.

## 2020-01-14 ENCOUNTER — Emergency Department (HOSPITAL_BASED_OUTPATIENT_CLINIC_OR_DEPARTMENT_OTHER): Payer: Medicare Other

## 2020-01-14 MED ORDER — ACETAMINOPHEN 325 MG PO TABS
650.0000 mg | ORAL_TABLET | Freq: Once | ORAL | Status: AC
  Administered 2020-01-14: 650 mg via ORAL
  Filled 2020-01-14: qty 2

## 2020-01-14 MED ORDER — TRAMADOL HCL 50 MG PO TABS
50.0000 mg | ORAL_TABLET | Freq: Once | ORAL | Status: AC
  Administered 2020-01-14: 50 mg via ORAL
  Filled 2020-01-14: qty 1

## 2020-01-14 NOTE — Discharge Instructions (Addendum)
We saw you in the ER after you had a fall. The x-ray and the CT scan of your hip did not reveal any evidence of fracture.  We suspect that you have hip contusion.  Please be very careful with walking, and do everything possible to prevent falls. Please read the RICE instructions provided.

## 2020-01-14 NOTE — ED Provider Notes (Signed)
[facility] EMERGENCY DEPARTMENT Provider Note   CSN: [id number] Arrival date & time: 01/13/20  2106     History Chief Complaint  Patient presents with  . Fall    [name] is a 86 y.o. female.  HPI     86 year old female comes in a chief complaint of fall. Patient has history of hypertension, hyperlipidemia.  She reports having a mechanical fall around 3 PM while doing some yard work.  The fall was because of slip.  She was able to get up and get into the bed.  She has however noted significant pain on the right hip, and eventually decided to come to the ER.  She denies any head trauma, headache, focal numbness or weakness, vision change, dizziness.  Patient has no chest pain, shortness of breath.  Past Medical History:  Diagnosis Date  . Anxiety   . Arthritis    "joints" (03/16/2013)  . Breast cancer ([place])   . Bronchitis   . Chronic bronchitis ([place])    "yearly for the last 3-4 years" (03/16/2013)  . Complication of anesthesia   . Diverticulosis   . GERD (gastroesophageal reflux disease)   . Hiatal hernia   . High cholesterol   . Hypertension   . Hypothyroidism   . PONV (postoperative nausea and vomiting)   . S/P knee surgery    right knee surgery on 12-18  . Thyroid disease   . Uterine cancer [facility])     Patient Active Problem List   Diagnosis Date Noted  . Trochanteric bursitis, left hip 05/16/2019  . Rotator cuff arthropathy of left shoulder 04/27/2019  . Chronic left shoulder pain 06/01/2018  . Pain in limb-Left calf 05/19/2013  . HTN (hypertension) 03/17/2013  . Iron deficiency anemia 03/17/2013  . HTN (hypertension), malignant 03/17/2013  . Urinary retention 03/17/2013  . 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 Infection of total knee replacement ([place]) 03/15/2013  . Injury of popliteal artery 09/23/2012  . Aftercare following surgery of the circulatory system, [place] 09/23/2012  . Peripheral vascular disease, unspecified  ([place]) 09/23/2012  . Muscle soreness-Right popliteal 09/23/2012  . Septic arthritis ([place]) 05/18/2012  . Coagulase-negative staphylococcal infection 05/18/2012    Past Surgical History:  Procedure Laterality Date  . ABDOMINAL HYSTERECTOMY     "left an ovary" (03/16/2013)  . APPENDECTOMY    . BREAST BIOPSY Left   . EYE SURGERY    . GLAUCOMA SURGERY Bilateral 03/01/2013-03/08/2013  . I & D EXTREMITY Right 03/15/2013   Procedure: Exploration right total knee replacement , removal of components, and insertion of antibiotic spacer.;  Surgeon: Garald Balding, MD;  Location: [facility];  Service: Orthopedics;  Laterality: Right;  . I & D KNEE WITH POLY EXCHANGE  03/08/2012   Procedure: IRRIGATION AND DEBRIDEMENT KNEE WITH POLY EXCHANGE;  Surgeon: Garald Balding, MD;  Location: [facility];  Service: Orthopedics;  Laterality: Right;  Right Total Knee Irrigation & Debridement, Synovectomy, Poly Exchange, Placement of Antibiotic Beads  . JOINT REPLACEMENT Right Dec. 10, 2013   Knee  . JOINT REPLACEMENT Right Dec. 17, 2014   Knee I & D  . MASTECTOMY Left ~ 2005  . PATCH ANGIOPLASTY Right 01/16/10   popliteal artery repair  . TOTAL KNEE  PROSTHESIS REMOVAL W/ SPACER INSERTION Right 03/15/2013  . TOTAL KNEE ARTHROPLASTY Left 2007  . TOTAL KNEE ARTHROPLASTY Right 01/17/2011     OB History   No obstetric history on file.     Family  History  Problem Relation Age of Onset  . Diabetes Mother   . Heart disease Father   . Stroke Father   . Hypertension Father   . Heart attack Father   . Cancer Sister   . Diabetes Sister   . Hyperlipidemia Sister   . Hypertension Sister   . Cancer Brother   . Diabetes Brother   . Hypertension Brother   . Hyperlipidemia Daughter     Social History   Tobacco Use  . Smoking status: Never Smoker  . Smokeless tobacco: Never Used  Vaping Use  . Vaping Use: Never used  Substance Use Topics  . Alcohol use: No    Alcohol/week: 0.0 standard drinks  . Drug use:  No    Home Medications Prior to Admission medications   Medication Sig Start Date End Date Taking? Authorizing Provider  amLODipine (NORVASC) 5 MG tablet Take 5 mg by mouth daily.    [provider]  aspirin 81 MG tablet Take 81 mg by mouth daily.    [provider]  cyclobenzaprine (FLEXERIL) 10 MG tablet Take 1 tablet (10 mg total) by mouth 2 (two) times daily as needed for muscle spasms. 03/11/19   Gareth Morgan, MD  docusate sodium (COLACE) 100 MG capsule Take 100 mg by mouth 2 (two) times daily.    [provider]  ergocalciferol (VITAMIN D2) 50000 UNITS capsule Take 50,000 Units by mouth every 30 (thirty) days. Take on the 1st day of each month.    [provider]  indapamide (LOZOL) 1.25 MG tablet Take 1.25 mg by mouth every other day. Reported on 06/14/2015    [provider]  levothyroxine (SYNTHROID, LEVOTHROID) 50 MCG tablet Take 50 mcg by mouth daily.    [provider]  lidocaine (LIDODERM) 5 % Place 1 patch onto the skin daily. Remove & Discard patch within 12 hours or as directed by MD 03/11/19   Gareth Morgan, MD  LORazepam (ATIVAN) 0.5 MG tablet Take 0.5 mg by mouth every 8 (eight) hours as needed for anxiety. Reported on 06/14/2015 05/24/13   [provider]  Magnesium Hydroxide (MILK OF MAGNESIA PO) Take 30 mLs by mouth daily as needed (constipation).    [provider]  Oxycodone HCl 10 MG TABS Take 10 mg by mouth daily as needed (moderate to severe pain).     [provider]  pravastatin (PRAVACHOL) 40 MG tablet Take 40 mg by mouth daily.    [provider]  traMADol (ULTRAM) 50 MG tablet Take 1 tablet (50 mg total) by mouth every 6 (six) hours as needed (for pain). 11/04/18   Molpus, John, MD    Allergies    Naproxen, Nsaids, and Hydrocodone-acetaminophen  Review of Systems   Review of Systems  Constitutional: Positive for activity change.  Respiratory: Negative for shortness of  breath.   Cardiovascular: Negative for chest pain.  Gastrointestinal: Negative for nausea and vomiting.  Musculoskeletal: Positive for arthralgias and myalgias.  Neurological: Negative for seizures and headaches.  All other systems reviewed and are negative.   Physical Exam Updated Vital Signs BP (!) 146/76 (BP Location: Right Arm)   Pulse 71   Temp 98 F (36.7 C) (Oral)   Resp 16   Ht 5\' 4"  (1.626 m)   Wt 72.1 kg   SpO2 98%   BMI 27.29 kg/m   Physical Exam Vitals and nursing note reviewed.  Constitutional:      Appearance: She is well-developed.  HENT:  Head: Normocephalic and atraumatic.  Cardiovascular:     Rate and Rhythm: Normal rate.  Pulmonary:     Effort: Pulmonary effort is normal.  Abdominal:     General: Bowel sounds are normal.  Musculoskeletal:        General: Swelling and tenderness present. No deformity.     Cervical back: Normal range of motion and neck supple.     Comments: Patient has right hip tenderness and tenderness over the proximal femur.  Otherwise: Head to toe evaluation shows no hematoma, bleeding of the scalp, no facial abrasions, no spine step offs, crepitus of the chest or neck, no tenderness to palpation of the bilateral upper and lower extremities, no gross deformities, no chest tenderness, no pelvic pain.   Skin:    General: Skin is warm and dry.  Neurological:     Mental Status: She is alert and oriented to person, place, and time.     ED Results / Procedures / Treatments   Labs (all labs ordered are listed, but only abnormal results are displayed) Labs Reviewed - No data to display  EKG None  Radiology CT Hip Right Wo Contrast  Result Date: 01/14/2020 CLINICAL DATA:  86 year old female with trauma and concern for right hip fracture. EXAM: CT OF THE RIGHT HIP WITHOUT CONTRAST TECHNIQUE: Multidetector CT imaging of the right hip was performed according to the standard protocol. Multiplanar CT image reconstructions were  also generated. COMPARISON:  Right hip radiograph dated 01/13/2020 FINDINGS: Bones/Joint/Cartilage No obvious acute fracture. Slight cortical irregularity and apparenta cortical angulation involving the intertrochanteric ridge (40/2), likely chronic. If there is high clinical concern for acute fracture, further evaluation with MRI is recommended. There is no dislocation. Mild osteopenia. No significant arthritic changes. Ligaments Suboptimally assessed by CT. Muscles and Tendons No intramuscular hematoma. Soft tissues Sigmoid diverticulosis.  The soft tissues are unremarkable. IMPRESSION: No obvious acute fracture or dislocation. MRI may provide better evaluation if there is high clinical concern for acute fracture. Electronically Signed   By: Anner Crete M.D.   On: 01/14/2020 01:05   DG Hip Unilat  With Pelvis 2-3 Views Right  Result Date: 01/13/2020 CLINICAL DATA:  Fall, right upper thigh pain EXAM: DG HIP (WITH OR WITHOUT PELVIS) 2-3V RIGHT COMPARISON:  None. FINDINGS: Mild degenerative changes in the hips bilaterally with early joint space narrowing and spurring. SI joints symmetric and unremarkable. No acute bony abnormality. Specifically, no fracture, subluxation, or dislocation. IMPRESSION: No acute bony abnormality. Electronically Signed   By: Rolm Baptise M.D.   On: 01/13/2020 22:44    Procedures Procedures (including critical care time)  Medications Ordered in ED Medications  traMADol (ULTRAM) tablet 50 mg (50 mg Oral Given 01/14/20 0032)  acetaminophen (TYLENOL) tablet 650 mg (650 mg Oral Given 01/14/20 0032)    ED Course  I have reviewed the triage vital signs and the nursing notes.  Pertinent labs & imaging results that were available during my care of the patient were reviewed by me and considered in my medical decision making (see chart for details).    MDM Rules/Calculators/A&P                          86 year old female comes in a chief complaint of fall.  She had a  mechanical fall at home after she had completed some yard work.  No red flags suggesting elevated ICP.  With the fall occurring more than 8 hours prior to my assessment,  I feel comfortable clearing her brain clinically.  Patient and the daughter are also comfortable with that.  C-spine also cleared clinically.  The area of concern is the right hip and proximal femur.  X-rays are reassuring.  Patient states that she is walking but is having pain.  We will get CT scan to ensure there is no occult fracture.  1:42 AM Results of the CT scan discussed with the patient.  Since she is already able to ambulate, we have advised her to treat this like hip contusion.  She is not on any blood thinners.  She cannot tolerate NSAIDs.  We have advised that she take her home tramadol and add Tylenol if needed.  Activity as tolerated.  Final Clinical Impression(s) / ED Diagnoses Final diagnoses:  Contusion of right hip, initial encounter  Fall in home, initial encounter    Rx / DC Orders ED Discharge Orders    None       Varney Biles, MD 01/14/20 [phone]

## 2021-02-28 ENCOUNTER — Telehealth: Payer: Self-pay

## 2021-02-28 NOTE — Telephone Encounter (Signed)
Patient called [facility] office requesting an appointment for her right knee with Dr.Whitfield. I tried to call patient. No answer. Left message with our office number so that she can call our office to schedule.

## 2021-03-13 ENCOUNTER — Ambulatory Visit (INDEPENDENT_AMBULATORY_CARE_PROVIDER_SITE_OTHER): Payer: Medicare (Managed Care) | Admitting: Orthopaedic Surgery

## 2021-03-13 ENCOUNTER — Encounter: Payer: Self-pay | Admitting: Orthopaedic Surgery

## 2021-03-13 ENCOUNTER — Other Ambulatory Visit: Payer: Self-pay

## 2021-03-13 ENCOUNTER — Ambulatory Visit: Payer: Self-pay

## 2021-03-13 DIAGNOSIS — M25561 Pain in right knee: Secondary | ICD-10-CM | POA: Diagnosis not present

## 2021-03-13 DIAGNOSIS — G8929 Other chronic pain: Secondary | ICD-10-CM

## 2021-03-13 NOTE — Progress Notes (Signed)
Office Visit Note   Patient: [name]           Date of Birth: [birth year]           MRN: [id number] Visit Date: 03/13/2021              Requested by: Renaldo Reel, DO [address] [place],   [id number] PCP: Renaldo Reel, DO  Chief Complaint  Patient presents with   Right Knee - Pain      HPI Patient is a 87 year old woman with a remote history of right total knee replacement infection.  Components were removed and antibiotic spacer was placed.  She did very well and went back to all of her activities following this including bowling and it was elected not to pursue any further knee revision at that time.  She has been doing very well but has some anterior knee pain for the last week.  She denies any fever any chills any swelling in the knee.  Pain is focused over the anterior knee   Visit Diagnoses:  1. Chronic pain of right knee     Plan: Patient with remote history of knee infection with a 1 week history of anterior knee pain.  There is no signs of infection her knee is cool she actually has fairly good no motion to flexion of almost 90 degrees.  She does most everything she likes including bowling.  This is only been going on for a week.  She has a very thinned patella with slight lateral subluxation radiographs.  This seems to be near where she has some tenderness.  We have recommended Voltaren gel.  She will follow-up in 1 month.  She understands if she has any worsening symptoms before then she can contact us immediately  Follow-Up Instructions: No follow-ups on file.   Ortho Exam  Patient is alert, oriented, no adenopathy, well-dressed, normal affect, normal respiratory effort. Left knee no effusion no warmth no erythema or cellulitis.  She has some tenderness to palpation over the lateral border of the patella.  No tenderness over the medial or lateral compartments.  Compartments are soft and nontender she is able to maintain her straight  leg raise  Imaging: No results found. No images are attached to the encounter.  Labs: Lab Results  Component Value Date   HGBA1C (H) 07/06/2009    6.6 (NOTE) The ADA recommends the following therapeutic goal for glycemic control related to Hgb A1c measurement: Goal of therapy: <6.5 Hgb A1c  Reference: American Diabetes Association: Clinical Practice Recommendations 2010, Diabetes Care, 2010, 33: (Suppl  1).   ESRSEDRATE 19 04/08/2018   ESRSEDRATE 19 06/21/2017   ESRSEDRATE 19 02/10/2017   CRP 3.6 04/08/2018   CRP 3.3 06/21/2017   CRP 1.6 02/10/2017   REPTSTATUS 07/18/2013 FINAL 07/17/2013   GRAMSTAIN  03/15/2013    FEW WBC PRESENT,BOTH PMN AND MONONUCLEAR NO SQUAMOUS EPITHELIAL CELLS SEEN NO ORGANISMS SEEN Performed at [facility]  03/15/2013    FEW WBC PRESENT,BOTH PMN AND MONONUCLEAR NO SQUAMOUS EPITHELIAL CELLS SEEN NO ORGANISMS SEEN Performed at [facility]   CULT NO GROWTH Performed at [facility] 07/17/2013   LABORGA STAPHYLOCOCCUS SPECIES (COAGULASE NEGATIVE) 03/15/2013     Lab Results  Component Value Date   ALBUMIN 3.7 07/17/2013   ALBUMIN 4.2 05/10/2013   ALBUMIN 3.7 04/11/2013    Lab Results  Component Value Date   MG 2.0  07/07/2009   No results found for: VD25OH  No results found for: PREALBUMIN CBC EXTENDED Latest Ref Rng & Units 11/03/2018 04/08/2018 06/21/2017  WBC 4.0 - 10.5 K/uL 8.3 6.0 5.0  RBC 3.87 - 5.11 MIL/uL 5.10 4.96 4.85  HGB 12.0 - 15.0 g/dL 15.7(H) 14.8 14.4  HCT 36.0 - 46.0 % 46.2(H) 44.0 41.8  PLT 150 - 400 K/uL 232 232 213  NEUTROABS 1,500 - 7,800 cells/uL - 3,924 2,970  LYMPHSABS 850 - 3,900 cells/uL - 1,488 1,550     There is no height or weight on file to calculate BMI.  Orders:  Orders Placed This Encounter  Procedures   XR KNEE 3 VIEW RIGHT   No orders of the defined types were placed in this encounter.    Procedures: No procedures performed  Clinical Data: No additional  findings.  ROS:  All other systems negative, except as noted in the HPI. Review of Systems  Objective: Vital Signs: There were no vitals taken for this visit.  Specialty Comments:  No specialty comments available.  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[place]) 03/16/2013   Infection of total knee replacement ([place]) 03/15/2013   Injury of popliteal artery 09/23/2012   Aftercare following surgery of the circulatory system, [facility] 09/23/2012   Peripheral vascular disease, unspecified ([place]) 09/23/2012   Muscle soreness-Right popliteal 09/23/2012   Septic arthritis ([place]) 05/18/2012   Coagulase-negative staphylococcal infection 05/18/2012   Past Medical History:  Diagnosis Date   Anxiety    Arthritis    "joints" (03/16/2013)   Breast cancer ([place])    Bronchitis    Chronic bronchitis ([place])    "yearly for the last 3-4 years" (54/56/2563)   Complication of anesthesia    Diverticulosis    GERD (gastroesophageal reflux disease)    Hiatal hernia    High cholesterol    Hypertension    Hypothyroidism    PONV (postoperative nausea and vomiting)    S/P knee surgery    right knee surgery on 12-18   Thyroid disease    Uterine cancer ([place])     Family History  Problem Relation Age of Onset   Diabetes Mother    Heart disease Father    Stroke Father    Hypertension Father    Heart attack Father    Cancer Sister    Diabetes Sister    Hyperlipidemia Sister    Hypertension Sister    Cancer Brother    Diabetes Brother    Hypertension Brother    Hyperlipidemia Daughter     Past Surgical History:   Procedure Laterality Date   ABDOMINAL HYSTERECTOMY     "left an ovary" (03/16/2013)   APPENDECTOMY     BREAST BIOPSY Left    EYE SURGERY     GLAUCOMA SURGERY Bilateral 03/01/2013-03/08/2013   I & D EXTREMITY Right 03/15/2013   Procedure: Exploration right total knee replacement , removal of components, and insertion of antibiotic spacer.;  Surgeon: Garald Balding, MD;  Location: [facility];  Service: Orthopedics;  Laterality: Right;   I & D KNEE WITH POLY EXCHANGE  03/08/2012   Procedure: IRRIGATION AND  DEBRIDEMENT KNEE WITH POLY EXCHANGE;  Surgeon: Garald Balding, MD;  Location: [facility];  Service: Orthopedics;  Laterality: Right;  Right Total Knee Irrigation & Debridement, Synovectomy, Poly Exchange, Placement of Antibiotic Beads   JOINT REPLACEMENT Right Dec. 10, 2013   Knee   JOINT REPLACEMENT Right Dec. 17, 2014   Knee I & D   MASTECTOMY Left ~ 2005   [place] Right 01/16/10   popliteal artery repair   TOTAL KNEE  PROSTHESIS REMOVAL W/ SPACER INSERTION Right 03/15/2013   TOTAL KNEE ARTHROPLASTY Left 2007   TOTAL KNEE ARTHROPLASTY Right 01/17/2011   Social History   Occupational History   Not on file  Tobacco Use   Smoking status: Never   Smokeless tobacco: Never  Vaping Use   Vaping Use: Never used  Substance and Sexual Activity   Alcohol use: No    Alcohol/week: 0.0 standard drinks   Drug use: No   Sexual activity: Not Currently

## 2021-04-15 ENCOUNTER — Ambulatory Visit: Payer: Medicare (Managed Care) | Admitting: Orthopaedic Surgery

## 2021-07-22 ENCOUNTER — Telehealth: Payer: Self-pay | Admitting: Orthopaedic Surgery

## 2021-07-22 NOTE — Telephone Encounter (Signed)
Patient called advised she is having pain in her rt hip and rt knee. Patient said her knee is warm to the touch. Patient asked if she can be worked into [facility] schedule. The number to contact patient is [phone] or [phone] Home ?

## 2021-07-23 ENCOUNTER — Telehealth: Payer: Self-pay

## 2021-07-23 NOTE — Telephone Encounter (Signed)
Called patient back to fit her into Dr.Whitfield's schedule for tomorrow.  ?

## 2021-07-24 ENCOUNTER — Ambulatory Visit: Payer: Medicare Other | Admitting: Physician Assistant

## 2021-07-24 NOTE — Telephone Encounter (Signed)
Second attempt to contact patient to get an apt set up  ?

## 2021-08-08 ENCOUNTER — Ambulatory Visit: Payer: Medicare Other | Admitting: Physician Assistant

## 2021-08-11 ENCOUNTER — Ambulatory Visit (INDEPENDENT_AMBULATORY_CARE_PROVIDER_SITE_OTHER): Payer: Medicare Other | Admitting: Physician Assistant

## 2021-08-11 ENCOUNTER — Encounter: Payer: Self-pay | Admitting: Physician Assistant

## 2021-08-11 ENCOUNTER — Ambulatory Visit (INDEPENDENT_AMBULATORY_CARE_PROVIDER_SITE_OTHER): Payer: Medicare Other

## 2021-08-11 VITALS — Ht 64.0 in | Wt 155.0 lb

## 2021-08-11 DIAGNOSIS — M25561 Pain in right knee: Secondary | ICD-10-CM | POA: Diagnosis not present

## 2021-08-11 DIAGNOSIS — Z8739 Personal history of other diseases of the musculoskeletal system and connective tissue: Secondary | ICD-10-CM

## 2021-08-11 DIAGNOSIS — G8929 Other chronic pain: Secondary | ICD-10-CM

## 2021-08-11 NOTE — Progress Notes (Signed)
? ?Office Visit Note ?  ?Patient: [name]           ?Date of Birth: 08/02/1925           ?MRN: [id number] ?Visit Date: 08/11/2021 ?             ?Requested by: Renaldo Reel, DO ?[address] ?[address] ?[place],  [place] [address] ?PCP: Renaldo Reel, DO ? ?Chief Complaint  ?Patient presents with  ? Right Knee - Pain  ? ? ? ? ?HPI: ?Patient is a pleasant 88 year old woman with a history of a right infected knee replacement several years ago.  We last saw her in December at which time she had a little pain in her knee but there was no warmth or swelling thought to be inflammatory.  She comes in today with a 2-week history of her painful right knee which is also warm to the touch.  She denies any previous illness.  She denies any fever chills or malaise currently.  She is understandably concerned that she has had a significant infection in the past ? ?Assessment & Plan: ?Visit Diagnoses:  ?1. Chronic pain of right knee   ?2. 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 ? ?Plan: Right knee pain suspect this is mostly inflammatory.  However because of the warmth in her knee I will draw set of labs including CBC sed rate and CRP will follow-up with her and discuss this with Dr. Durward Fortes ? ?Follow-Up Instructions: No follow-ups on file.  ? ?Ortho Exam ? ?Patient is alert, oriented, no adenopathy, well-dressed, normal affect, normal respiratory effort. ?Right knee well-healed surgical incision she has no effusion I can appreciate.  She does have some warmth compared to the other side.  Some very mild pain with range of motion.  No redness no induration ? ?Imaging: ?No results found. ?No images are attached to the encounter. ? ?Labs: ?Lab Results  ?Component Value Date  ? HGBA1C (H) 07/06/2009  ?  6.6 ?(NOTE) The ADA recommends the following therapeutic goal for glycemic control related to Hgb A1c measurement: Goal of therapy: <6.5 Hgb A1c  Reference: American Diabetes Association: Clinical  Practice Recommendations 2010, Diabetes Care, 2010, 33: (Suppl ? 1).  ? ESRSEDRATE 19 04/08/2018  ? ESRSEDRATE 19 06/21/2017  ? ESRSEDRATE 19 02/10/2017  ? CRP 3.6 04/08/2018  ? CRP 3.3 06/21/2017  ? CRP 1.6 02/10/2017  ? REPTSTATUS 07/18/2013 FINAL 07/17/2013  ? GRAMSTAIN  03/15/2013  ?  FEW WBC PRESENT,BOTH PMN AND MONONUCLEAR ?NO SQUAMOUS EPITHELIAL CELLS SEEN ?NO ORGANISMS SEEN ?Performed at [facility]  ? GRAMSTAIN  03/15/2013  ?  FEW WBC PRESENT,BOTH PMN AND MONONUCLEAR ?NO SQUAMOUS EPITHELIAL CELLS SEEN ?NO ORGANISMS SEEN ?Performed at [facility]  ? CULT NO GROWTH ?Performed at [facility] 07/17/2013  ? LABORGA STAPHYLOCOCCUS SPECIES (COAGULASE NEGATIVE) 03/15/2013  ? ? ? ?Lab Results  ?Component Value Date  ? ALBUMIN 3.7 07/17/2013  ? ALBUMIN 4.2 05/10/2013  ? ALBUMIN 3.7 04/11/2013  ? ? ?Lab Results  ?Component Value Date  ? MG 2.0 07/07/2009  ? ?No results found for: VD25OH ? ?No results found for: PREALBUMIN ? ?  Latest Ref Rng & Units 11/03/2018  ? 10:14 PM 04/08/2018  ? 11:47 AM 06/21/2017  ?  4:14 PM  ?CBC EXTENDED  ?WBC 4.0 - 10.5 K/uL 8.3   6.0   5.0    ?RBC 3.87 - 5.11 MIL/uL 5.10  4.96   4.85    ?Hemoglobin 12.0 - 15.0 g/dL 15.7   14.8   14.4    ?HCT 36.0 - 46.0 % 46.2   44.0   41.8    ?Platelets 150 - 400 K/uL 232   232   213    ?NEUT# 1,500 - 7,800 cells/uL  3,924   2,970    ?Lymph# 850 - 3,900 cells/uL  1,488   1,550    ? ? ? ?Body mass index is 26.61 kg/m?. ? ?Orders:  ?Orders Placed This Encounter  ?Procedures  ? XR KNEE 3 VIEW RIGHT  ? CBC with Differential  ? C-reactive protein  ? Sed Rate (ESR)  ? ?No orders of the defined types were placed in this encounter. ? ? ? Procedures: ?No procedures performed ? ?Clinical Data: ?No additional findings. ? ?ROS: ? ?All other systems negative, except as noted in the HPI. ?Review of Systems ? ?Objective: ?Vital Signs: Ht '5\' 4"'  (1.626 m)   Wt 155 lb (70.3 kg)   BMI 26.61 kg/m?  ? ?Specialty Comments:  ?No specialty comments  available. ? ?PMFS History: ?Patient Active Problem List  ? Diagnosis Date Noted  ? Trochanteric bursitis, left hip 05/16/2019  ? Rotator cuff arthropathy of left shoulder 04/27/2019  ? Chronic left shoulder pain 06/01/2018  ? Pain in limb-Left calf 05/19/2013  ? HTN (hypertension) 03/17/2013  ? Iron deficiency anemia 03/17/2013  ? HTN (hypertension), malignant 03/17/2013  ? Urinary retention 03/17/2013  ? 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 Infection of total knee replacement ([place]) 03/15/2013  ? Injury of popliteal artery 09/23/2012  ? Aftercare following surgery of the circulatory system, [place] 09/23/2012  ? Peripheral vascular disease, unspecified ([place]) 09/23/2012  ? Muscle soreness-Right popliteal 09/23/2012  ? Septic arthritis ([place]) 05/18/2012  ? Coagulase-negative staphylococcal infection 05/18/2012  ? ?Past Medical History:  ?Diagnosis Date  ? Anxiety   ? Arthritis   ? "joints" (03/16/2013)  ? Breast cancer ([place])   ? Bronchitis   ? Chronic bronchitis ([place])   ? "yearly for the last 3-4 years" (03/16/2013)  ? Complication of anesthesia   ? Diverticulosis   ? GERD (gastroesophageal reflux disease)   ? Hiatal hernia   ? High cholesterol   ? Hypertension   ? Hypothyroidism   ? PONV (postoperative nausea and vomiting)   ? S/P knee surgery   ? right knee surgery on 12-18  ? Thyroid disease   ? Uterine cancer ([place])   ?  ?Family History  ?Problem Relation Age of Onset  ? Diabetes Mother   ? Heart disease Father   ? Stroke Father   ? Hypertension Father   ? Heart attack Father   ? Cancer Sister   ? Diabetes Sister   ? Hyperlipidemia Sister   ? Hypertension Sister   ? Cancer Brother   ? Diabetes Brother   ? Hypertension Brother   ? Hyperlipidemia Daughter   ?  ?Past Surgical History:  ?Procedure Laterality Date  ? ABDOMINAL HYSTERECTOMY    ? "left an ovary" (03/16/2013)  ? APPENDECTOMY    ? BREAST BIOPSY Left   ? EYE SURGERY    ? GLAUCOMA SURGERY Bilateral 03/01/2013-03/08/2013  ? I & D EXTREMITY Right  03/15/2013  ? Procedure: Exploration right total knee replacement , removal of components, and insertion of antibiotic spacer.;  Surgeon: Garald Balding, MD;  Location: [facility];  Service: Orthopedics;  Laterality: Right;  ? I & D KNEE WITH  POLY EXCHANGE  03/08/2012  ? Procedure: IRRIGATION AND DEBRIDEMENT KNEE WITH POLY EXCHANGE;  Surgeon: Garald Balding, MD;  Location: [facility];  Service: Orthopedics;  Laterality: Right;  Right Total Knee Irrigation & Debridement, Synovectomy, Poly Exchange, Placement of Antibiotic Beads  ? JOINT REPLACEMENT Right Dec. 10, 2013  ? Knee  ? JOINT REPLACEMENT Right Dec. 17, 2014  ? Knee I & D  ? MASTECTOMY Left ~ 2005  ? PATCH ANGIOPLASTY Right 01/16/10  ? popliteal artery repair  ? TOTAL KNEE  PROSTHESIS REMOVAL W/ SPACER INSERTION Right 03/15/2013  ? TOTAL KNEE ARTHROPLASTY Left 2007  ? TOTAL KNEE ARTHROPLASTY Right 01/17/2011  ? ?Social History  ? ?Occupational History  ? Not on file  ?Tobacco Use  ? Smoking status: Never  ? Smokeless tobacco: Never  ?Vaping Use  ? Vaping Use: Never used  ?Substance and Sexual Activity  ? Alcohol use: No  ?  Alcohol/week: 0.0 standard drinks  ? Drug use: No  ? Sexual activity: Not Currently  ? ? ? ? ? ? ?

## 2021-08-11 NOTE — Progress Notes (Signed)
c 

## 2021-08-12 LAB — CBC WITH DIFFERENTIAL/PLATELET
Absolute Monocytes: 378 cells/uL (ref 200–950)
Basophils Absolute: 31 cells/uL (ref 0–200)
Basophils Relative: 0.5 %
Eosinophils Absolute: 50 cells/uL (ref 15–500)
Eosinophils Relative: 0.8 %
HCT: 45.2 % — ABNORMAL HIGH (ref 35.0–45.0)
Hemoglobin: 15.5 g/dL (ref 11.7–15.5)
Lymphs Abs: 1469 cells/uL (ref 850–3900)
MCH: 30.7 pg (ref 27.0–33.0)
MCHC: 34.3 g/dL (ref 32.0–36.0)
MCV: 89.5 fL (ref 80.0–100.0)
MPV: 11.3 fL (ref 7.5–12.5)
Monocytes Relative: 6.1 %
Neutro Abs: 4272 cells/uL (ref 1500–7800)
Neutrophils Relative %: 68.9 %
Platelets: 227 10*3/uL (ref 140–400)
RBC: 5.05 10*6/uL (ref 3.80–5.10)
RDW: 13.7 % (ref 11.0–15.0)
Total Lymphocyte: 23.7 %
WBC: 6.2 10*3/uL (ref 3.8–10.8)

## 2021-08-12 LAB — C-REACTIVE PROTEIN: CRP: 1.5 mg/L (ref <8.0)

## 2021-08-12 LAB — SEDIMENTATION RATE: Sed Rate: 19 mm/h (ref 0–30)

## 2021-08-20 IMAGING — CT CT HIP*R* W/O CM
2 of 3 series · 17 of 46 positions shown, 19 images · non-contrast
Comparison: Right hip radiograph dated 01/13/2020

CLINICAL DATA: [AGE] female with trauma and concern for right
hip fracture.

EXAM:
CT OF THE RIGHT HIP WITHOUT CONTRAST
TECHNIQUE: Multidetector CT imaging of the right hip was performed according to
the standard protocol. Multiplanar CT image reconstructions were
also generated.

[Series 5: coronal st · coronal · 0.41mm/px · 3 of 123 slices shown]
[im 41/123  soft-tissue]
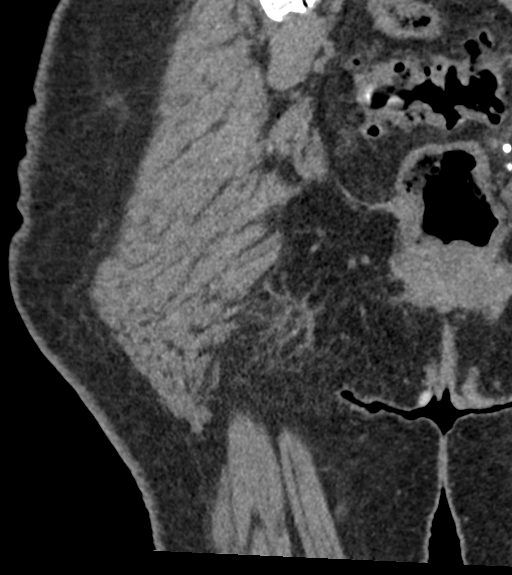
[im 55/123  soft-tissue]
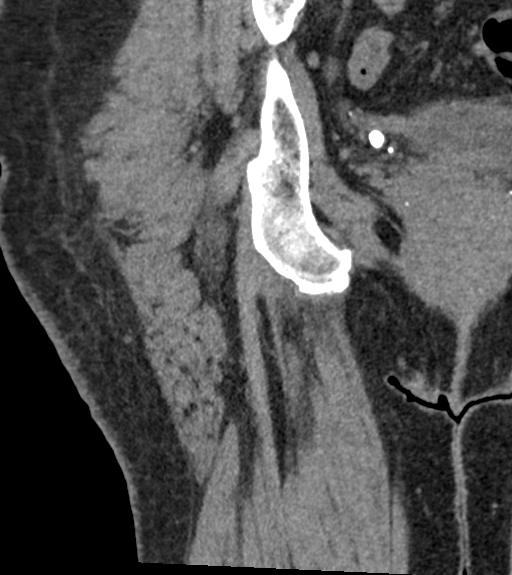
[im 68/123  soft-tissue]
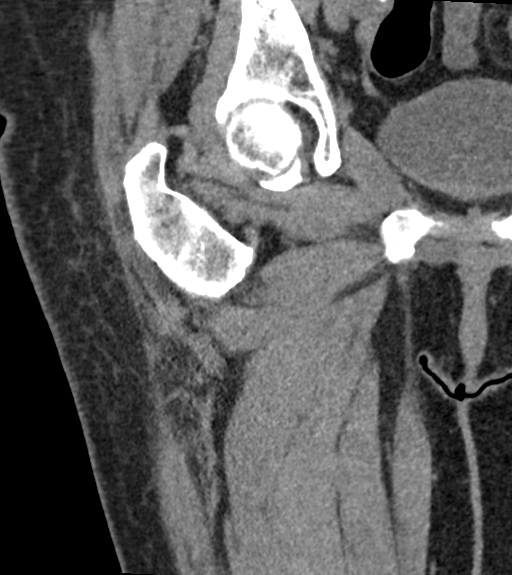

[Series 9: axial soft tissue · axial · 0.52mm/px · z∈[-427,-221]mm · 14 of 119 slices shown, 16 images]
[im 8/119  soft-tissue]
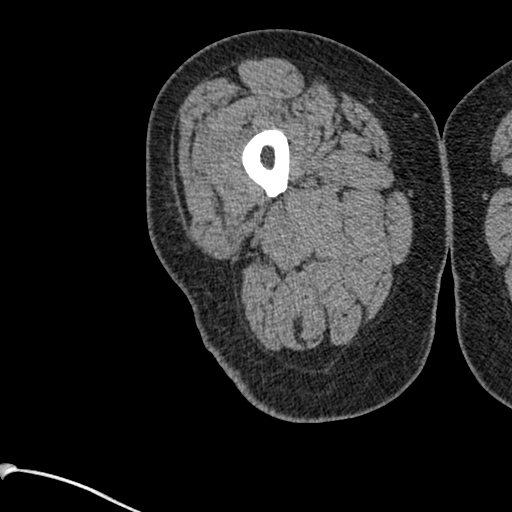
[im 8/119  bone]
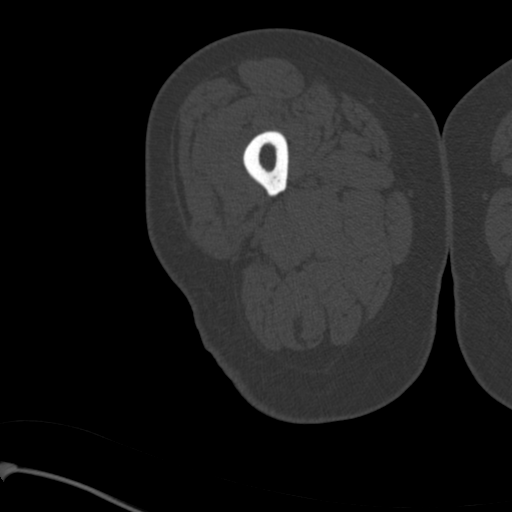
[im 16/119  soft-tissue]
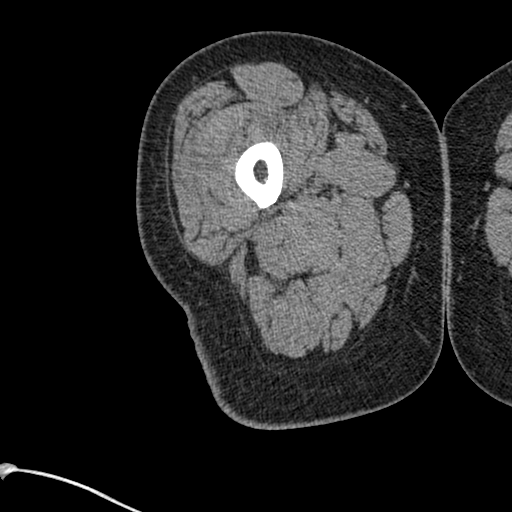
[im 23/119  soft-tissue]
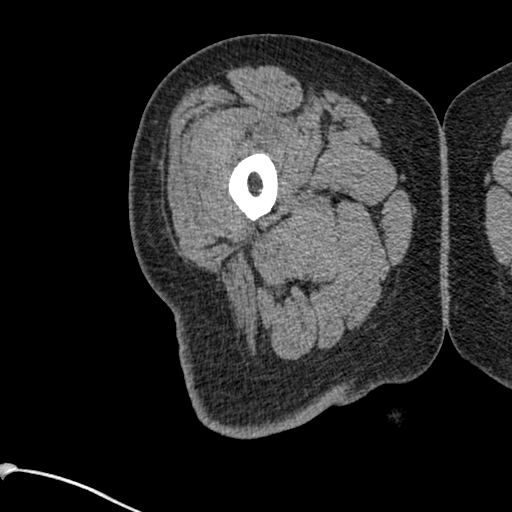
[im 31/119  soft-tissue]
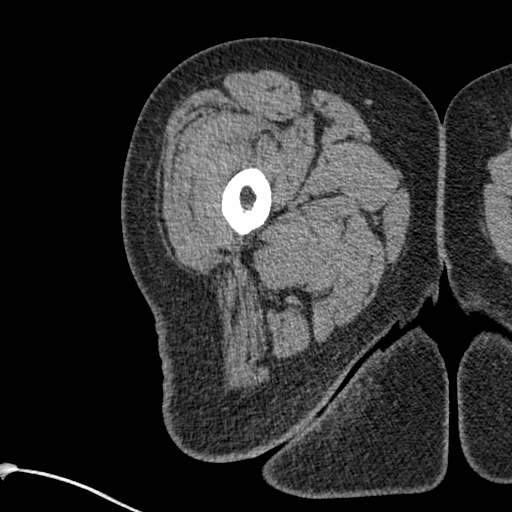
[im 39/119  soft-tissue]
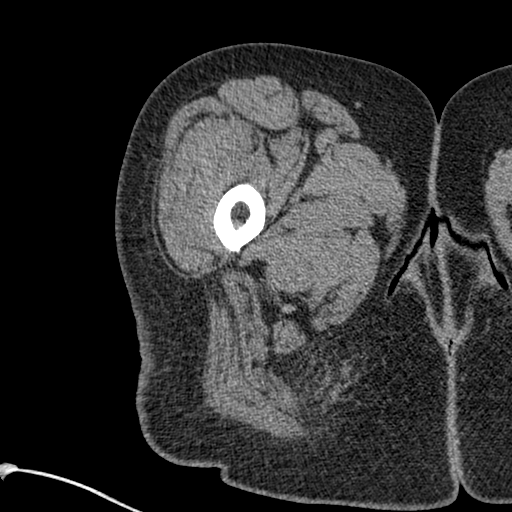
[im 46/119  soft-tissue]
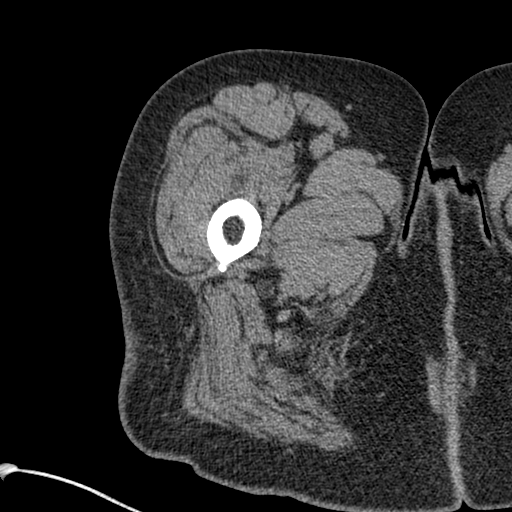
[im 54/119  soft-tissue]
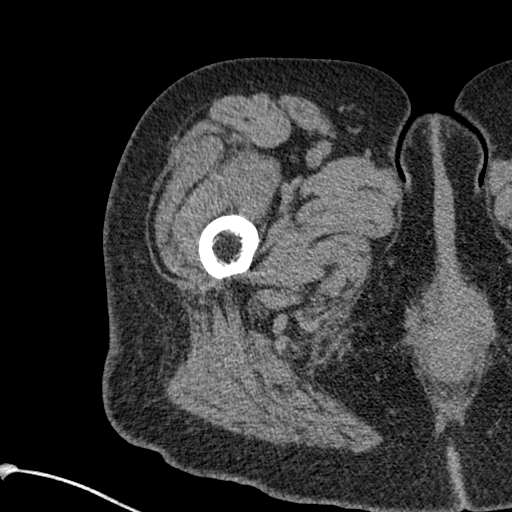
[im 65/119  soft-tissue]
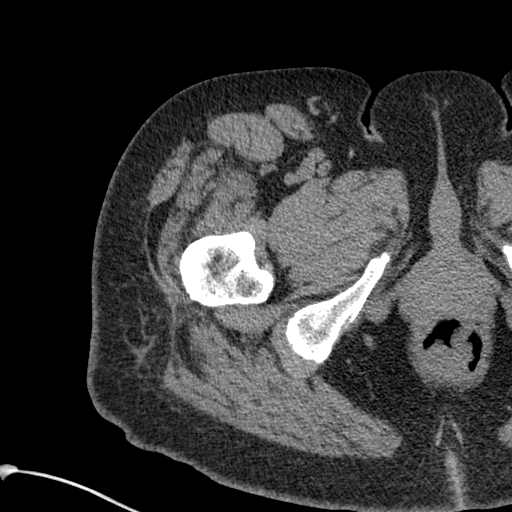
[im 73/119  soft-tissue]
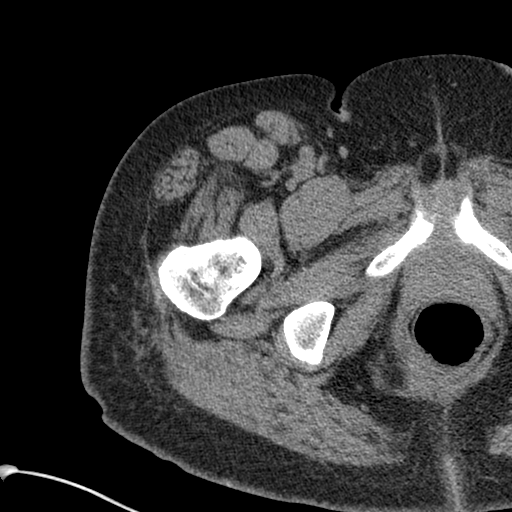
[im 73/119  bone]
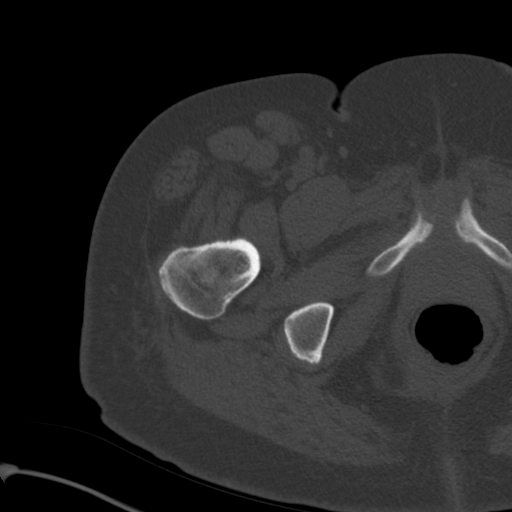
[im 80/119  soft-tissue]
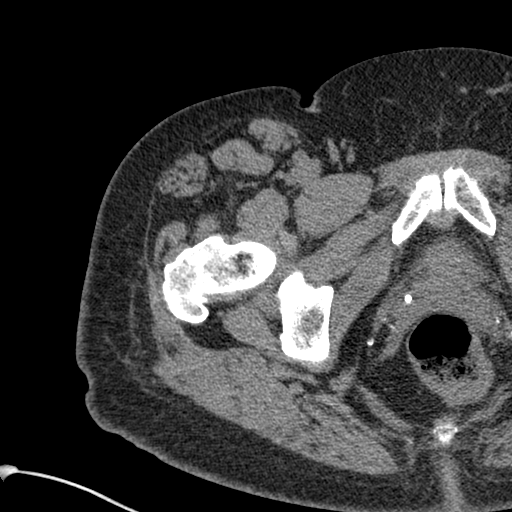
[im 88/119  soft-tissue]
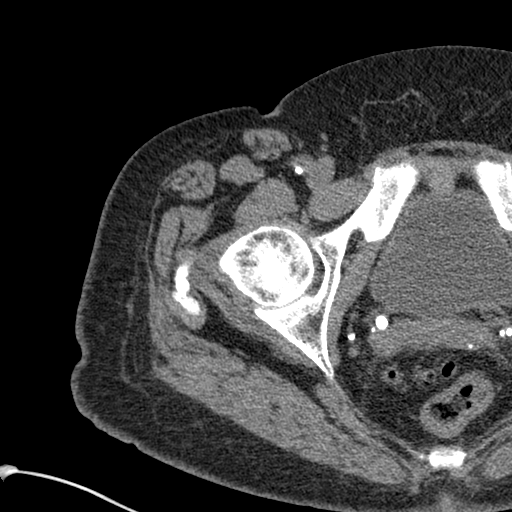
[im 96/119  soft-tissue]
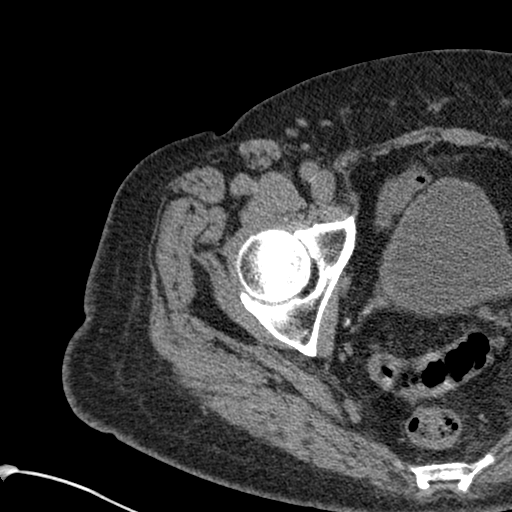
[im 103/119  soft-tissue]
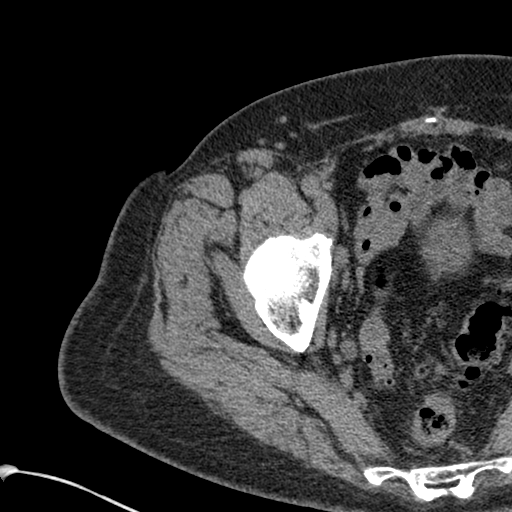
[im 111/119  soft-tissue]
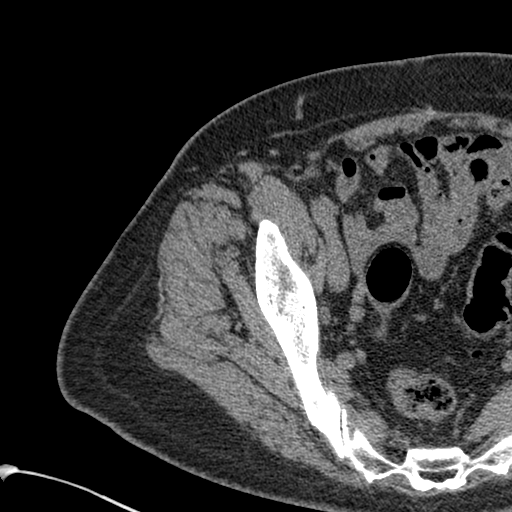

[17 of 46 positions shown; findings below may reference images not displayed]

FINDINGS: Bones/Joint/Cartilage

No obvious acute fracture. Slight cortical irregularity and
apparenta cortical angulation involving the intertrochanteric ridge
(40/2), likely chronic. If there is high clinical concern for acute
fracture, further evaluation with MRI is recommended. There is no
dislocation. Mild osteopenia. No significant arthritic changes.

Ligaments

Suboptimally assessed by CT.

Muscles and Tendons

No intramuscular hematoma.

Soft tissues

Sigmoid diverticulosis.  The soft tissues are unremarkable.
IMPRESSION: No obvious acute fracture or dislocation. MRI may provide better
evaluation if there is high clinical concern for acute fracture.

## 2021-08-21 ENCOUNTER — Ambulatory Visit: Payer: BC Managed Care – PPO | Admitting: Orthopaedic Surgery

## 2021-12-30 ENCOUNTER — Ambulatory Visit (INDEPENDENT_AMBULATORY_CARE_PROVIDER_SITE_OTHER): Payer: Medicare PPO | Admitting: Physician Assistant

## 2021-12-30 ENCOUNTER — Encounter: Payer: Self-pay | Admitting: Physician Assistant

## 2021-12-30 ENCOUNTER — Ambulatory Visit (INDEPENDENT_AMBULATORY_CARE_PROVIDER_SITE_OTHER): Payer: Medicare PPO

## 2021-12-30 DIAGNOSIS — M25552 Pain in left hip: Secondary | ICD-10-CM

## 2021-12-30 DIAGNOSIS — M12812 Other specific arthropathies, not elsewhere classified, left shoulder: Secondary | ICD-10-CM

## 2021-12-30 NOTE — Progress Notes (Signed)
Office Visit Note   Patient: [name]           Date of Birth: Dec 27, 1925           MRN: [id number] Visit Date: 12/30/2021              Requested by: Renaldo Reel, DO [address] [place],  [place] [address] PCP: Renaldo Reel, DO   Assessment & Plan: Visit Diagnoses:  1. Pain in left hip   2. Rotator cuff arthropathy of left shoulder     Plan: Patient is a 88 year old pleasant woman who fell onto her left side about a week and a half ago.  She did not think much of it.  She actually went bowling after this.  Over the weekend she had increasing pain especially in her left posterior buttock over her posterior pubic rami.  Denies any groin pain.  Denies any weakness any loss of bowel or bladder control any lower back or radicular findings.  She had both x-rays and a CAT scan done as well as x-rays of her left shoulder.  With regards to her left shoulder she has had x-rays in the past that could indicate a rotator cuff tear and today she could not hold her arm up indicative of probably a complete rotator cuff tear.  Discussed this with her and her daughter.  They would like to consider whether they want an MRI as they know this would be probably showing a rotator cuff tear which would be best fixed by surgery.  Alternatively she could do therapy.  With regards to her pain is mostly in the posterior pubic rami.  It near the hamstring insertion.  She has good strength in her lower extremities.  Her daughter said she has had steroid shots in before and they have seem to have helped her.  She cannot take oral anti-inflammatories.  We will go forward with an injection of 40 mg of Depo-Medrol today.  She should follow-up with Dr. Durward Fortes in 1 week should call sooner if any increasing symptoms.  Cautioned her family with regards to narcotics and constipation and balance issues  Follow-Up Instructions: Return in about 1 week (around 01/06/2022).   Orders:  Orders Placed  This Encounter  Procedures   XR HIP UNILAT W OR W/O PELVIS 2-3 VIEWS LEFT   No orders of the defined types were placed in this encounter.     Procedures: No procedures performed   Clinical Data: No additional findings.   Subjective:  HPI patient is a pleasant 88 year old active woman who is a patient of Dr. Durward Fortes.  She is almost 2 weeks status post falling on her left side while going down some stairs.  She originally did not have too much pain and in fact went bowling.  Over the course of later last week she began having increasing pain in her posterior buttock and left shoulder.  Her daughter took her to an urgent care near where they live and x-rays of her left shoulder were negative as were x-rays and a CT scan of her left hip.  She is most concerned about pain in her left hip denies any groin pain but points to the posterior  Review of Systems  All other systems reviewed and are negative.    Objective: Vital Signs: There were no vitals taken for this visit.  Physical Exam Constitutional:      Appearance: Normal appearance.  Pulmonary:  Effort: Pulmonary effort is normal.  Neurological:     General: No focal deficit present.     Mental Status: She is alert.     Ortho Exam Examination of her left lower back no pain over the vertebrae noticed spasm in the paravertebral muscles.  She is focally tender in the posterior buttock over the posterior ischium does not radiate downward her strength on the left side is 5 out of 5 with resisted dorsiflexion plantarflexion of her ankle extension flexion of her legs she is able to hold a straight leg raise.  Sensation is intact.  She has no groin pain no pain with range of motion of her left hip Left shoulder no bruising no ecchymosis distal strength is intact distal pulses intact no radicular findings or radiation.  I am able to passively raise her arm but she cannot hold it up and has a positive drop arm test.  No tenderness  over the humerus or elbow. Specialty Comments:  No specialty comments available.  Imaging: XR HIP UNILAT W OR W/O PELVIS 2-3 VIEWS LEFT  Result Date: 12/30/2021 Radiographs of her pelvis left hip were reviewed in multiple projections today.  There is no evidence of an acute or chronic fracture.  Femoral head is reduced in the acetabulum there are some degenerative changes and sclerotic changes and joint space narrowing.  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[place]) 03/16/2013   Infection of total knee replacement ([place]) 03/15/2013   Injury of popliteal artery 09/23/2012   Aftercare following surgery of the circulatory system, NEC 09/23/2012   Peripheral vascular disease, unspecified ([place]) 09/23/2012   Muscle soreness-Right popliteal 09/23/2012   Septic arthritis ([place]) 05/18/2012   Coagulase-negative staphylococcal infection 05/18/2012   Past Medical History:  Diagnosis Date   Anxiety    Arthritis    "joints" (03/16/2013)   Breast cancer ([place])    Bronchitis    Chronic bronchitis ([place])    "yearly for the last 3-4 years" (22/48/2500)   Complication of anesthesia    Diverticulosis    GERD (gastroesophageal reflux disease)    Hiatal hernia    High cholesterol    Hypertension    Hypothyroidism    PONV (postoperative nausea and vomiting)    S/P knee surgery    right knee surgery on 12-18   Thyroid disease    Uterine cancer ([place])     Family History   Problem Relation Age of Onset   Diabetes Mother    Heart disease Father    Stroke Father    Hypertension Father    Heart attack Father    Cancer Sister    Diabetes Sister    Hyperlipidemia Sister    Hypertension Sister    Cancer Brother    Diabetes Brother    Hypertension Brother    Hyperlipidemia Daughter     Past Surgical History:  Procedure Laterality Date   ABDOMINAL HYSTERECTOMY     "  left an ovary" (03/16/2013)   APPENDECTOMY     BREAST BIOPSY Left    EYE SURGERY     GLAUCOMA SURGERY Bilateral 03/01/2013-03/08/2013   I & D EXTREMITY Right 03/15/2013   Procedure: Exploration right total knee replacement , removal of components, and insertion of antibiotic spacer.;  Surgeon: Garald Balding, MD;  Location: [facility];  Service: Orthopedics;  Laterality: Right;   I & D KNEE WITH POLY EXCHANGE  03/08/2012   Procedure: IRRIGATION AND DEBRIDEMENT KNEE WITH POLY EXCHANGE;  Surgeon: Garald Balding, MD;  Location: [facility];  Service: Orthopedics;  Laterality: Right;  Right Total Knee Irrigation & Debridement, Synovectomy, Poly Exchange, Placement of Antibiotic Beads   JOINT REPLACEMENT Right Dec. 10, 2013   Knee   JOINT REPLACEMENT Right Dec. 17, 2014   Knee I & D   MASTECTOMY Left ~ 2005   [place] Right 01/16/10   popliteal artery repair   TOTAL KNEE  PROSTHESIS REMOVAL W/ SPACER INSERTION Right 03/15/2013   TOTAL KNEE ARTHROPLASTY Left 2007   TOTAL KNEE ARTHROPLASTY Right 01/17/2011   Social History   Occupational History   Not on file  Tobacco Use   Smoking status: Never   Smokeless tobacco: Never  Vaping Use   Vaping Use: Never used  Substance and Sexual Activity   Alcohol use: No    Alcohol/week: 0.0 standard drinks of alcohol   Drug use: No   Sexual activity: Not Currently

## 2022-01-06 ENCOUNTER — Ambulatory Visit (INDEPENDENT_AMBULATORY_CARE_PROVIDER_SITE_OTHER): Payer: Medicare PPO

## 2022-01-06 ENCOUNTER — Encounter: Payer: Self-pay | Admitting: Orthopaedic Surgery

## 2022-01-06 ENCOUNTER — Ambulatory Visit (INDEPENDENT_AMBULATORY_CARE_PROVIDER_SITE_OTHER): Payer: Medicare PPO | Admitting: Orthopaedic Surgery

## 2022-01-06 DIAGNOSIS — M12812 Other specific arthropathies, not elsewhere classified, left shoulder: Secondary | ICD-10-CM | POA: Diagnosis not present

## 2022-01-06 DIAGNOSIS — M7062 Trochanteric bursitis, left hip: Secondary | ICD-10-CM | POA: Diagnosis not present

## 2022-01-06 MED ORDER — LIDOCAINE HCL 2 % IJ SOLN
2.0000 mL | INTRAMUSCULAR | Status: AC | PRN
  Administered 2022-01-06: 2 mL

## 2022-01-06 MED ORDER — METHYLPREDNISOLONE ACETATE 40 MG/ML IJ SUSP
80.0000 mg | INTRAMUSCULAR | Status: AC | PRN
  Administered 2022-01-06: 80 mg via INTRA_ARTICULAR

## 2022-01-06 MED ORDER — BUPIVACAINE HCL 0.25 % IJ SOLN
2.0000 mL | INTRAMUSCULAR | Status: AC | PRN
  Administered 2022-01-06: 2 mL via INTRA_ARTICULAR

## 2022-01-06 NOTE — Progress Notes (Signed)
Office Visit Note   Patient: [name]           Date of Birth: Nov 07, 1925           MRN: [id number] Visit Date: 01/06/2022              Requested by: Renaldo Reel, DO [address] [place],  [place] [address] PCP: Renaldo Reel, DO   Assessment & Plan: Visit Diagnoses:  1. Rotator cuff arthropathy of left shoulder   2. Trochanteric bursitis, left hip     Plan: [name] is several weeks status post fall when she injured her left shoulder and left hip.  Her left hip is feeling better.  She is walking without ambulatory aid.  She still having a problem with her left shoulder with some weakness with overhead motion.  I suspect she has had a rotator cuff tear.  After much discussion I will inject her subacromial space with Marcaine, Xylocaine and Depo-Medrol.  If she is not much better over the next several weeks I like her to call and consider an MRI scan.  She did note that she like to consider rotator cuff tear repair or even replacement should an MRI scan noted a massive tear.  She will let me know how she does over the next several weeks  Follow-Up Instructions: Return if symptoms worsen or fail to improve.   Orders:  Orders Placed This Encounter  Procedures   XR Shoulder Left   No orders of the defined types were placed in this encounter.     Procedures: Large Joint Inj: L subacromial bursa on 01/06/2022 3:42 PM Indications: pain and diagnostic evaluation Details: 25 G 1.5 in needle, anterolateral approach  Arthrogram: No  Medications: 2 mL lidocaine 2 %; 80 mg methylPREDNISolone acetate 40 MG/ML; 2 mL bupivacaine 0.25 % Consent was given by the patient. Immediately prior to procedure a time out was called to verify the correct patient, procedure, equipment, support staff and site/side marked as required. Patient was prepped and draped in the usual sterile fashion.       Clinical Data: No additional findings.   Subjective: Chief  Complaint  Patient presents with   Left Hip - Pain, Follow-up   Left Shoulder - Pain, Follow-up  Several weeks status post fall injuring left shoulder and left hip.  Films of her hip which initially were causing her more trouble were negative for any fracture.  She is feeling better but still having trouble with her left shoulder.  Having some pain with overhead motion.  She is left-hand nondominant  HPI  Review of Systems   Objective: Vital Signs: There were no vitals taken for this visit.  Physical Exam Constitutional:      Appearance: She is well-developed.  Pulmonary:     Effort: Pulmonary effort is normal.  Skin:    General: Skin is warm and dry.  Neurological:     Mental Status: She is alert and oriented to person, place, and time.  Psychiatric:        Behavior: Behavior normal.     Ortho Exam awake alert and oriented x3.  Comfortable sitting.  No acute distress.  Able to place her left arm overhead which is better than her last evaluation but she definitely has some weakness with internal/external rotation.  Positive impingement testing.  No crepitation.  I think the biceps muscle is in an inferior position consistent with the long head of  the biceps tear good grip and good release.  Specialty Comments:  No specialty comments available.  Imaging: XR Shoulder Left  Result Date: 01/06/2022 Films of the left shoulder obtained and several projections.  There is some narrowing of the glenohumeral joint and the lateral film consistent with arthritis and there is bulky arthritic changes at the [facility] joint with a superiorly and inferiorly directed spurs.  There is some mild migration of the humeral head superiorly and a borderline space between the humeral head and the acromion possibly consistent with rotator cuff tear on a chronic basis.  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[place]) 03/16/2013   Infection of total knee replacement ([place]) 03/15/2013   Injury of popliteal artery 09/23/2012   Aftercare following surgery of the circulatory system, NEC 09/23/2012   Peripheral vascular disease, unspecified ([place]) 09/23/2012   Muscle soreness-Right popliteal 09/23/2012   Septic arthritis ([place]) 05/18/2012   Coagulase-negative staphylococcal infection 05/18/2012   Past Medical History:  Diagnosis Date   Anxiety    Arthritis    "joints" (03/16/2013)   Breast cancer ([place])    Bronchitis    Chronic bronchitis ([place])    "yearly for the last 3-4 years" (16/12/9602)   Complication of anesthesia    Diverticulosis    GERD (gastroesophageal reflux disease)    Hiatal hernia    High cholesterol    Hypertension    Hypothyroidism    PONV (postoperative nausea and vomiting)    S/P knee surgery    right knee surgery on 12-18   Thyroid disease    Uterine cancer ([place])     Family History  Problem Relation Age of Onset   Diabetes Mother    Heart disease Father    Stroke Father    Hypertension Father    Heart attack Father    Cancer Sister    Diabetes Sister    Hyperlipidemia Sister    Hypertension Sister    Cancer Brother    Diabetes Brother    Hypertension Brother    Hyperlipidemia Daughter     Past Surgical History:  Procedure Laterality Date   ABDOMINAL HYSTERECTOMY     "left an ovary" (03/16/2013)   APPENDECTOMY     BREAST BIOPSY Left    EYE SURGERY     GLAUCOMA SURGERY Bilateral 03/01/2013-03/08/2013   I & D EXTREMITY Right 03/15/2013   Procedure: Exploration right total knee replacement ,  removal of components, and insertion of antibiotic spacer.;  Surgeon: Garald Balding, MD;  Location: [facility];  Service: Orthopedics;  Laterality: Right;   I & D KNEE WITH POLY EXCHANGE  03/08/2012   Procedure: IRRIGATION AND DEBRIDEMENT KNEE WITH POLY EXCHANGE;  Surgeon: Garald Balding, MD;  Location: [facility];  Service: Orthopedics;  Laterality: Right;  Right Total Knee Irrigation & Debridement, Synovectomy, Poly Exchange, Placement of Antibiotic Beads   JOINT REPLACEMENT Right Dec. 10, 2013   Knee   JOINT REPLACEMENT Right Dec. 17, 2014   Knee I & D  MASTECTOMY Left ~ 2005   PATCH ANGIOPLASTY Right 01/16/10   popliteal artery repair   TOTAL KNEE  PROSTHESIS REMOVAL W/ SPACER INSERTION Right 03/15/2013   TOTAL KNEE ARTHROPLASTY Left 2007   TOTAL KNEE ARTHROPLASTY Right 01/17/2011   Social History   Occupational History   Not on file  Tobacco Use   Smoking status: Never   Smokeless tobacco: Never  Vaping Use   Vaping Use: Never used  Substance and Sexual Activity   Alcohol use: No    Alcohol/week: 0.0 standard drinks of alcohol   Drug use: No   Sexual activity: Not Currently     Garald Balding, MD   Note - This record has been created using Editor, commissioning.  Chart creation errors have been sought, but may not always  have been located. Such creation errors do not reflect on  the standard of medical care.

## 2022-01-14 ENCOUNTER — Telehealth: Payer: Self-pay | Admitting: Physician Assistant

## 2022-01-14 NOTE — Telephone Encounter (Signed)
[facility]- has sent a rx for  shoulder brace, please respond. [phone]

## 2022-05-12 ENCOUNTER — Ambulatory Visit (INDEPENDENT_AMBULATORY_CARE_PROVIDER_SITE_OTHER): Payer: Medicare HMO | Admitting: Physician Assistant

## 2022-05-12 ENCOUNTER — Ambulatory Visit (INDEPENDENT_AMBULATORY_CARE_PROVIDER_SITE_OTHER): Payer: Medicare HMO

## 2022-05-12 ENCOUNTER — Telehealth: Payer: Self-pay

## 2022-05-12 ENCOUNTER — Ambulatory Visit: Payer: BC Managed Care – PPO | Admitting: Physician Assistant

## 2022-05-12 ENCOUNTER — Encounter: Payer: Self-pay | Admitting: Physician Assistant

## 2022-05-12 DIAGNOSIS — M25551 Pain in right hip: Secondary | ICD-10-CM | POA: Diagnosis not present

## 2022-05-12 MED ORDER — LIDOCAINE HCL 1 % IJ SOLN
2.0000 mL | INTRAMUSCULAR | Status: AC | PRN
  Administered 2022-05-12: 2 mL

## 2022-05-12 MED ORDER — METHYLPREDNISOLONE ACETATE 40 MG/ML IJ SUSP
40.0000 mg | INTRAMUSCULAR | Status: AC | PRN
  Administered 2022-05-12: 40 mg via INTRA_ARTICULAR

## 2022-05-12 MED ORDER — BUPIVACAINE HCL 0.25 % IJ SOLN
2.0000 mL | INTRAMUSCULAR | Status: AC | PRN
  Administered 2022-05-12: 2 mL via INTRA_ARTICULAR

## 2022-05-12 NOTE — Telephone Encounter (Signed)
Pt called and was r/s to 3:15 due to "moving slowly"

## 2022-05-12 NOTE — Progress Notes (Signed)
Office Visit Note   Patient: [name]           Date of Birth: [birth year]           MRN: [id number] Visit Date: 05/12/2022              Requested by: Renaldo Reel, DO [address] [place],  [place] [address] PCP: Renaldo Reel, DO  Chief Complaint  Patient presents with  . Right Hip - Pain      HPI: Jemini is a pleasant 89 year old woman who I treated for left trochanteric bursitis in the past.  She presents today with onset of right lateral hip pain.  Denies any injuries though she did step down off a step that was a little higher than what she was used to.  She was with her family she did not fall since last night she has had pain on the lateral side of her hip no pain in her groin no rate radicular findings down her leg no weakness.  She points to one area that hurts her quite a bit.  Assessment & Plan: Visit Diagnoses:  1. Pain of right hip     Plan: Exam consistent with trochanteric bursitis of the right hip.  Will go forward with an injection today.  Will also give her some Toradol IM.  She is attending her twin sister's funeral tomorrow hopefully this will make her more comfortable.  If she has any other concerns and over the family they should contact me immediately have given them strict return precautions.  After injection she did voice some relief  Follow-Up Instructions: No follow-ups on file.   Ortho Exam  Patient is alert, oriented, no adenopathy, well-dressed, normal affect, normal respiratory effort. Right hip she has no redness no erythema.  She is focally tender over the greater trochanteric bursa.  No real groin pain.  Manipulation of her hip reproduces pain specifically over the troches bursa not in the groin.  She has good sensation and no weakness in her leg.  Imaging: XR HIP UNILAT W OR W/O PELVIS 1V RIGHT  Result Date: 05/12/2022 2 views AP of the right hip demonstrate well-maintained alignment no evidence of any fracture.   She has some degenerative changes throughout her felt pelvis but overall the right hip joint is fairly well-preserved  No images are attached to the encounter.  Labs: Lab Results  Component Value Date   HGBA1C (H) 07/06/2009    6.6 (NOTE) The ADA recommends the following therapeutic goal for glycemic control related to Hgb A1c measurement: Goal of therapy: <6.5 Hgb A1c  Reference: American Diabetes Association: Clinical Practice Recommendations 2010, Diabetes Care, 2010, 33: (Suppl  1).   ESRSEDRATE 19 08/11/2021   ESRSEDRATE 19 04/08/2018   ESRSEDRATE 19 06/21/2017   CRP 1.5 08/11/2021   CRP 3.6 04/08/2018   CRP 3.3 06/21/2017   REPTSTATUS 07/18/2013 FINAL 07/17/2013   GRAMSTAIN  03/15/2013    FEW WBC PRESENT,BOTH PMN AND MONONUCLEAR NO SQUAMOUS EPITHELIAL CELLS SEEN NO ORGANISMS SEEN Performed at [facility]  03/15/2013    FEW WBC PRESENT,BOTH PMN AND MONONUCLEAR NO SQUAMOUS EPITHELIAL CELLS SEEN NO ORGANISMS SEEN Performed at [facility]   CULT NO GROWTH Performed at [facility] 07/17/2013   LABORGA STAPHYLOCOCCUS SPECIES (COAGULASE NEGATIVE) 03/15/2013     Lab Results  Component Value Date   ALBUMIN 3.7 07/17/2013   ALBUMIN 4.2 05/10/2013  ALBUMIN 3.7 04/11/2013    Lab Results  Component Value Date   MG 2.0 07/07/2009   No results found for: "VD25OH"  No results found for: "PREALBUMIN"    Latest Ref Rng & Units 08/11/2021   11:33 AM 11/03/2018   10:14 PM 04/08/2018   11:47 AM  CBC EXTENDED  WBC 3.8 - 10.8 Thousand/uL 6.2  8.3  6.0   RBC 3.80 - 5.10 Million/uL 5.05  5.10  4.96   Hemoglobin 11.7 - 15.5 g/dL 15.5  15.7  14.8   HCT 35.0 - 45.0 % 45.2  46.2  44.0   Platelets 140 - 400 Thousand/uL 227  232  232   NEUT# 1,500 - 7,800 cells/uL 4,272   3,924   Lymph# 850 - 3,900 cells/uL 1,469   1,488      There is no height or weight on file to calculate BMI.  Orders:  Orders Placed This Encounter  Procedures  . XR  HIP UNILAT W OR W/O PELVIS 1V RIGHT   No orders of the defined types were placed in this encounter.    Procedures: Large Joint Inj: R greater trochanter on 05/12/2022 4:25 PM Indications: pain and diagnostic evaluation Details: 25 G 1.5 in needle, lateral approach  Arthrogram: No  Medications: 2 mL lidocaine 1 %; 40 mg methylPREDNISolone acetate 40 MG/ML; 2 mL bupivacaine 0.25 % Outcome: tolerated well, no immediate complications Procedure, treatment alternatives, risks and benefits explained, specific risks discussed. Consent was given by the patient.    Clinical Data: No additional findings.  ROS:  All other systems negative, except as noted in the HPI. Review of Systems  Objective: Vital Signs: There were no vitals taken for this visit.  Specialty Comments:  No specialty comments available.  PMFS History: Patient Active Problem List   Diagnosis Date Noted  . Pain in left hip 12/30/2021  . Pain in right knee 08/11/2021  . Trochanteric bursitis, left hip 05/16/2019  . Rotator cuff arthropathy of left shoulder 04/27/2019  . Chronic left shoulder pain 06/01/2018  . Pain in limb-Left calf 05/19/2013  . HTN (hypertension) 03/17/2013  . Iron deficiency anemia 03/17/2013  . HTN (hypertension), malignant 03/17/2013  . Urinary retention 03/17/2013  . Infection of total joint prosthesis ([place]) 03/16/2013  . Infection of total knee replacement ([place]) 03/15/2013  . Injury of popliteal artery 09/23/2012  . Aftercare following surgery of the circulatory system, [place] 09/23/2012  . Peripheral vascular disease, unspecified ([place]) 09/23/2012  . Muscle soreness-Right popliteal 09/23/2012  . Septic arthritis ([place]) 05/18/2012  . Coagulase-negative staphylococcal infection 05/18/2012   Past Medical History:  Diagnosis Date  . Anxiety   . Arthritis    "joints" (03/16/2013)  . Breast cancer ([place])   . Bronchitis   . Chronic bronchitis ([place])    "yearly for the last 3-4 years"  (03/16/2013)  . Complication of anesthesia   . Diverticulosis   . GERD (gastroesophageal reflux disease)   . Hiatal hernia   . High cholesterol   . Hypertension   . Hypothyroidism   . PONV (postoperative nausea and vomiting)   . S/P knee surgery    right knee surgery on 12-18  . Thyroid disease   . Uterine cancer ([place])     Family History  Problem Relation Age of Onset  . Diabetes Mother   . Heart disease Father   . Stroke Father   . Hypertension Father   . Heart attack Father   . Cancer Sister   . Diabetes  Sister   . Hyperlipidemia Sister   . Hypertension Sister   . Cancer Brother   . Diabetes Brother   . Hypertension Brother   . Hyperlipidemia Daughter     Past Surgical History:  Procedure Laterality Date  . ABDOMINAL HYSTERECTOMY     "left an ovary" (03/16/2013)  . APPENDECTOMY    . BREAST BIOPSY Left   . EYE SURGERY    . GLAUCOMA SURGERY Bilateral 03/01/2013-03/08/2013  . I & D EXTREMITY Right 03/15/2013   Procedure: Exploration right total knee replacement , removal of components, and insertion of antibiotic spacer.;  Surgeon: Garald Balding, MD;  Location: [facility];  Service: Orthopedics;  Laterality: Right;  . I & D KNEE WITH POLY EXCHANGE  03/08/2012   Procedure: IRRIGATION AND DEBRIDEMENT KNEE WITH POLY EXCHANGE;  Surgeon: Garald Balding, MD;  Location: [facility];  Service: Orthopedics;  Laterality: Right;  Right Total Knee Irrigation & Debridement, Synovectomy, Poly Exchange, Placement of Antibiotic Beads  . JOINT REPLACEMENT Right Dec. 10, 2013   Knee  . JOINT REPLACEMENT Right Dec. 17, 2014   Knee I & D  . MASTECTOMY Left ~ 2005  . PATCH ANGIOPLASTY Right 01/16/10   popliteal artery repair  . TOTAL KNEE  PROSTHESIS REMOVAL W/ SPACER INSERTION Right 03/15/2013  . TOTAL KNEE ARTHROPLASTY Left 2007  . TOTAL KNEE ARTHROPLASTY Right 01/17/2011   Social History   Occupational History  . Not on file  Tobacco Use  . Smoking status: Never  . Smokeless  tobacco: Never  Vaping Use  . Vaping Use: Never used  Substance and Sexual Activity  . Alcohol use: No    Alcohol/week: 0.0 standard drinks of alcohol  . Drug use: No  . Sexual activity: Not Currently

## 2022-06-17 ENCOUNTER — Encounter: Payer: Self-pay | Admitting: Physician Assistant

## 2022-06-17 ENCOUNTER — Other Ambulatory Visit (INDEPENDENT_AMBULATORY_CARE_PROVIDER_SITE_OTHER): Payer: Medicare HMO

## 2022-06-17 ENCOUNTER — Ambulatory Visit (INDEPENDENT_AMBULATORY_CARE_PROVIDER_SITE_OTHER): Payer: Medicare HMO | Admitting: Physician Assistant

## 2022-06-17 DIAGNOSIS — M25561 Pain in right knee: Secondary | ICD-10-CM

## 2022-06-17 NOTE — Progress Notes (Signed)
Office Visit Note   Patient: [name]           Date of Birth: 09/25/1925           MRN: [id number] Visit Date: 06/17/2022              Requested by: Renaldo Reel, DO [address] [place],  [place] [address] PCP: Renaldo Reel, DO  Chief Complaint  Patient presents with   Right Knee - Pain      HPI: [name] is a longtime patient of Dr. Rudene Anda.  She is accompanied by her daughter today.  She is status post left knee replacement by Dr. Gerhard Perches.  She is also status post right knee replacement which unfortunately had sequential infections and replacement was removed and a spacer was placed.  She has done well with this over the years.  About 3 days ago she had a fall onto her knees.  She did not really have much swelling or ecchymosis.  She was seen at a outside facility.  She uses a walker right now to ambulate.  Normally she ambulates independently.  Assessment & Plan: Visit Diagnoses:  1. Acute pain of right knee     Plan: I do not see any acute fractures and I did compare x-rays of her right knee with previous ones done in 2022.  She does not have any effusion no ecchymosis has stiffness in the knee which is her baseline.  Had a discussion with her and her daughter.  Would like for her to come back and see me in a couple weeks to make sure she is doing okay.  She is homebound and lives alone.  I do think she would benefit from physical therapy for a home safety inspection as well as balance training and fall prevention to prevent future falls.  Follow-Up Instructions: No follow-ups on file.   Ortho Exam  Patient is alert, oriented, no adenopathy, well-dressed, normal affect, normal respiratory effort. Examination of the right knee she has no effusion no redness no erythema.  She does not quite come to full extension her compartments are soft and nontender.  No real tenderness in 1 particular area to palpation neurovascular intact  Imaging: No  results found. No images are attached to the encounter.  Labs: Lab Results  Component Value Date   HGBA1C (H) 07/06/2009    6.6 (NOTE) The ADA recommends the following therapeutic goal for glycemic control related to Hgb A1c measurement: Goal of therapy: <6.5 Hgb A1c  Reference: American Diabetes Association: Clinical Practice Recommendations 2010, Diabetes Care, 2010, 33: (Suppl  1).   ESRSEDRATE 19 08/11/2021   ESRSEDRATE 19 04/08/2018   ESRSEDRATE 19 06/21/2017   CRP 1.5 08/11/2021   CRP 3.6 04/08/2018   CRP 3.3 06/21/2017   REPTSTATUS 07/18/2013 FINAL 07/17/2013   GRAMSTAIN  03/15/2013    FEW WBC PRESENT,BOTH PMN AND MONONUCLEAR NO SQUAMOUS EPITHELIAL CELLS SEEN NO ORGANISMS SEEN Performed at [facility]  03/15/2013    FEW WBC PRESENT,BOTH PMN AND MONONUCLEAR NO SQUAMOUS EPITHELIAL CELLS SEEN NO ORGANISMS SEEN Performed at [facility]   CULT NO GROWTH Performed at [facility] 07/17/2013   LABORGA STAPHYLOCOCCUS SPECIES (COAGULASE NEGATIVE) 03/15/2013     Lab Results  Component Value Date   ALBUMIN 3.7 07/17/2013   ALBUMIN 4.2 05/10/2013   ALBUMIN 3.7 04/11/2013    Lab Results  Component Value Date   MG  2.0 07/07/2009   No results found for: "VD25OH"  No results found for: "PREALBUMIN"    Latest Ref Rng & Units 08/11/2021   11:33 AM 11/03/2018   10:14 PM 04/08/2018   11:47 AM  CBC EXTENDED  WBC 3.8 - 10.8 Thousand/uL 6.2  8.3  6.0   RBC 3.80 - 5.10 Million/uL 5.05  5.10  4.96   Hemoglobin 11.7 - 15.5 g/dL 15.5  15.7  14.8   HCT 35.0 - 45.0 % 45.2  46.2  44.0   Platelets 140 - 400 Thousand/uL 227  232  232   NEUT# 1,500 - 7,800 cells/uL 4,272   3,924   Lymph# 850 - 3,900 cells/uL 1,469   1,488      There is no height or weight on file to calculate BMI.  Orders:  Orders Placed This Encounter  Procedures   XR KNEE 3 VIEW RIGHT   No orders of the defined types were placed in this encounter.    Procedures: No  procedures performed  Clinical Data: No additional findings.  ROS:  All other systems negative, except as noted in the HPI. Review of Systems  Objective: Vital Signs: There were no vitals taken for this visit.  Specialty Comments:  No specialty comments available.  PMFS History: Patient Active Problem List   Diagnosis Date Noted   Pain in left hip 12/30/2021   Pain in right knee 08/11/2021   Trochanteric bursitis, left hip 05/16/2019   Rotator cuff arthropathy of left shoulder 04/27/2019   Chronic left shoulder pain 06/01/2018   Pain in limb-Left calf 05/19/2013   HTN (hypertension) 03/17/2013   Iron deficiency anemia 03/17/2013   HTN (hypertension), malignant 03/17/2013   Urinary retention 03/17/2013   Infection of total joint prosthesis ([place]) 03/16/2013   Infection of total knee replacement ([place]) 03/15/2013   Injury of popliteal artery 09/23/2012   Aftercare following surgery of the circulatory system, NEC 09/23/2012   Peripheral vascular disease, unspecified ([place]) 09/23/2012   Muscle soreness-Right popliteal 09/23/2012   Septic arthritis ([place]) 05/18/2012   Coagulase-negative staphylococcal infection 05/18/2012   Past Medical History:  Diagnosis Date   Anxiety    Arthritis    "joints" (03/16/2013)   Breast cancer ([place])    Bronchitis    Chronic bronchitis ([place])    "yearly for the last 3-4 years" (123456)   Complication of anesthesia    Diverticulosis    GERD (gastroesophageal reflux disease)    Hiatal hernia    High cholesterol    Hypertension    Hypothyroidism    PONV (postoperative nausea and vomiting)    S/P knee surgery    right knee surgery on 12-18   Thyroid disease    Uterine cancer ([place])     Family History  Problem Relation Age of Onset   Diabetes Mother    Heart disease Father    Stroke Father    Hypertension Father    Heart attack Father    Cancer Sister    Diabetes Sister    Hyperlipidemia Sister    Hypertension Sister    Cancer  Brother    Diabetes Brother    Hypertension Brother    Hyperlipidemia Daughter     Past Surgical History:  Procedure Laterality Date   ABDOMINAL HYSTERECTOMY     "left an ovary" (03/16/2013)   APPENDECTOMY     BREAST BIOPSY Left    EYE SURGERY     GLAUCOMA SURGERY Bilateral 03/01/2013-03/08/2013   I & D EXTREMITY Right  03/15/2013   Procedure: Exploration right total knee replacement , removal of components, and insertion of antibiotic spacer.;  Surgeon: Garald Balding, MD;  Location: [facility];  Service: Orthopedics;  Laterality: Right;   I & D KNEE WITH POLY EXCHANGE  03/08/2012   Procedure: IRRIGATION AND DEBRIDEMENT KNEE WITH POLY EXCHANGE;  Surgeon: Garald Balding, MD;  Location: [facility];  Service: Orthopedics;  Laterality: Right;  Right Total Knee Irrigation & Debridement, Synovectomy, Poly Exchange, Placement of Antibiotic Beads   JOINT REPLACEMENT Right Dec. 10, 2013   Knee   JOINT REPLACEMENT Right Dec. 17, 2014   Knee I & D   MASTECTOMY Left ~ 2005   [place] Right 01/16/10   popliteal artery repair   TOTAL KNEE  PROSTHESIS REMOVAL W/ SPACER INSERTION Right 03/15/2013   TOTAL KNEE ARTHROPLASTY Left 2007   TOTAL KNEE ARTHROPLASTY Right 01/17/2011   Social History   Occupational History   Not on file  Tobacco Use   Smoking status: Never   Smokeless tobacco: Never  Vaping Use   Vaping Use: Never used  Substance and Sexual Activity   Alcohol use: No    Alcohol/week: 0.0 standard drinks of alcohol   Drug use: No   Sexual activity: Not Currently

## 2022-06-24 ENCOUNTER — Telehealth: Payer: Self-pay | Admitting: Physician Assistant

## 2022-06-24 NOTE — Telephone Encounter (Signed)
Chris (PT) from [facility] called requesting verbal orders for home health physical therapy 2wk 4 and 1wk 4. Gerald St[employer] secure number is [phone]. If unable to answer leave detailed message

## 2022-06-24 NOTE — Telephone Encounter (Signed)
Called and gave verbal ok

## 2022-07-01 ENCOUNTER — Ambulatory Visit (INDEPENDENT_AMBULATORY_CARE_PROVIDER_SITE_OTHER): Payer: Medicare HMO | Admitting: Physician Assistant

## 2022-07-01 ENCOUNTER — Encounter: Payer: Self-pay | Admitting: Physician Assistant

## 2022-07-01 DIAGNOSIS — M25561 Pain in right knee: Secondary | ICD-10-CM | POA: Diagnosis not present

## 2022-07-01 NOTE — Progress Notes (Signed)
Office Visit Note   Patient: [name]           Date of Birth: [birth year]           MRN: [id number] Visit Date: 07/01/2022              Requested by: Renaldo Reel, DO [address] [place],  [place] [address] PCP: Renaldo Reel, DO  Chief Complaint  Patient presents with   Right Knee - Follow-up      HPI: [name] is a pleasant 89 year old woman who follows up today for her right knee pain.  She does have an extensive history of joint replacement and infection and currently has a spacer in that right knee.  She did sustain a fall.  I did give her an injection in her troches bursa which she says helped but did not help her knee.  She is back to driving.  Assessment & Plan: Visit Diagnoses:  1. Acute pain of right knee     Plan: I reviewed her x-rays I do not see anything acute between most recent x-rays and x-rays taken previously.  She is ambulating without the assistance of a cane.  Her exam shows just some tenderness over the medial side of the right knee.  I did tell her we can get a CAT scan to see if there was something occult that I could not image.  Alternatively she could try some physical therapy and follow-up with me in a month.  She would like to do this we will follow-up at that time  Follow-Up Instructions: Return in about 1 month (around 07/31/2022).   Ortho Exam  Patient is alert, oriented, no adenopathy, well-dressed, normal affect, normal respiratory effort. Examination of her right knee no effusion no redness no erythema she is able to sustain a straight leg raise.  She has lacks a degree or 2 of extension flexion to 95 degrees no specific tenderness or ecchymosis today compartments are soft and nontender  Imaging: No results found. No images are attached to the encounter.  Labs: Lab Results  Component Value Date   HGBA1C (H) 07/06/2009    6.6 (NOTE) The ADA recommends the following therapeutic goal for glycemic control related to  Hgb A1c measurement: Goal of therapy: <6.5 Hgb A1c  Reference: American Diabetes Association: Clinical Practice Recommendations 2010, Diabetes Care, 2010, 33: (Suppl  1).   ESRSEDRATE 19 08/11/2021   ESRSEDRATE 19 04/08/2018   ESRSEDRATE 19 06/21/2017   CRP 1.5 08/11/2021   CRP 3.6 04/08/2018   CRP 3.3 06/21/2017   REPTSTATUS 07/18/2013 FINAL 07/17/2013   GRAMSTAIN  03/15/2013    FEW WBC PRESENT,BOTH PMN AND MONONUCLEAR NO SQUAMOUS EPITHELIAL CELLS SEEN NO ORGANISMS SEEN Performed at [facility]  03/15/2013    FEW WBC PRESENT,BOTH PMN AND MONONUCLEAR NO SQUAMOUS EPITHELIAL CELLS SEEN NO ORGANISMS SEEN Performed at [facility]   CULT NO GROWTH Performed at [facility] 07/17/2013   LABORGA STAPHYLOCOCCUS SPECIES (COAGULASE NEGATIVE) 03/15/2013     Lab Results  Component Value Date   ALBUMIN 3.7 07/17/2013   ALBUMIN 4.2 05/10/2013   ALBUMIN 3.7 04/11/2013    Lab Results  Component Value Date   MG 2.0 07/07/2009   No results found for: "VD25OH"  No results found for: "PREALBUMIN"    Latest Ref Rng & Units 08/11/2021   11:33 AM 11/03/2018   10:14 PM 04/08/2018   11:47 AM  CBC EXTENDED  WBC 3.8 - 10.8 Thousand/uL 6.2  8.3  6.0   RBC 3.80 - 5.10 Million/uL 5.05  5.10  4.96   Hemoglobin 11.7 - 15.5 g/dL 15.5  15.7  14.8   HCT 35.0 - 45.0 % 45.2  46.2  44.0   Platelets 140 - 400 Thousand/uL 227  232  232   NEUT# 1,500 - 7,800 cells/uL 4,272   3,924   Lymph# 850 - 3,900 cells/uL 1,469   1,488      There is no height or weight on file to calculate BMI.  Orders:  Orders Placed This Encounter  Procedures   Ambulatory referral to Physical Therapy   No orders of the defined types were placed in this encounter.    Procedures: No procedures performed  Clinical Data: No additional findings.  ROS:  All other systems negative, except as noted in the HPI. Review of Systems  Objective: Vital Signs: There were no vitals taken  for this visit.  Specialty Comments:  No specialty comments available.  PMFS History: Patient Active Problem List   Diagnosis Date Noted   Pain in left hip 12/30/2021   Pain in right knee 08/11/2021   Trochanteric bursitis, left hip 05/16/2019   Rotator cuff arthropathy of left shoulder 04/27/2019   Chronic left shoulder pain 06/01/2018   Pain in limb-Left calf 05/19/2013   HTN (hypertension) 03/17/2013   Iron deficiency anemia 03/17/2013   HTN (hypertension), malignant 03/17/2013   Urinary retention 03/17/2013   Infection of total joint prosthesis 03/16/2013   Infection of total knee replacement 03/15/2013   Injury of popliteal artery 09/23/2012   Aftercare following surgery of the circulatory system, NEC 09/23/2012   Peripheral vascular disease, unspecified 09/23/2012   Muscle soreness-Right popliteal 09/23/2012   Septic arthritis 05/18/2012   Coagulase-negative staphylococcal infection 05/18/2012   Past Medical History:  Diagnosis Date   Anxiety    Arthritis    "joints" (03/16/2013)   Breast cancer    Bronchitis    Chronic bronchitis    "yearly for the last 3-4 years" (123456)   Complication of anesthesia    Diverticulosis    GERD (gastroesophageal reflux disease)    Hiatal hernia    High cholesterol    Hypertension    Hypothyroidism    PONV (postoperative nausea and vomiting)    S/P knee surgery    right knee surgery on 12-18   Thyroid disease    Uterine cancer     Family History  Problem Relation Age of Onset   Diabetes Mother    Heart disease Father    Stroke Father    Hypertension Father    Heart attack Father    Cancer Sister    Diabetes Sister    Hyperlipidemia Sister    Hypertension Sister    Cancer Brother    Diabetes Brother    Hypertension Brother    Hyperlipidemia Daughter     Past Surgical History:  Procedure Laterality Date   ABDOMINAL HYSTERECTOMY     "left an ovary" (03/16/2013)   APPENDECTOMY     BREAST BIOPSY Left    EYE  SURGERY     GLAUCOMA SURGERY Bilateral 03/01/2013-03/08/2013   I & D EXTREMITY Right 03/15/2013   Procedure: Exploration right total knee replacement , removal of components, and insertion of antibiotic spacer.;  Surgeon: Garald Balding, MD;  Location: [facility];  Service: Orthopedics;  Laterality: Right;   I & D KNEE WITH POLY EXCHANGE  03/08/2012  Procedure: IRRIGATION AND DEBRIDEMENT KNEE WITH POLY EXCHANGE;  Surgeon: Garald Balding, MD;  Location: [facility];  Service: Orthopedics;  Laterality: Right;  Right Total Knee Irrigation & Debridement, Synovectomy, Poly Exchange, Placement of Antibiotic Beads   JOINT REPLACEMENT Right Dec. 10, 2013   Knee   JOINT REPLACEMENT Right Dec. 17, 2014   Knee I & D   MASTECTOMY Left ~ 2005   [place] Right 01/16/10   popliteal artery repair   TOTAL KNEE  PROSTHESIS REMOVAL W/ SPACER INSERTION Right 03/15/2013   TOTAL KNEE ARTHROPLASTY Left 2007   TOTAL KNEE ARTHROPLASTY Right 01/17/2011   Social History   Occupational History   Not on file  Tobacco Use   Smoking status: Never   Smokeless tobacco: Never  Vaping Use   Vaping Use: Never used  Substance and Sexual Activity   Alcohol use: No    Alcohol/week: 0.0 standard drinks of alcohol   Drug use: No   Sexual activity: Not Currently

## 2022-07-17 ENCOUNTER — Telehealth: Payer: Self-pay | Admitting: Orthopedic Surgery

## 2022-07-17 NOTE — Telephone Encounter (Signed)
[facility] advising patient had a missed P/T session today. Will resume next week.

## 2022-07-17 NOTE — Telephone Encounter (Signed)
Noted  

## 2022-07-31 ENCOUNTER — Encounter: Payer: Self-pay | Admitting: Physician Assistant

## 2022-07-31 ENCOUNTER — Ambulatory Visit (INDEPENDENT_AMBULATORY_CARE_PROVIDER_SITE_OTHER): Payer: Medicare HMO | Admitting: Physician Assistant

## 2022-07-31 DIAGNOSIS — G8929 Other chronic pain: Secondary | ICD-10-CM | POA: Diagnosis not present

## 2022-07-31 DIAGNOSIS — M25561 Pain in right knee: Secondary | ICD-10-CM | POA: Diagnosis not present

## 2022-07-31 NOTE — Progress Notes (Signed)
Office Visit Note   Patient: [name]           Date of Birth: 12-04-1925           MRN: [id number] Visit Date: 07/31/2022              Requested by: Donata Duff, DO [address] [place],  [place] [address] PCP: Donata Duff, DO  Chief Complaint  Patient presents with   Right Knee - Follow-up      HPI: [name] comes in today for follow-up of her right knee.  She is now driving and walking with a cane.  She is gone back to bowling.  She still has some pain.  She is status post infected total knee replacement many years ago that was replaced with a spacer.  Denies any fever chills had a recent fall.  She has been doing home physical therapy rates her pain in her knee is mild to moderate  Assessment & Plan: Visit Diagnoses: Right knee pain  Plan: She asked about a shot I told her that would not be recommended in this knee considering its history.  I have given her troches bursa injections before and she does not have any problems with that today.  She should complete her physical therapy continue home exercise program can use Tylenol and topical Voltaren.  May follow-up with me as needed  Follow-Up Instructions: No follow-ups on file.   Ortho Exam  Patient is alert, oriented, no adenopathy, well-dressed, normal affect, normal respiratory effort. Right knee no effusion no erythema compartments are soft and nontender she is neurovascular intact she has good extension and flexor mechanisms.  Imaging: No results found. No images are attached to the encounter.  Labs: Lab Results  Component Value Date   HGBA1C (H) 07/06/2009    6.6 (NOTE) The ADA recommends the following therapeutic goal for glycemic control related to Hgb A1c measurement: Goal of therapy: <6.5 Hgb A1c  Reference: American Diabetes Association: Clinical Practice Recommendations 2010, Diabetes Care, 2010, 33: (Suppl  1).   ESRSEDRATE 19 08/11/2021   ESRSEDRATE 19 04/08/2018    ESRSEDRATE 19 06/21/2017   CRP 1.5 08/11/2021   CRP 3.6 04/08/2018   CRP 3.3 06/21/2017   REPTSTATUS 07/18/2013 FINAL 07/17/2013   GRAMSTAIN  03/15/2013    FEW WBC PRESENT,BOTH PMN AND MONONUCLEAR NO SQUAMOUS EPITHELIAL CELLS SEEN NO ORGANISMS SEEN Performed at [facility]   GRAMSTAIN  03/15/2013    FEW WBC PRESENT,BOTH PMN AND MONONUCLEAR NO SQUAMOUS EPITHELIAL CELLS SEEN NO ORGANISMS SEEN Performed at [facility]   CULT NO GROWTH Performed at [facility] 07/17/2013   LABORGA STAPHYLOCOCCUS SPECIES (COAGULASE NEGATIVE) 03/15/2013     Lab Results  Component Value Date   ALBUMIN 3.7 07/17/2013   ALBUMIN 4.2 05/10/2013   ALBUMIN 3.7 04/11/2013    Lab Results  Component Value Date   MG 2.0 07/07/2009   No results found for: "VD25OH"  No results found for: "PREALBUMIN"    Latest Ref Rng & Units 08/11/2021   11:33 AM 11/03/2018   10:14 PM 04/08/2018   11:47 AM  CBC EXTENDED  WBC 3.8 - 10.8 Thousand/uL 6.2  8.3  6.0   RBC 3.80 - 5.10 Million/uL 5.05  5.10  4.96   Hemoglobin 11.7 - 15.5 g/dL 78.2  95.6  21.3   HCT 35.0 - 45.0 % 45.2  46.2  44.0   Platelets 140 - 400 Thousand/uL  227  232  232   NEUT# 1,500 - 7,800 cells/uL 4,272   3,924   Lymph# 850 - 3,900 cells/uL 1,469   1,488      There is no height or weight on file to calculate BMI.  Orders:  No orders of the defined types were placed in this encounter.  No orders of the defined types were placed in this encounter.    Procedures: No procedures performed  Clinical Data: No additional findings.  ROS:  All other systems negative, except as noted in the HPI. Review of Systems  Objective: Vital Signs: There were no vitals taken for this visit.  Specialty Comments:  No specialty comments available.  PMFS History: Patient Active Problem List   Diagnosis Date Noted   Pain in left hip 12/30/2021   Pain in right knee 08/11/2021   Trochanteric bursitis, left hip 05/16/2019    Rotator cuff arthropathy of left shoulder 04/27/2019   Chronic left shoulder pain 06/01/2018   Pain in limb-Left calf 05/19/2013   HTN (hypertension) 03/17/2013   Iron deficiency anemia 03/17/2013   HTN (hypertension), malignant 03/17/2013   Urinary retention 03/17/2013   Infection of total joint prosthesis (HCC) 03/16/2013   Infection of total knee replacement (HCC) 03/15/2013   Injury of popliteal artery 09/23/2012   Aftercare following surgery of the circulatory system, NEC 09/23/2012   Peripheral vascular disease, unspecified (HCC) 09/23/2012   Muscle soreness-Right popliteal 09/23/2012   Septic arthritis (HCC) 05/18/2012   Coagulase-negative staphylococcal infection 05/18/2012   Past Medical History:  Diagnosis Date   Anxiety    Arthritis    "joints" (03/16/2013)   Breast cancer (HCC)    Bronchitis    Chronic bronchitis (HCC)    "yearly for the last 3-4 years" (03/16/2013)   Complication of anesthesia    Diverticulosis    GERD (gastroesophageal reflux disease)    Hiatal hernia    High cholesterol    Hypertension    Hypothyroidism    PONV (postoperative nausea and vomiting)    S/P knee surgery    right knee surgery on 12-18   Thyroid disease    Uterine cancer (HCC)     Family History  Problem Relation Age of Onset   Diabetes Mother    Heart disease Father    Stroke Father    Hypertension Father    Heart attack Father    Cancer Sister    Diabetes Sister    Hyperlipidemia Sister    Hypertension Sister    Cancer Brother    Diabetes Brother    Hypertension Brother    Hyperlipidemia Daughter     Past Surgical History:  Procedure Laterality Date   ABDOMINAL HYSTERECTOMY     "left an ovary" (03/16/2013)   APPENDECTOMY     BREAST BIOPSY Left    EYE SURGERY     GLAUCOMA SURGERY Bilateral 03/01/2013-03/08/2013   I & D EXTREMITY Right 03/15/2013   Procedure: Exploration right total knee replacement , removal of components, and insertion of antibiotic spacer.;   Surgeon: Valeria Batman, MD;  Location: [facility] OR;  Service: Orthopedics;  Laterality: Right;   I & D KNEE WITH POLY EXCHANGE  03/08/2012   Procedure: IRRIGATION AND DEBRIDEMENT KNEE WITH POLY EXCHANGE;  Surgeon: Valeria Batman, MD;  Location: [facility] OR;  Service: Orthopedics;  Laterality: Right;  Right Total Knee Irrigation & Debridement, Synovectomy, Poly Exchange, Placement of Antibiotic Beads   JOINT REPLACEMENT Right Dec. 10, 2013   Knee  JOINT REPLACEMENT Right Dec. 17, 2014   Knee I & D   MASTECTOMY Left ~ 2005   PATCH ANGIOPLASTY Right 01/16/10   popliteal artery repair   TOTAL KNEE  PROSTHESIS REMOVAL W/ SPACER INSERTION Right 03/15/2013   TOTAL KNEE ARTHROPLASTY Left 2007   TOTAL KNEE ARTHROPLASTY Right 01/17/2011   Social History   Occupational History   Not on file  Tobacco Use   Smoking status: Never   Smokeless tobacco: Never  Vaping Use   Vaping Use: Never used  Substance and Sexual Activity   Alcohol use: No    Alcohol/week: 0.0 standard drinks of alcohol   Drug use: No   Sexual activity: Not Currently

## 2023-12-01 ENCOUNTER — Other Ambulatory Visit: Payer: Self-pay

## 2023-12-01 ENCOUNTER — Ambulatory Visit (INDEPENDENT_AMBULATORY_CARE_PROVIDER_SITE_OTHER): Admitting: Physician Assistant

## 2023-12-01 DIAGNOSIS — M25561 Pain in right knee: Secondary | ICD-10-CM

## 2023-12-01 DIAGNOSIS — G8929 Other chronic pain: Secondary | ICD-10-CM

## 2023-12-01 NOTE — Progress Notes (Unsigned)
 Office Visit Note   Patient: [name]           Date of Birth: Oct 29, 1925           MRN: [id number] Visit Date: 12/01/2023              Requested by: Briant Steffan NOVAK, DO [address] [place],  [place] [address] PCP: Briant Steffan NOVAK, DO  No chief complaint on file.     HPI: Patient is a pleasant 89 year old woman who is well-known to me.  She is status post knee replacement by Dr. Anderson over 10 years ago.  Unfortunately she sustained an infection and had explant with long long course of antibiotics.  She still is quite active and enjoys bowling.  She denies any fever chills or recent injury but has had the return of some pain in her right knee.  Assessment & Plan: Visit Diagnoses: Right knee pain  Plan: I had a long discussion with her and her family.  She is not eligible for an injection because of her history of severe infection in this knee.  She did do well this time a year and a half ago when she had similar symptoms and she underwent physical therapy we will arrange for home health care with physical therapy as she is homebound.  Also she is going to look for her brace and see if it still fits her that might help her with just for a little while.  There is absolutely no concern for infection  Follow-Up Instructions: No follow-ups on file.   Ortho Exam  Patient is alert, oriented, no adenopathy, well-dressed, normal affect, normal respiratory effort. Right knee no erythema compartments are soft and compressible she is neurovascular intact no effusion good range of motion    Imaging: No results found. No images are attached to the encounter.  Labs: Lab Results  Component Value Date   HGBA1C (H) 07/06/2009    6.6 (NOTE) The ADA recommends the following therapeutic goal for glycemic control related to Hgb A1c measurement: Goal of therapy: <6.5 Hgb A1c  Reference: American Diabetes Association: Clinical Practice Recommendations 2010, Diabetes  Care, 2010, 33: (Suppl  1).   ESRSEDRATE 19 08/11/2021   ESRSEDRATE 19 04/08/2018   ESRSEDRATE 19 06/21/2017   CRP 1.5 08/11/2021   CRP 3.6 04/08/2018   CRP 3.3 06/21/2017   REPTSTATUS 07/18/2013 FINAL 07/17/2013   GRAMSTAIN  03/15/2013    FEW WBC PRESENT,BOTH PMN AND MONONUCLEAR NO SQUAMOUS EPITHELIAL CELLS SEEN NO ORGANISMS SEEN Performed at [facility]   GRAMSTAIN  03/15/2013    FEW WBC PRESENT,BOTH PMN AND MONONUCLEAR NO SQUAMOUS EPITHELIAL CELLS SEEN NO ORGANISMS SEEN Performed at [facility]   CULT NO GROWTH Performed at [facility] 07/17/2013   LABORGA STAPHYLOCOCCUS SPECIES (COAGULASE NEGATIVE) 03/15/2013     Lab Results  Component Value Date   ALBUMIN 3.7 07/17/2013   ALBUMIN 4.2 05/10/2013   ALBUMIN 3.7 04/11/2013    Lab Results  Component Value Date   MG 2.0 07/07/2009   No results found for: VD25OH  No results found for: PREALBUMIN    Latest Ref Rng & Units 08/11/2021   11:33 AM 11/03/2018   10:14 PM 04/08/2018   11:47 AM  CBC EXTENDED  WBC 3.8 - 10.8 Thousand/uL 6.2  8.3  6.0   RBC 3.80 - 5.10 Million/uL 5.05  5.10  4.96   Hemoglobin 11.7 - 15.5 g/dL 84.4  15.7  14.8   HCT 35.0 - 45.0 % 45.2  46.2  44.0   Platelets 140 - 400 Thousand/uL 227  232  232   NEUT# 1,500 - 7,800 cells/uL 4,272   3,924   Lymph# 850 - 3,900 cells/uL 1,469   1,488      There is no height or weight on file to calculate BMI.  Orders:  No orders of the defined types were placed in this encounter.  No orders of the defined types were placed in this encounter.    Procedures: No procedures performed  Clinical Data: No additional findings.  ROS:  All other systems negative, except as noted in the HPI. Review of Systems  Objective: Vital Signs: There were no vitals taken for this visit.  Specialty Comments:  No specialty comments available.  PMFS History: Patient Active Problem List   Diagnosis Date Noted   Pain in left hip  12/30/2021   Pain in right knee 08/11/2021   Trochanteric bursitis, left hip 05/16/2019   Rotator cuff arthropathy of left shoulder 04/27/2019   Chronic left shoulder pain 06/01/2018   Pain in limb-Left calf 05/19/2013   HTN (hypertension) 03/17/2013   Iron deficiency anemia 03/17/2013   HTN (hypertension), malignant 03/17/2013   Urinary retention 03/17/2013   Infection of total joint prosthesis (HCC) 03/16/2013   Infection of total knee replacement (HCC) 03/15/2013   Injury of popliteal artery 09/23/2012   Aftercare following surgery of the circulatory system, NEC 09/23/2012   Peripheral vascular disease, unspecified (HCC) 09/23/2012   Muscle soreness-Right popliteal 09/23/2012   Septic arthritis (HCC) 05/18/2012   Coagulase-negative staphylococcal infection 05/18/2012   Past Medical History:  Diagnosis Date   Anxiety    Arthritis    joints (03/16/2013)   Breast cancer (HCC)    Bronchitis    Chronic bronchitis (HCC)    yearly for the last 3-4 years (03/16/2013)   Complication of anesthesia    Diverticulosis    GERD (gastroesophageal reflux disease)    Hiatal hernia    High cholesterol    Hypertension    Hypothyroidism    PONV (postoperative nausea and vomiting)    S/P knee surgery    right knee surgery on 12-18   Thyroid disease    Uterine cancer (HCC)     Family History  Problem Relation Age of Onset   Diabetes Mother    Heart disease Father    Stroke Father    Hypertension Father    Heart attack Father    Cancer Sister    Diabetes Sister    Hyperlipidemia Sister    Hypertension Sister    Cancer Brother    Diabetes Brother    Hypertension Brother    Hyperlipidemia Daughter     Past Surgical History:  Procedure Laterality Date   ABDOMINAL HYSTERECTOMY     left an ovary (03/16/2013)   APPENDECTOMY     BREAST BIOPSY Left    EYE SURGERY     GLAUCOMA SURGERY Bilateral 03/01/2013-03/08/2013   I & D EXTREMITY Right 03/15/2013   Procedure: Exploration  right total knee replacement , removal of components, and insertion of antibiotic spacer.;  Surgeon: Maude LELON Right, MD;  Location: [facility] OR;  Service: Orthopedics;  Laterality: Right;   I & D KNEE WITH POLY EXCHANGE  03/08/2012   Procedure: IRRIGATION AND DEBRIDEMENT KNEE WITH POLY EXCHANGE;  Surgeon: Maude LELON Right, MD;  Location: [facility] OR;  Service: Orthopedics;  Laterality: Right;  Right Total Knee Irrigation & Debridement,  Synovectomy, Poly Exchange, Placement of Antibiotic Beads   JOINT REPLACEMENT Right Dec. 10, 2013   Knee   JOINT REPLACEMENT Right Dec. 17, 2014   Knee I & D   MASTECTOMY Left ~ 2005   PATCH ANGIOPLASTY Right 01/16/10   popliteal artery repair   TOTAL KNEE  PROSTHESIS REMOVAL W/ SPACER INSERTION Right 03/15/2013   TOTAL KNEE ARTHROPLASTY Left 2007   TOTAL KNEE ARTHROPLASTY Right 01/17/2011   Social History   Occupational History   Not on file  Tobacco Use   Smoking status: Never   Smokeless tobacco: Never  Vaping Use   Vaping status: Never Used  Substance and Sexual Activity   Alcohol use: No    Alcohol/week: 0.0 standard drinks of alcohol   Drug use: No   Sexual activity: Not Currently

## 2023-12-02 ENCOUNTER — Encounter: Payer: Self-pay | Admitting: Physician Assistant

## 2023-12-03 ENCOUNTER — Telehealth: Payer: Self-pay

## 2023-12-03 NOTE — Telephone Encounter (Signed)
 [name] with [facility] called to let our office know that they are trying to contact this patient to get her set up with home health. Stated that they would continue to try to contact her, but wanted to give us  an update.

## 2023-12-06 ENCOUNTER — Telehealth: Payer: Self-pay

## 2023-12-06 NOTE — Telephone Encounter (Signed)
 Spoke to patient and she states [facility] came to visit her and spoke to her and left her a booklet

## 2023-12-07 ENCOUNTER — Telehealth: Payer: Self-pay | Admitting: Physician Assistant

## 2023-12-07 NOTE — Telephone Encounter (Signed)
 Bernadette (PT) from [facility] called requesting verbal orders for 2wk1, 1wk 6. Bernadette secure number is [phone].

## 2023-12-21 NOTE — Telephone Encounter (Signed)
 This is a Dr. Ricarda Persons pt. I wouldn't be able to assist with this.

## 2023-12-21 NOTE — Telephone Encounter (Signed)
 I called Bernadette and LM advising orders ok.

## 2024-01-31 ENCOUNTER — Encounter: Payer: Self-pay | Admitting: Radiology
